# Patient Record
Sex: Female | Born: 1970 | Race: White | Hispanic: No | Smoking: Former smoker
Health system: Southern US, Community
[De-identification: ages and names within clinical notes are randomized; demographics above are authoritative.]

## PROBLEM LIST (undated history)

## (undated) DIAGNOSIS — F1020 Alcohol dependence, uncomplicated: Secondary | ICD-10-CM

## (undated) DIAGNOSIS — F32A Depression, unspecified: Secondary | ICD-10-CM

## (undated) DIAGNOSIS — G8929 Other chronic pain: Secondary | ICD-10-CM

## (undated) DIAGNOSIS — T7840XA Allergy, unspecified, initial encounter: Secondary | ICD-10-CM

## (undated) DIAGNOSIS — F329 Major depressive disorder, single episode, unspecified: Secondary | ICD-10-CM

## (undated) DIAGNOSIS — R519 Headache, unspecified: Secondary | ICD-10-CM

## (undated) DIAGNOSIS — J3489 Other specified disorders of nose and nasal sinuses: Secondary | ICD-10-CM

## (undated) DIAGNOSIS — I10 Essential (primary) hypertension: Secondary | ICD-10-CM

## (undated) DIAGNOSIS — F419 Anxiety disorder, unspecified: Secondary | ICD-10-CM

## (undated) DIAGNOSIS — F4310 Post-traumatic stress disorder, unspecified: Secondary | ICD-10-CM

## (undated) DIAGNOSIS — F1910 Other psychoactive substance abuse, uncomplicated: Secondary | ICD-10-CM

## (undated) DIAGNOSIS — C4491 Basal cell carcinoma of skin, unspecified: Secondary | ICD-10-CM

## (undated) HISTORY — PX: COLON SURGERY: SHX602

## (undated) HISTORY — DX: Alcohol dependence, uncomplicated: F10.20

## (undated) HISTORY — PX: BREAST BIOPSY: SHX20

## (undated) HISTORY — DX: Basal cell carcinoma of skin, unspecified: C44.91

## (undated) HISTORY — DX: Essential (primary) hypertension: I10

## (undated) HISTORY — DX: Anxiety disorder, unspecified: F41.9

## (undated) HISTORY — DX: Depression, unspecified: F32.A

## (undated) HISTORY — DX: Major depressive disorder, single episode, unspecified: F32.9

## (undated) HISTORY — DX: Other specified disorders of nose and nasal sinuses: J34.89

## (undated) HISTORY — DX: Allergy, unspecified, initial encounter: T78.40XA

## (undated) HISTORY — DX: Other psychoactive substance abuse, uncomplicated: F19.10

## (undated) HISTORY — DX: Other chronic pain: G89.29

## (undated) HISTORY — DX: Post-traumatic stress disorder, unspecified: F43.10

## (undated) HISTORY — DX: Headache, unspecified: R51.9

## (undated) HISTORY — PX: WISDOM TOOTH EXTRACTION: SHX21

---
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
## 2003-04-17 HISTORY — PX: BREAST EXCISIONAL BIOPSY: SUR124

## 2015-02-01 ENCOUNTER — Encounter (HOSPITAL_COMMUNITY): Payer: Self-pay | Admitting: Emergency Medicine

## 2015-02-01 ENCOUNTER — Inpatient Hospital Stay (HOSPITAL_COMMUNITY)
Admission: AD | Admit: 2015-02-01 | Discharge: 2015-02-04 | DRG: 885 | Disposition: A | Discharge status: Home / Self Care | Payer: Federal, State, Local not specified - Other | Attending: Psychiatry | Admitting: Psychiatry

## 2015-02-01 ENCOUNTER — Emergency Department (HOSPITAL_COMMUNITY)
Admission: EM | Admit: 2015-02-01 | Discharge: 2015-02-01 | Disposition: A | Discharge status: Other Acute Inpatient Hospital | Payer: MEDICAID | Attending: Emergency Medicine | Admitting: Emergency Medicine

## 2015-02-01 DIAGNOSIS — Z720 Tobacco use: Secondary | ICD-10-CM | POA: Diagnosis not present

## 2015-02-01 DIAGNOSIS — F17210 Nicotine dependence, cigarettes, uncomplicated: Secondary | ICD-10-CM | POA: Diagnosis present

## 2015-02-01 DIAGNOSIS — F1010 Alcohol abuse, uncomplicated: Secondary | ICD-10-CM | POA: Insufficient documentation

## 2015-02-01 DIAGNOSIS — Z818 Family history of other mental and behavioral disorders: Secondary | ICD-10-CM | POA: Diagnosis not present

## 2015-02-01 DIAGNOSIS — F332 Major depressive disorder, recurrent severe without psychotic features: Principal | ICD-10-CM | POA: Diagnosis present

## 2015-02-01 DIAGNOSIS — F4310 Post-traumatic stress disorder, unspecified: Secondary | ICD-10-CM | POA: Diagnosis not present

## 2015-02-01 DIAGNOSIS — F1014 Alcohol abuse with alcohol-induced mood disorder: Secondary | ICD-10-CM | POA: Diagnosis present

## 2015-02-01 DIAGNOSIS — F4312 Post-traumatic stress disorder, chronic: Secondary | ICD-10-CM | POA: Diagnosis present

## 2015-02-01 DIAGNOSIS — Z3202 Encounter for pregnancy test, result negative: Secondary | ICD-10-CM | POA: Insufficient documentation

## 2015-02-01 DIAGNOSIS — R45851 Suicidal ideations: Secondary | ICD-10-CM

## 2015-02-01 DIAGNOSIS — Y907 Blood alcohol level of 200-239 mg/100 ml: Secondary | ICD-10-CM | POA: Diagnosis present

## 2015-02-01 LAB — CBC
HCT: 43.6 % (ref 36.0–46.0)
Hemoglobin: 14.7 g/dL (ref 12.0–15.0)
MCH: 37.3 pg — ABNORMAL HIGH (ref 26.0–34.0)
MCHC: 33.7 g/dL (ref 30.0–36.0)
MCV: 110.7 fL — ABNORMAL HIGH (ref 78.0–100.0)
Platelets: 175 10*3/uL (ref 150–400)
RBC: 3.94 MIL/uL (ref 3.87–5.11)
RDW: 13.7 % (ref 11.5–15.5)
WBC: 4.7 10*3/uL (ref 4.0–10.5)

## 2015-02-01 LAB — COMPREHENSIVE METABOLIC PANEL
ALT: 56 U/L — ABNORMAL HIGH (ref 14–54)
AST: 131 U/L — ABNORMAL HIGH (ref 15–41)
Albumin: 4.9 g/dL (ref 3.5–5.0)
Alkaline Phosphatase: 75 U/L (ref 38–126)
Anion gap: 8 (ref 5–15)
BUN: 12 mg/dL (ref 6–20)
CO2: 28 mmol/L (ref 22–32)
Calcium: 9.2 mg/dL (ref 8.9–10.3)
Chloride: 104 mmol/L (ref 101–111)
Creatinine, Ser: 0.59 mg/dL (ref 0.44–1.00)
GFR calc Af Amer: >60 mL/min (ref >60)
GFR calc non Af Amer: >60 mL/min (ref >60)
Glucose, Bld: 104 mg/dL — ABNORMAL HIGH (ref 65–99)
Potassium: 4.2 mmol/L (ref 3.5–5.1)
Sodium: 140 mmol/L (ref 135–145)
Total Bilirubin: 0.9 mg/dL (ref 0.3–1.2)
Total Protein: 8.1 g/dL (ref 6.5–8.1)

## 2015-02-01 LAB — RAPID URINE DRUG SCREEN, HOSP PERFORMED
Amphetamines: NOT DETECTED
Barbiturates: NOT DETECTED
Benzodiazepines: NOT DETECTED
Cocaine: NOT DETECTED
Opiates: NOT DETECTED
Tetrahydrocannabinol: NOT DETECTED

## 2015-02-01 LAB — ETHANOL: Alcohol, Ethyl (B): 231 mg/dL — ABNORMAL HIGH (ref <5)

## 2015-02-01 LAB — HCG, QUANTITATIVE, PREGNANCY: hCG, Beta Chain, Quant, S: <1 m[IU]/mL (ref <5)

## 2015-02-01 LAB — ACETAMINOPHEN LEVEL: Acetaminophen (Tylenol), Serum: <10 ug/mL — ABNORMAL LOW (ref 10–30)

## 2015-02-01 LAB — SALICYLATE LEVEL: Salicylate Lvl: <4 mg/dL (ref 2.8–30.0)

## 2015-02-01 MED ORDER — LORAZEPAM 1 MG PO TABS: 0.0000 mg | ORAL_TABLET | Freq: Four times a day (QID) | ORAL | Status: DC

## 2015-02-01 MED ORDER — VITAMIN B-1 100 MG PO TABS: 100.0000 mg | ORAL_TABLET | Freq: Every day | ORAL | Status: DC

## 2015-02-01 MED ORDER — LORAZEPAM 1 MG PO TABS: 0.0000 mg | ORAL_TABLET | Freq: Two times a day (BID) | ORAL | Status: DC

## 2015-02-01 MED ORDER — LORAZEPAM 2 MG/ML IJ SOLN: 0.0000 mg | Freq: Two times a day (BID) | INTRAMUSCULAR | Status: DC

## 2015-02-01 MED ORDER — THIAMINE HCL 100 MG/ML IJ SOLN: 100.0000 mg | Freq: Every day | INTRAMUSCULAR | Status: DC

## 2015-02-01 MED ORDER — LORAZEPAM 2 MG/ML IJ SOLN: 0.0000 mg | Freq: Four times a day (QID) | INTRAMUSCULAR | Status: DC
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
## 2015-02-01 NOTE — ED Notes (Signed)
Pt states that she was having suicidal thoughts and needed medications ref[facility]. States that she does not feel suicidal now after she spoke with the counselor across the street. Alert and oriented. States she had 'half a liter' of wine today.

## 2015-02-01 NOTE — ED Provider Notes (Signed)
Patient initially seen and evaluated by Ivar Drapeob Browning, PA-C, for SI. [facility] results and review of results.   Lab results reviewed and patient is found to be [facility] care [facility].  Elpidio AnisShari Kiev Labrosse, PA-C 02/01/15 2112  [name], MD 02/02/15 2351

## 2015-02-01 NOTE — ED Notes (Signed)
BH called and notified all labs are back  Waiting return call

## 2015-02-01 NOTE — ED Notes (Signed)
[facility] transport called, states it will be about an hour before they can transport the patient.

## 2015-02-01 NOTE — ED Provider Notes (Signed)
CSN: [id number]     Arrival date & time 02/01/15  1825 History  By signing my name below, I, [name], attest that this documentation has been prepared under the direction and in the presence of Roxy Horseman, PA-C. Electronically Signed: [name], ED Scribe. 02/01/2015. 6:41 PM.      Chief Complaint  Patient presents with  . Suicidal   The history is provided by the patient. No language interpreter was used.   HPI Comments: Dawn Jackson is a 44 y.o. female who presents to the Emergency Department complaining of SI onset last night. She reports that she had been drinking about half a liter of wine a day for the past couple of weeks but denies any drug use. She states that her 33 year old daughter was recently kidnapped and could be somewhere in [place] but not exactly sure where. Pt recently moved here a few weeks ago from [place] to be close to family during this time. She states that this is her first time feeling this way.   No past medical history on file. Past Surgical History  Procedure Laterality Date  . Breast biopsy    . Wisdom tooth extraction     No family history on file. Social History  Substance Use Topics  . Smoking status: Current Every Day Smoker  . Smokeless tobacco: Not on file  . Alcohol Use: Yes   OB History    No data available     Review of Systems  Constitutional: Negative for fever and chills.  Respiratory: Negative for shortness of breath.   Cardiovascular: Negative for chest pain.  Gastrointestinal: Negative for nausea, vomiting, diarrhea and constipation.  Genitourinary: Negative for dysuria.  Psychiatric/Behavioral: Positive for suicidal ideas.      Allergies  Review of patient's allergies indicates no known allergies.  Home Medications   Prior to Admission medications   Not on File   BP 133/93 mmHg  Pulse 101  Temp(Src) 98.1 F (36.7 C) (Oral)  Resp 16  SpO2 100% Physical Exam  Constitutional: She is oriented to  person, place, and time. She appears well-developed and well-nourished.  HENT:  Head: Normocephalic and atraumatic.  Eyes: Conjunctivae and EOM are normal. Pupils are equal, round, and reactive to light.  Neck: Normal range of motion. Neck supple.  Cardiovascular: Normal rate and regular rhythm.  Exam reveals no gallop and no friction rub.   No murmur heard. Pulmonary/Chest: Effort normal and breath sounds normal. No respiratory distress. She has no wheezes. She has no rales. She exhibits no tenderness.  Abdominal: Soft. Bowel sounds are normal. She exhibits no distension and no mass. There is no tenderness. There is no rebound and no guarding.  Musculoskeletal: Normal range of motion. She exhibits no edema or tenderness.  Neurological: She is alert and oriented to person, place, and time.  Skin: Skin is warm and dry.  Psychiatric: She has a normal mood and affect. Her behavior is normal. Judgment and thought content normal.  Nursing note and vitals reviewed.   ED Course  Procedures (including critical care time) DIAGNOSTIC STUDIES: Oxygen Saturation is 100% on RA, normal by my interpretation.    COORDINATION OF CARE: 6:41 PM- Pt advised of plan for treatment and pt agrees. Pt has a bed at [facility] and will receive blood work here for medical clearance.    Labs Review Labs Reviewed  COMPREHENSIVE METABOLIC PANEL  ETHANOL  SALICYLATE LEVEL  ACETAMINOPHEN LEVEL  CBC  URINE RAPID DRUG SCREEN,  HOSP PERFORMED  I-STAT BETA HCG BLOOD, ED ([facility], [facility], [facility] ONLY)    Imaging Review No results found. Roxy Horsemanobert Gaelyn Tukes, PA-C has personally reviewed and evaluated these lab results as part of his medical decision-making.   EKG Interpretation None      MDM   Final diagnoses:  None   Patient with suicidal ideation. Medically clear pending reassuring blood work. [facility] Blood work is pending.  Patient will be sent back to behavioral health after  blood work.  Patient signed out to oncoming team, [name], PA-C, who will follow-up on blood work and discharge when appropriate.  I, [name], personally performed the services described in this documentation. All medical record entries made by the scribe were at my direction and in my presence.  I have reviewed the chart and discharge instructions and agree that the record reflects my personal performance and is accurate and complete. [name]  02/01/2015. 7:34 PM.      Roxy Horsemanobert Jeanetta Alonzo, PA-C 02/01/15 1934  [name], MD 02/02/15 2351

## 2015-02-02 ENCOUNTER — Encounter (HOSPITAL_COMMUNITY): Payer: Self-pay | Admitting: *Deleted

## 2015-02-02 DIAGNOSIS — F4312 Post-traumatic stress disorder, chronic: Secondary | ICD-10-CM | POA: Diagnosis present

## 2015-02-02 DIAGNOSIS — F1014 Alcohol abuse with alcohol-induced mood disorder: Secondary | ICD-10-CM

## 2015-02-02 DIAGNOSIS — F332 Major depressive disorder, recurrent severe without psychotic features: Principal | ICD-10-CM

## 2015-02-02 DIAGNOSIS — R45851 Suicidal ideations: Secondary | ICD-10-CM

## 2015-02-02 DIAGNOSIS — F4310 Post-traumatic stress disorder, unspecified: Secondary | ICD-10-CM

## 2015-02-02 MED ORDER — SERTRALINE HCL 100 MG PO TABS
100.0000 mg | ORAL_TABLET | Freq: Every day | ORAL | Status: DC
  Administered 2015-02-02 – 2015-02-04 (×3): 100 mg via ORAL
  Filled 2015-02-02 (×2): qty 1
  Filled 2015-02-02 (×2): qty 7
  Filled 2015-02-02 (×3): qty 1

## 2015-02-02 MED ORDER — LOPERAMIDE HCL 2 MG PO CAPS: 2.0000 mg | ORAL_CAPSULE | ORAL | Status: DC | PRN

## 2015-02-02 MED ORDER — ACETAMINOPHEN 325 MG PO TABS
650.0000 mg | ORAL_TABLET | Freq: Four times a day (QID) | ORAL | Status: DC | PRN
  Administered 2015-02-03 (×2): 650 mg via ORAL
  Filled 2015-02-02 (×2): qty 2

## 2015-02-02 MED ORDER — LORAZEPAM 1 MG PO TABS
1.0000 mg | ORAL_TABLET | Freq: Four times a day (QID) | ORAL | Status: AC
  Administered 2015-02-02 – 2015-02-03 (×6): 1 mg via ORAL
  Filled 2015-02-02 (×6): qty 1

## 2015-02-02 MED ORDER — LORAZEPAM 1 MG PO TABS: 1.0000 mg | ORAL_TABLET | Freq: Two times a day (BID) | ORAL | Status: DC

## 2015-02-02 MED ORDER — ADULT MULTIVITAMIN W/MINERALS CH
1.0000 | ORAL_TABLET | Freq: Every day | ORAL | Status: DC
  Administered 2015-02-02 – 2015-02-04 (×3): 1 via ORAL
  Filled 2015-02-02: qty 7
  Filled 2015-02-02 (×5): qty 1
  Filled 2015-02-02: qty 7

## 2015-02-02 MED ORDER — BOOST / RESOURCE BREEZE PO LIQD
1.0000 | Freq: Three times a day (TID) | ORAL | Status: DC
  Administered 2015-02-02 – 2015-02-03 (×4): 1 via ORAL
  Administered 2015-02-04: 0.9875 via ORAL
  Filled 2015-02-02 (×13): qty 1

## 2015-02-02 MED ORDER — NICOTINE 21 MG/24HR TD PT24
21.0000 mg | MEDICATED_PATCH | Freq: Every day | TRANSDERMAL | Status: DC
  Administered 2015-02-02 – 2015-02-04 (×3): 21 mg via TRANSDERMAL
  Filled 2015-02-02 (×6): qty 1

## 2015-02-02 MED ORDER — HYDROXYZINE HCL 25 MG PO TABS: 25.0000 mg | ORAL_TABLET | Freq: Four times a day (QID) | ORAL | Status: DC | PRN

## 2015-02-02 MED ORDER — LORAZEPAM 1 MG PO TABS
1.0000 mg | ORAL_TABLET | Freq: Four times a day (QID) | ORAL | Status: DC | PRN
Start: 2015-02-02 — End: 2015-02-04
  Administered 2015-02-02 (×2): 1 mg via ORAL
  Filled 2015-02-02 (×2): qty 1

## 2015-02-02 MED ORDER — LORAZEPAM 1 MG PO TABS: 1.0000 mg | ORAL_TABLET | Freq: Every day | ORAL | Status: DC

## 2015-02-02 MED ORDER — MAGNESIUM HYDROXIDE 400 MG/5ML PO SUSP
30.0000 mL | Freq: Every day | ORAL | Status: DC | PRN
  Administered 2015-02-03: 30 mL via ORAL
  Filled 2015-02-02: qty 30

## 2015-02-02 MED ORDER — VITAMIN B-1 100 MG PO TABS
100.0000 mg | ORAL_TABLET | Freq: Every day | ORAL | Status: DC
  Administered 2015-02-03 – 2015-02-04 (×2): 100 mg via ORAL
  Filled 2015-02-02 (×4): qty 1

## 2015-02-02 MED ORDER — ONDANSETRON 4 MG PO TBDP
4.0000 mg | ORAL_TABLET | Freq: Four times a day (QID) | ORAL | Status: DC | PRN
  Administered 2015-02-02: 4 mg via ORAL
  Filled 2015-02-02: qty 1

## 2015-02-02 MED ORDER — LORAZEPAM 1 MG PO TABS
1.0000 mg | ORAL_TABLET | Freq: Three times a day (TID) | ORAL | Status: DC
  Administered 2015-02-03: 1 mg via ORAL
  Filled 2015-02-02: qty 1

## 2015-02-02 MED ORDER — CLONIDINE HCL 0.1 MG PO TABS
0.1000 mg | ORAL_TABLET | Freq: Every day | ORAL | Status: DC
  Administered 2015-02-02 – 2015-02-03 (×2): 0.1 mg via ORAL
  Filled 2015-02-02: qty 7
  Filled 2015-02-02: qty 1
  Filled 2015-02-02: qty 7
  Filled 2015-02-02 (×2): qty 1

## 2015-02-02 MED ORDER — TRAZODONE HCL 50 MG PO TABS
50.0000 mg | ORAL_TABLET | Freq: Every evening | ORAL | Status: DC | PRN
  Administered 2015-02-02 – 2015-02-03 (×3): 50 mg via ORAL
  Filled 2015-02-02: qty 7
  Filled 2015-02-02: qty 1
  Filled 2015-02-02: qty 7
  Filled 2015-02-02: qty 1
  Filled 2015-02-02: qty 7
  Filled 2015-02-02 (×5): qty 1
  Filled 2015-02-02: qty 7

## 2015-02-02 MED ORDER — THIAMINE HCL 100 MG/ML IJ SOLN: 100.0000 mg | Freq: Once | INTRAMUSCULAR | Status: DC

## 2015-02-02 MED ORDER — MIRTAZAPINE 15 MG PO TABS
15.0000 mg | ORAL_TABLET | Freq: Every day | ORAL | Status: DC
  Administered 2015-02-02 – 2015-02-03 (×2): 15 mg via ORAL
  Filled 2015-02-02: qty 1
  Filled 2015-02-02: qty 7
  Filled 2015-02-02 (×2): qty 1
  Filled 2015-02-02: qty 7

## 2015-02-02 MED ORDER — ALUM & MAG HYDROXIDE-SIMETH 200-200-20 MG/5ML PO SUSP: 30.0000 mL | ORAL | Status: DC | PRN

## 2015-02-02 NOTE — Progress Notes (Signed)
D:  Patient's self inventory sheet, patient sleeps good, sleep medication is good.  Fair appetite, low energy level, poor concentration.  Rated depression and anxiety #8, denied hopeless.  Withdrawals of tremors, chilling, sweating.  Denied SI.  Denied pain.  Goal is to feel physically better.  Plans to talk to MD.  Plans to discharge home.  Not sure if she can afford medications after discharge.  Applied for medicaid two months ago. A:  Medications administered per MD orders.  Emotional support and encouragement given patient. R:  Denied SI and HI, contracts for safety.  Denied A/V hallucinations.  Safety maintained with 15 minute checks.

## 2015-02-02 NOTE — Tx Team (Signed)
Initial Interdisciplinary Treatment Plan   PATIENT STRESSORS: Medication change or noncompliance Substance abuse   PATIENT STRENGTHS: Ability for insight Active sense of humor Average or above average intelligence Capable of independent living Communication skills General fund of knowledge   PROBLEM LIST: Problem List/Patient Goals Date to be addressed Date deferred Reason deferred Estimated date of resolution  Depression 02/01/15     Suicidal thoughts 02/01/15     "I keep relapsing" 02/01/15                                          DISCHARGE CRITERIA:  Ability to meet basic life and health needs Improved stabilization in mood, thinking, and/or behavior Verbal commitment to aftercare and medication compliance Withdrawal symptoms are absent or subacute and managed without 24-hour nursing intervention  PRELIMINARY DISCHARGE PLAN: Attend aftercare/continuing care group Return to previous living arrangement  PATIENT/FAMIILY INVOLVEMENT: This treatment plan has been presented to and reviewed with the patient, Dawn Jackson, and/or family member, .  The patient and family have been given the opportunity to ask questions and make suggestions.  Dawn Jackson, Dawn Jackson 02/02/2015, 12:29 AM

## 2015-02-02 NOTE — [facility] Group Notes (Signed)
[facility] LCSW Group Therapy 02/02/2015  1:15 PM   Type of Therapy: Group Therapy  Participation Level: Did Not Attend. Patient invited to participate but declined.   Samuella BruinKristin Makynleigh Breslin, MSW, [employer] IncLCSWA Clinical Social Worker [facility] [phone]

## 2015-02-02 NOTE — [facility] Suicide Risk Assessment (Signed)
[facility]   Nursing information obtained from:    Demographic factors:    Current Mental Status:    Loss Factors:    Historical Factors:    Risk Reduction Factors:    Total Time spent with patient: 45 minutes Principal Problem: Severe recurrent major depression without psychotic features (HCC) Diagnosis:   Patient Active Problem List   Diagnosis Date Noted  . 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 PTSD (post-traumatic stress disorder) [F43.10] 02/02/2015  . Severe recurrent major depression without psychotic features (HCC) [F33.2] 02/02/2015     Continued Clinical Symptoms:  Alcohol Use Disorder Identification Test Final Score (AUDIT): 22 The "Alcohol Use Disorders Identification Test", Guidelines for Use in Primary Care, Second Edition.  World Science writerHealth Organization [facility](WHO). Score between 0-7:  no or low risk or alcohol related problems. Score between 8-15:  moderate risk of alcohol related problems. Score between 16-19:  high risk of alcohol related problems. Score 20 or above:  warrants further diagnostic evaluation for alcohol dependence and treatment.   CLINICAL FACTORS:   Severe Anxiety and/or Agitation Depression:   Comorbid alcohol abuse/dependence Alcohol/Substance Abuse/Dependencies  Psychiatric Specialty Exam: Physical Exam  ROS  Blood pressure 120/82, pulse 103, temperature 98.5 F (36.9 C), temperature source Oral, resp. rate 18, height 5\' 7"  (1.702 m), weight 59.875 kg (132 lb).Body mass index is 20.67 kg/(m^2).   COGNITIVE FEATURES THAT CONTRIBUTE TO RISK:  Closed-mindedness, Polarized thinking and Thought constriction (tunnel vision)    SUICIDE RISK:   Moderate:  Frequent suicidal ideation with limited intensity, and duration, some specificity in terms of plans, no associated intent, good self-control, limited dysphoria/symptomatology, some risk factors present, and identifiable protective  factors, including available and accessible social support.  PLAN OF CARE: See admission H and P  Medical Decision Making:  Review of Psycho-Social Stressors (1), Review or order clinical lab tests (1), Review of Medication Regimen & Side Effects (2) and Review of New Medication or Change in Dosage (2)  I certify that inpatient services furnished can reasonably be expected to improve the patient's condition.   Dayra Rapley A 02/02/2015, 6:13 PM

## 2015-02-02 NOTE — Progress Notes (Addendum)
Patient vomited this morning after breakfast and after taking her morning medications.  Ginger ale given to patient.   Patient presently resting in bed with eyes closed.    [email or web address]  MD informed of patient's status.  Patient continues to rest in bed with eyes closed.  Respirations even and unlabored.  No signs/symptoms of pain/distress noted on patient's face/body movements.  Safety maintained with 15 minute checks.

## 2015-02-02 NOTE — [facility] Suicide Risk Assessment (Signed)
[facility] INPATIENT:  Family/Significant Other Suicide Prevention Education  Suicide Prevention Education:  Patient Refusal for Family/Significant Other Suicide Prevention Education: The patient Dawn Jackson has refused to provide written consent for family/significant other to be provided Family/Significant Other Suicide Prevention Education during admission and/or prior to discharge.  Physician notified. SPE reviewed with patient and brochure provided. Patient encouraged to return to hospital if having suicidal thoughts, patient verbalized his/her understanding and has no further questions at this time.   Dawn Jackson, Dawn Jackson 02/02/2015, 11:58 AM

## 2015-02-02 NOTE — [facility] Group Notes (Signed)
[facility]  02/02/2015  8:45 AM  Participation Quality: Did Not Attend. Patient invited to participate but declined.  Samuella BruinKristin Lory Nowaczyk, MSW, [employer]LCSWA Clinical Social Worker [facility] [phone]
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
## 2015-02-02 NOTE — Plan of Care (Signed)
Problem: Consults Goal: Depression Patient Education See Patient Education Module for education specifics.  Outcome: Progressing Nurse discussed depression/coping skills with patient.        

## 2015-02-02 NOTE — Progress Notes (Signed)
44 year old female pt admitted on voluntary basis. Pt reports depression and suicidal thoughts and reports that she has been relapsing since April of 2015 after 4 and a half years sober. Pt also reports that she has been off of her medications for the past 5 days. On admission, pt does endorse depression but denies any SI and able to contract for safety on the unit. Pt visibly tremulous and fidgety on admission and does appear to be in withdrawal at the time. Pt was oriented to the unit and safety maintained and medication administered.
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
## 2015-02-02 NOTE — Progress Notes (Signed)
NUTRITION ASSESSMENT  Pt identified as at risk on the Malnutrition Screen Tool  INTERVENTION: 1. Supplements: Boost Breeze po TID, each supplement provides 250 kcal and 9 grams of protein  NUTRITION DIAGNOSIS: Unintentional weight loss related to sub-optimal intake as evidenced by pt report.   Goal: Pt to meet >/= 90% of their estimated nutrition needs.  Monitor:  PO intake  Assessment:  Pt admitted with ETOH abuse. Pt experiencing some N/V.  Suspect poor quality diet PTA. RD to order Boost Breeze supplements.   Height: Ht Readings from Last 1 Encounters:  02/02/15 5\' 7"  (1.702 m)    Weight: Wt Readings from Last 1 Encounters:  02/02/15 132 lb (59.875 kg)    Weight Hx: Wt Readings from Last 10 Encounters:  02/02/15 132 lb (59.875 kg)    BMI:  Body mass index is 20.67 kg/(m^2). Pt meets criteria for normal range based on current BMI.  Estimated Nutritional Needs: Kcal: 25-30 kcal/kg Protein: > 1 gram protein/kg Fluid: 1 ml/kcal  Diet Order: Diet regular Room service appropriate?: Yes; Fluid consistency:: Thin Pt is also offered choice of unit snacks mid-morning and mid-afternoon.  Pt is eating as desired.   Lab results and medications reviewed.   Tilda FrancoLindsey Kage Willmann, MS, RD, LDN Pager: [phone] After Hours Pager: [phone]

## 2015-02-03 NOTE — Progress Notes (Signed)
Pt did not attend AA group this morning.  

## 2015-02-03 NOTE — [facility] Group Notes (Signed)
[facility] LCSW Group Therapy  02/03/2015 2:36 PM  Type of Therapy:  Group Therapy  Participation Level:  Invited, did not attend.  Modes of Intervention:  Discussion, Exploration, Reality Testing and Support   Finding Balance in Life. Today's group focused on defining balance in one's own words, identifying things that can knock one off balance, and exploring healthy ways to maintain balance in life. Group members were asked to provide an example of a time when they felt off balance, describe how they handled that situation, and process healthier ways to regain balance in the future. Group members were asked to share the most important tool for maintaining balance that they learned while at [facility] and how they plan to apply this method after discharge. Finding Balance in Life. Today's group focused on defining balance in one's own words, identifying things that can knock one off balance, and exploring healthy ways to maintain balance in life. Group members were asked to provide an example of a time when they felt off balance, describe how they handled that situation, and process healthier ways to regain balance in the future. Group members were asked to share the most important tool for maintaining balance that they learned while at [facility] and how they plan to apply this method after discharge.   Summary of Progress/Problems:  Did not attend.    Dawn Jackson C 02/03/2015, 2:36 PM    

## 2015-02-03 NOTE — Progress Notes (Signed)
Pt did not attend karaoke group this evening.  

## 2015-02-03 NOTE — Tx Team (Addendum)
Interdisciplinary Treatment Plan Update (Adult) Date: 02/03/2015   Time Reviewed: 9:30 AM  Progress in Treatment: Attending groups: No Participating in groups: No Taking medication as prescribed: Yes Tolerating medication: Yes Family/Significant other contact made: No, patient has declined collateral contact Patient understands diagnosis: Yes Discussing patient identified problems/goals with staff: Yes Medical problems stabilized or resolved: Yes Denies suicidal/homicidal ideation: Yes Issues/concerns per patient self-inventory: Yes Other:  New problem(s) identified: N/A  Discharge Plan or Barriers: Home with outpatient services    Reason for Continuation of Hospitalization:  Depression Anxiety Medication Stabilization   Comments: N/A  Estimated length of stay: 1-2 days   Patient is a 44 year old female admitted for alcohol abuse and SI. Patient lives in [place]. Stressors include: alcohol abuse, finances, and separation from daughter. Patient has identified supports as: her family, boyfriend, and AA/NA community.Patient will benefit from crisis stabilization, medication evaluation, group therapy, and psycho education in addition to case management for discharge planning. Patient and CSW reviewed pt's identified goals and treatment plan. Pt verbalized understanding and agreed to treatment plan.     Review of initial/current patient goals per problem list:  1. Goal(s): Patient will participate in aftercare plan   Met: Yes   Target date: 3-5 days post admission date   As evidenced by: Patient will participate within aftercare plan AEB aftercare provider and housing plan at discharge being identified. 10/20: Goal met: Patient plans to return home to follow up with outpatient services.       2. Goal (s): Patient will exhibit decreased depressive symptoms and suicidal ideations.   Met: Adequate for discharge per MD   Target date: 3-5 days post admission  date   As evidenced by: Patient will utilize self rating of depression at 3 or below and demonstrate decreased signs of depression or be deemed stable for discharge by MD. 10/20: Goal progressing. 10/21: Adequate for discharge per MD. Patient rates depression at 4 today.      4. Goal(s): Patient will demonstrate decreased signs of withdrawal due to substance abuse   Met: Goal met   Target date: 3-5 days post admission date   As evidenced by: Patient will produce a CIWA/COWS score of 0, have stable vitals signs, and no symptoms of withdrawal 10/20: Goal not met: Pt continues to have withdrawal symptoms of anxiety, sweating, tremor and a CIWA score of a 4.  Pt to show decrease withdrawal symptoms prior to d/c. 10/21: Goal met: No withdrawal symptoms reported at this time per medical chart.      Attendees: Patient:    Family:    Physician: Dr. Parke Poisson; Dr. Sabra Heck 02/03/2015 9:30 AM  Nursing: Eulogio Bear, Grayland Ormond, Janann August, Kerby Nora ,RN 02/03/2015 9:30 AM  Clinical Social Worker: Tilden Fossa,  [place] 02/03/2015 9:30 AM  Other: Louie Bun Smart LCSWA  02/03/2015 9:30 AM  Other: Lucinda Dell, Beverly Sessions Liaison 02/03/2015 9:30 AM  Other: Lars Pinks, Case Manager 02/03/2015 9:30 AM  Other: Samuel Jester , NP 02/03/2015 9:30 AM  Other:    Other:      Scribe for Treatment Team:  Tilden Fossa, MSW, [place] [phone]

## 2015-02-03 NOTE — Progress Notes (Signed)
Patient took 1200 medications and was standing in line to go to dining room for lunch.  Patient sat on the floor, did not fall, because she felt weak.  Patient's VS were taken.  BP 146/118, P67, 100% RA.  Patient was assisted back to her room in a wheelchair.  Patient's lunch was brought to her.  Patient rested after lunch and then used the phone.    D:  Patient's self inventory sheet, patient slept good last night, sleep medication was helpful.  Good appetite, low energy level, good concentration.  Rated depression 7, hopeless 2, anxiety 6.  Withdrawals, tremors, sedation, chilling.  Denied SI.Physical problems, lightheaded.  Denied pain.  Goal is to be discharged.  Plans to speak to staff. A:  Medications administered per MD orders.  Emotional support and encouragement given patient. R:  Denied SI and HI, contracts for safety.  Denied A/V hallucinations.  Safety maintained with 15 minute checks.

## 2015-02-03 NOTE — Progress Notes (Signed)
D. Pt had been in room and in bed for much of the evening, minimal interaction or participation in milieu. Pt spoke about how she is still actively withdrawing and unable to participate this evening. Pt did appear to be tremulous, has complained chills and sweats and requested and received medication to assist withdrawals. Pt also provided with fluids and pt was seen taking fluids. Pt still endorses depression.  A. Support and encouragement provided. R. Safety maintained, will continue to monitor.

## 2015-02-03 NOTE — Progress Notes (Signed)
[facility] MD Progress Note  02/03/2015 3:05 PM Dawn Jackson  MRN:  [id number] Subjective:  Dawn Jackson is having a hard time. She was lightheaded early on this afternoon and required assistance not to fall. She continues to express commitment to not get to this point again. Dealing with a lot of shame and guilt for allowing herself to get to this point Principal Problem: Severe recurrent major depression without psychotic features [facility]) Diagnosis:   Patient Active Problem List   Diagnosis Date Noted  . Alcohol abuse with alcohol-induced mood disorder ([place]) [F10.14] 02/02/2015  . PTSD (post-traumatic stress disorder) [F43.10] 02/02/2015  . Severe recurrent major depression without psychotic features ([place]) [F33.2] 02/02/2015   Total Time spent with patient: 30 minutes  Past Psychiatric History: see admission H and P  Past Medical History: History reviewed. No pertinent past medical history.  Past Surgical History  Procedure Laterality Date  . Breast biopsy    . Wisdom tooth extraction     Family History: History reviewed. No pertinent family history. Family Psychiatric  History: See admission H and P Social History:  History  Alcohol Use  . Yes    Comment: daily usage     History  Drug Use  . Yes  . Special: Cocaine    Social History   Social History  . Marital Status: Single    Spouse Name: N/A  . Number of Children: N/A  . Years of Education: N/A   Social History Main Topics  . Smoking status: Current Every Day Smoker -- 0.50 packs/day    Types: Cigarettes  . Smokeless tobacco: None  . Alcohol Use: Yes     Comment: daily usage  . Drug Use: Yes    Special: Cocaine  . Sexual Activity: Not Asked   Other Topics Concern  . None   Social History Narrative   Additional Social History:    Pain Medications:  (none reported) Prescriptions:  (none currently but had some from [place]) Over the Counter:  (Benadryl) History of alcohol / drug use?: Yes Longest period of  sobriety (when/how long):  (4 and half years) Negative Consequences of Use: Personal relationships, Financial Withdrawal Symptoms: Agitation (unknown so going across street for clearance) Name of Substance 1:  (alcohol) 1 - Age of First Use:  (13) 1 - Amount (size/oz):  (pint of wine) 1 - Frequency:  (daily) 1 - Duration:  (past week) 1 - Last Use / Amount:  (yesterday pint wine) Name of Substance 2:  (cocaine) 2 - Age of First Use:  (29) 2 - Amount (size/oz):  (8 ball) 2 - Frequency:  (couple of weeks ago used once) 2 - Duration:  (couple weeks ago used once before that 3 months  ago) 2 - Last Use / Amount:  (two weeks ago 8 ball)                Sleep: "better"  Appetite:  Poor  Current Medications: Current Facility-Administered Medications  Medication Dose Route Frequency Provider Last Rate Last Dose  . acetaminophen (TYLENOL) tablet 650 mg  650 mg Oral Q6H PRN Laverle Hobby, PA-C   650 mg at 02/03/15 1307  . alum & mag hydroxide-simeth (MAALOX/MYLANTA) 200-200-20 MG/5ML suspension 30 mL  30 mL Oral Q4H PRN Laverle Hobby, PA-C      . cloNIDine (CATAPRES) tablet 0.1 mg  0.1 mg Oral QHS Nicholaus Bloom, MD   0.1 mg at 02/02/15 2118  . feeding supplement (BOOST / RESOURCE BREEZE) liquid  1 Container  1 Container Oral TID BM Clayton Bibles, RD   1 Container at 02/03/15 1100  . loperamide (IMODIUM) capsule 2-4 mg  2-4 mg Oral PRN Laverle Hobby, PA-C      . LORazepam (ATIVAN) tablet 1 mg  1 mg Oral Q6H PRN Laverle Hobby, PA-C   1 mg at 02/02/15 1037  . LORazepam (ATIVAN) tablet 1 mg  1 mg Oral TID Laverle Hobby, PA-C       Followed by  . [START ON 02/04/2015] LORazepam (ATIVAN) tablet 1 mg  1 mg Oral BID Laverle Hobby, PA-C       Followed by  . [START ON 02/06/2015] LORazepam (ATIVAN) tablet 1 mg  1 mg Oral Daily Spencer E Simon, PA-C      . magnesium hydroxide (MILK OF MAGNESIA) suspension 30 mL  30 mL Oral Daily PRN Laverle Hobby, PA-C      . mirtazapine (REMERON)  tablet 15 mg  15 mg Oral QHS Nicholaus Bloom, MD   15 mg at 02/02/15 2119  . multivitamin with minerals tablet 1 tablet  1 tablet Oral Daily Laverle Hobby, PA-C   1 tablet at 02/03/15 6644  . nicotine (NICODERM CQ - dosed in mg/24 hours) patch 21 mg  21 mg Transdermal Daily Nicholaus Bloom, MD   21 mg at 02/03/15 0347  . ondansetron (ZOFRAN-ODT) disintegrating tablet 4 mg  4 mg Oral Q6H PRN Laverle Hobby, PA-C   4 mg at 02/02/15 1034  . sertraline (ZOLOFT) tablet 100 mg  100 mg Oral Daily Nicholaus Bloom, MD   100 mg at 02/03/15 4259  . thiamine (B-1) injection 100 mg  100 mg Intramuscular Once Laverle Hobby, PA-C   100 mg at 02/02/15 0021  . thiamine (VITAMIN B-1) tablet 100 mg  100 mg Oral Daily Laverle Hobby, PA-C   100 mg at 02/03/15 5638  . traZODone (DESYREL) tablet 50 mg  50 mg Oral QHS,MR X 1 Laverle Hobby, PA-C   50 mg at 02/02/15 2119    Lab Results:  Results for orders placed or performed during the hospital encounter of 02/01/15 (from the past 48 hour(s))  Urine rapid drug screen (hosp performed) (Not at [facility])     Status: None   Collection Time: 02/01/15  6:33 PM  Result Value Ref Range   Opiates NONE DETECTED NONE DETECTED   Cocaine NONE DETECTED NONE DETECTED   Benzodiazepines NONE DETECTED NONE DETECTED   Amphetamines NONE DETECTED NONE DETECTED   Tetrahydrocannabinol NONE DETECTED NONE DETECTED   Barbiturates NONE DETECTED NONE DETECTED    Comment:        DRUG SCREEN FOR MEDICAL PURPOSES ONLY.  IF CONFIRMATION IS NEEDED FOR ANY PURPOSE, NOTIFY LAB WITHIN 5 DAYS.        LOWEST DETECTABLE LIMITS FOR URINE DRUG SCREEN Drug Class       Cutoff (ng/mL) Amphetamine      1000 Barbiturate      200 Benzodiazepine   756 Tricyclics       433 Opiates          300 Cocaine          300 THC              50   Comprehensive metabolic panel     Status: Abnormal   Collection Time: 02/01/15  7:38 PM  Result Value Ref Range   Sodium 140 135 - 145 mmol/L  Potassium 4.2 3.5 -  5.1 mmol/L   Chloride 104 101 - 111 mmol/L   CO2 28 22 - 32 mmol/L   Glucose, Bld 104 (H) 65 - 99 mg/dL   BUN 12 6 - 20 mg/dL   Creatinine, Ser 0.59 0.44 - 1.00 mg/dL   Calcium 9.2 8.9 - 10.3 mg/dL   Total Protein 8.1 6.5 - 8.1 g/dL   Albumin 4.9 3.5 - 5.0 g/dL   AST 131 (H) 15 - 41 U/L   ALT 56 (H) 14 - 54 U/L   Alkaline Phosphatase 75 38 - 126 U/L   Total Bilirubin 0.9 0.3 - 1.2 mg/dL   GFR calc non Af Amer >60 >60 mL/min   GFR calc Af Amer >60 >60 mL/min    Comment: (NOTE) The eGFR has been calculated using the CKD EPI equation. This calculation has not been validated in all clinical situations. eGFR's persistently <60 mL/min signify possible Chronic Kidney Disease.    Anion gap 8 5 - 15  Ethanol (ETOH)     Status: Abnormal   Collection Time: 02/01/15  7:38 PM  Result Value Ref Range   Alcohol, Ethyl (B) 231 (H) <5 mg/dL    Comment:        LOWEST DETECTABLE LIMIT FOR SERUM ALCOHOL IS 5 mg/dL FOR MEDICAL PURPOSES ONLY   Salicylate level     Status: None   Collection Time: 02/01/15  7:38 PM  Result Value Ref Range   Salicylate Lvl <6.2 2.8 - 30.0 mg/dL  Acetaminophen level     Status: Abnormal   Collection Time: 02/01/15  7:38 PM  Result Value Ref Range   Acetaminophen (Tylenol), Serum <10 (L) 10 - 30 ug/mL    Comment:        THERAPEUTIC CONCENTRATIONS VARY SIGNIFICANTLY. A RANGE OF 10-30 ug/mL MAY BE AN EFFECTIVE CONCENTRATION FOR MANY PATIENTS. HOWEVER, SOME ARE BEST TREATED AT CONCENTRATIONS OUTSIDE THIS RANGE. ACETAMINOPHEN CONCENTRATIONS >150 ug/mL AT 4 HOURS AFTER INGESTION AND >50 ug/mL AT 12 HOURS AFTER INGESTION ARE OFTEN ASSOCIATED WITH TOXIC REACTIONS.   CBC     Status: Abnormal   Collection Time: 02/01/15  7:38 PM  Result Value Ref Range   WBC 4.7 4.0 - 10.5 K/uL   RBC 3.94 3.87 - 5.11 MIL/uL   Hemoglobin 14.7 12.0 - 15.0 g/dL   HCT 43.6 36.0 - 46.0 %   MCV 110.7 (H) 78.0 - 100.0 fL   MCH 37.3 (H) 26.0 - 34.0 pg   MCHC 33.7 30.0 - 36.0 g/dL    RDW 13.7 11.5 - 15.5 %   Platelets 175 150 - 400 K/uL  hCG, quantitative, pregnancy     Status: None   Collection Time: 02/01/15  7:38 PM  Result Value Ref Range   hCG, Beta Chain, Quant, S <1 <5 mIU/mL    Comment:          GEST. AGE      CONC.  (mIU/mL)   <=1 WEEK        5 - 50     2 WEEKS       50 - 500     3 WEEKS       100 - 10,000     4 WEEKS     1,000 - 30,000     5 WEEKS     3,500 - 115,000   6-8 WEEKS     12,000 - 270,000    12 WEEKS     15,000 - 220,000  FEMALE AND NON-PREGNANT FEMALE:     LESS THAN 5 mIU/mL     Physical Findings: AIMS: Facial and Oral Movements Muscles of Facial Expression: None, normal Lips and Perioral Area: None, normal Jaw: None, normal Tongue: None, normal,Extremity Movements Upper (arms, wrists, hands, fingers): None, normal Lower (legs, knees, ankles, toes): None, normal, Trunk Movements Neck, shoulders, hips: None, normal, Overall Severity Severity of abnormal movements (highest score from questions above): None, normal Incapacitation due to abnormal movements: None, normal Patient's awareness of abnormal movements (rate only patient's report): No Awareness, Dental Status Current problems with teeth and/or dentures?: No Does patient usually wear dentures?: No  CIWA:  CIWA-Ar Total: 5 COWS:  COWS Total Score: 12  Musculoskeletal: Strength & Muscle Tone: within normal limits Gait & Station: unsteady Patient leans: normally  Psychiatric Specialty Exam: Review of Systems  Constitutional: Positive for malaise/fatigue.  HENT: Negative.   Eyes: Negative.   Respiratory: Negative.   Cardiovascular: Negative.   Gastrointestinal: Negative.   Genitourinary: Negative.   Musculoskeletal: Positive for myalgias.  Skin: Negative.   Neurological: Positive for dizziness and weakness.  Endo/Heme/Allergies: Negative.   Psychiatric/Behavioral: Positive for depression and substance abuse. The patient is nervous/anxious.     Blood pressure  107/89, pulse 110, temperature 97.9 F (36.6 C), temperature source Oral, resp. rate 18, height 5' 7"  (1.702 m), weight 59.875 kg (132 lb).Body mass index is 20.67 kg/(m^2).  General Appearance: Disheveled  Eye Sport and exercise psychologist::  Fair  Speech:  Clear and Coherent  Volume:  Decreased  Mood:  Anxious and Depressed  Affect:  Restricted  Thought Process:  Coherent and Goal Directed  Orientation:  Full (Time, Place, and Person)  Thought Content:  symptoms events worries concerns  Suicidal Thoughts:  No  Homicidal Thoughts:  No  Memory:  Immediate;   Fair Recent;   Fair Remote;   Fair  Judgement:  Fair  Insight:  Present  Psychomotor Activity:  Decreased  Concentration:  Fair  Recall:  [employer] of Knowledge:Fair  Language: Fair  Akathisia:  No  Handed:  Right  AIMS (if indicated):     Assets:  Desire for Improvement Housing Vocational/Educational  ADL's:  Intact  Cognition: WNL  Sleep:  Number of Hours: 5.75   Treatment Plan Summary: Daily contact with patient to assess and evaluate symptoms and progress in treatment and Medication management Supportive approach/coping skills Alcohol dependence; continue the Ativan detox protocol/work a relapse prevention plan Monitor hydration  Depression; continue the Zoloft at 100 mg daily PTSD; continue the Clonidine 0.1 mg HS Work with CBT/mindfulness Halen Antenucci A 02/03/2015, 3:05 PM

## 2015-02-03 NOTE — Plan of Care (Signed)
Problem: Consults Goal: Depression Patient Education See Patient Education Module for education specifics.  Outcome: Progressing Nurse discussed depression/anxiety/coping skills with patient.

## 2015-02-03 NOTE — Progress Notes (Signed)
Pt did not attend NA group this evening.  

## 2015-02-03 NOTE — Progress Notes (Signed)
The focus of this group is to educate the patient on the purpose and policies of crisis stabilization and provide a format to answer questions about their admission.  The group details unit policies and expectations of patients while admitted.  Patient did not attend 0900 nurse education orientation group this morning.  Patient stayed in bed.   

## 2015-02-04 MED ORDER — CLONIDINE HCL 0.1 MG PO TABS: 0.1000 mg | ORAL_TABLET | Freq: Every day | ORAL | Status: DC

## 2015-02-04 MED ORDER — TRAZODONE HCL 50 MG PO TABS: 50.0000 mg | ORAL_TABLET | Freq: Every evening | ORAL | Status: DC | PRN

## 2015-02-04 MED ORDER — SERTRALINE HCL 100 MG PO TABS: 100.0000 mg | ORAL_TABLET | Freq: Every day | ORAL | Status: DC

## 2015-02-04 MED ORDER — NICOTINE 21 MG/24HR TD PT24: 21.0000 mg | MEDICATED_PATCH | Freq: Every day | TRANSDERMAL | Status: DC

## 2015-02-04 MED ORDER — MIRTAZAPINE 15 MG PO TABS: 15.0000 mg | ORAL_TABLET | Freq: Every day | ORAL | Status: DC

## 2015-02-04 MED ORDER — ADULT MULTIVITAMIN W/MINERALS CH: 1.0000 | ORAL_TABLET | Freq: Every day | ORAL | Status: DC

## 2015-02-04 NOTE — Progress Notes (Signed)
Patient ID: Izell CarolinaAlexa M Smith, female   DOB: 03/06/1971, 44 y.o.   MRN: [id number] Patient being discharged to home/self care, patient left alone and transported herself via car.  Patient denies SI, HI.  All discharge plans explained to patient, medication teaching completed and medication supply and prescriptions given. All patient belonging returned including cash amounting to 5954.00.  Patient encouraged for accomplishments made during treatment.   Patient denies SI/HI.  Patient acknowledged understanding of discharge planning.  Patient verbalized that she would follow up at according to discharge planning.

## 2015-02-04 NOTE — [facility] Group Notes (Signed)
   [facility]  02/04/2015  8:45 AM   Participation Quality: Alert, Appropriate and Oriented  Mood/Affect: Anxious  Depression Rating: 4  Anxiety Rating: 4  Thoughts of Suicide: Pt denies SI/HI  Will you contract for safety? Yes  Current AVH: Pt denies  Plan for Discharge/Comments: Pt attended discharge planning group and actively participated in group. CSW provided pt with today's workbook. Patient reports feeling safe to discharge home today to follow up with outpatient services.   Transportation Means: Pt reports access to transportation  Supports: No supports mentioned at this time  Samuella BruinKristin Jamarl Pew, MSW, [employer] Inc[employer]CSWA Clinical Social Worker [facility] [phone]

## 2015-02-04 NOTE — Progress Notes (Addendum)
  [facility]  Will you be returning to the same living situation after discharge:  Yes,  Patient plans to return home At discharge, do you have transportation home?: Yes,  patient has her car at hospital Do you have the ability to pay for your medications: Yes,  patient will be provided with prescriptions  Release of information consent forms completed and in the chart;  Patient's signature needed at discharge.  Patient to Follow up at: Follow-up Information    [facility]   Specialty:  Behavioral Health   Why:  Walk-in clinic Monday-Friday between 8am to 3pm for assessment for therapy/medication management services.    Contact information:   [address] [place] [address] [phone]       Patient denies SI/HI: Yes,  denies    Safety Planning and Suicide Prevention discussed: Yes,  with patient  Have you used any form of tobacco in the last 30 days? (Cigarettes, Smokeless Tobacco, Cigars, and/or Pipes): Yes  Has patient been referred to the Quitline?: Yes, faxed on 02/04/15  Dawn Jackson, Dawn Jackson 02/04/2015, 10:09 AM

## 2015-02-04 NOTE — Progress Notes (Signed)
Pt has been in her room most of the evening.  She chose not to attend the karaoke group tonight.  Pt states she is still feeling somewhat weak and is having cramps from her menstrual period.  Pt was given a heat pack for comfort.  Pt still has visible tremors, but they do not seem to be as strong as was reported earlier in the day.  She was able to walk up to the med room and dayroom without difficulty.  Pt denies SI/HI/AVH at this time.  She states she will try to get up and participate more tomorrow.  Support and encouragement offered.  Pt makes her needs known to staff.  Discharge plans are in process.  Safety maintained with q15 minute checks.
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
## 2015-02-04 NOTE — Progress Notes (Signed)
Recreation Therapy Notes  Date: 10.21.2016 Time: 9:30am Location: [address] Group Room   Group Topic: Stress Management  Goal Area(s) Addresses:  Patient will actively participate in stress management techniques presented during session.   Behavioral Response: Did not attend.     Dawn Jackson L Dawn Jackson, LRT/CTRS        Dawn Jackson L 02/04/2015 1:36 PM 
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
## 2015-02-23 ENCOUNTER — Ambulatory Visit (INDEPENDENT_AMBULATORY_CARE_PROVIDER_SITE_OTHER): Payer: Self-pay | Admitting: Emergency Medicine

## 2015-02-23 ENCOUNTER — Ambulatory Visit (HOSPITAL_COMMUNITY)
Admission: RE | Admit: 2015-02-23 | Discharge: 2015-02-23 | Disposition: A | Discharge status: Home / Self Care | Payer: Self-pay | Source: Ambulatory Visit | Attending: Emergency Medicine | Admitting: Emergency Medicine

## 2015-02-23 VITALS — BP 127/74 | HR 85 | Temp 98.2°F | Resp 17 | Ht 65.0 in | Wt 141.0 lb

## 2015-02-23 DIAGNOSIS — J320 Chronic maxillary sinusitis: Secondary | ICD-10-CM | POA: Insufficient documentation

## 2015-02-23 DIAGNOSIS — F1020 Alcohol dependence, uncomplicated: Secondary | ICD-10-CM

## 2015-02-23 DIAGNOSIS — S060X0A Concussion without loss of consciousness, initial encounter: Secondary | ICD-10-CM

## 2015-02-23 DIAGNOSIS — F10129 Alcohol abuse with intoxication, unspecified: Secondary | ICD-10-CM

## 2015-02-23 DIAGNOSIS — IMO0001 Reserved for inherently not codable concepts without codable children: Secondary | ICD-10-CM | POA: Insufficient documentation

## 2015-02-23 DIAGNOSIS — J322 Chronic ethmoidal sinusitis: Secondary | ICD-10-CM | POA: Insufficient documentation

## 2015-02-23 DIAGNOSIS — S0990XA Unspecified injury of head, initial encounter: Secondary | ICD-10-CM | POA: Insufficient documentation

## 2015-02-23 IMAGING — CT CT HEAD W/O CM
1 of 2 series · 15 of 30 positions shown, 19 images · non-contrast
Comparison: None.

CLINICAL DATA: Patient hit head two days prior. Headache and
dizziness.

EXAM:
CT HEAD WITHOUT CONTRAST
TECHNIQUE: Contiguous axial images were obtained from the base of the skull
through the vertex without intravenous contrast.

[Series 3: headseq 2.4 h60s · axial · 0.43mm/px · z∈[-138,-15]mm · 15 of 60 slices shown, 19 images]
[im 4/60  brain]
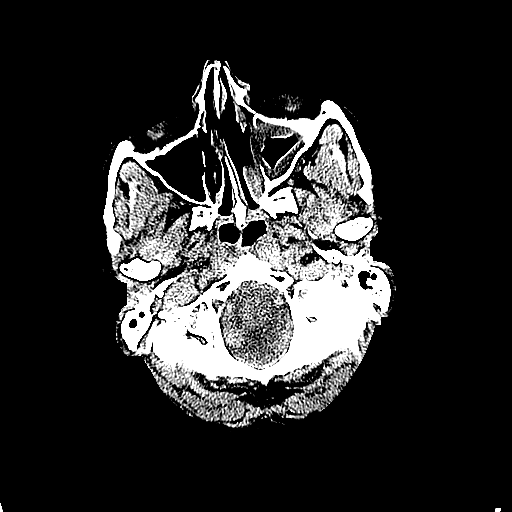
[im 4/60  bone]
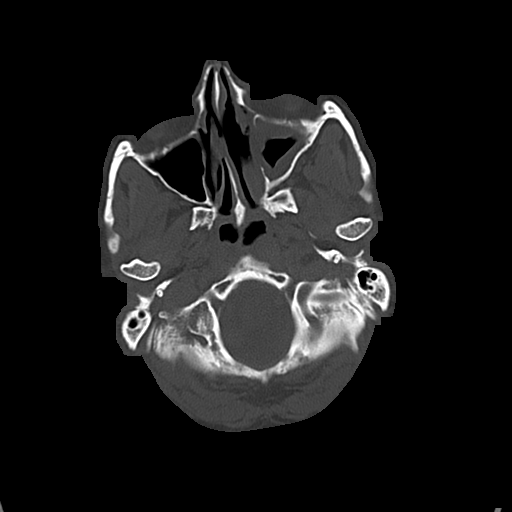
[im 7/60  brain]
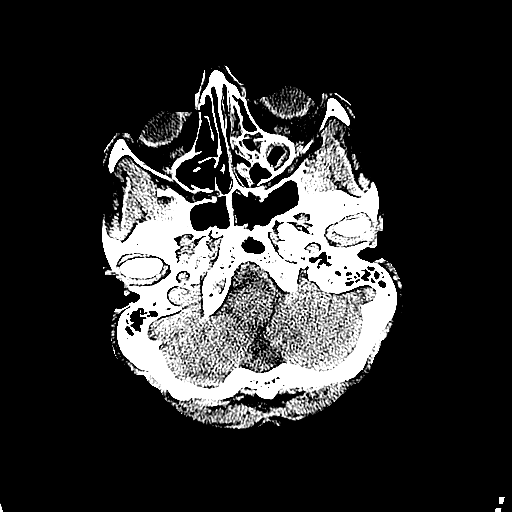
[im 13/60  brain]
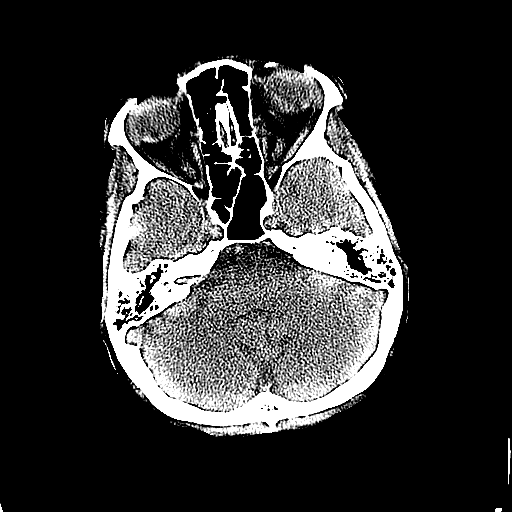
[im 16/60  brain]
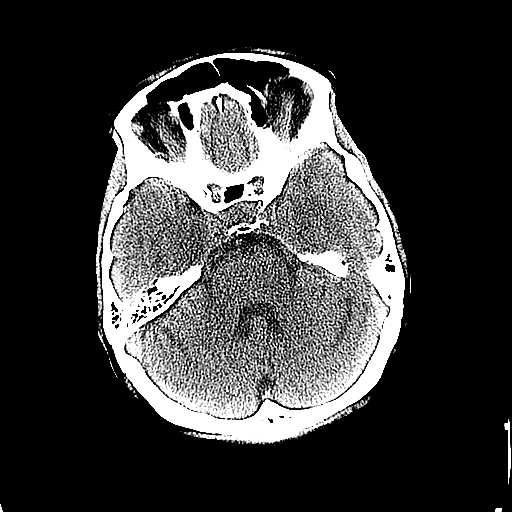
[im 19/60  brain]
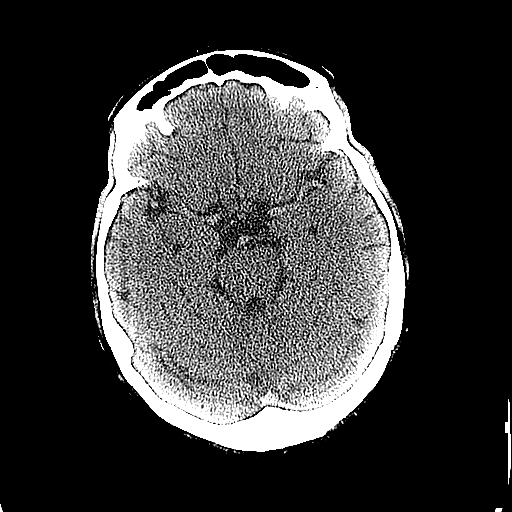
[im 19/60  bone]
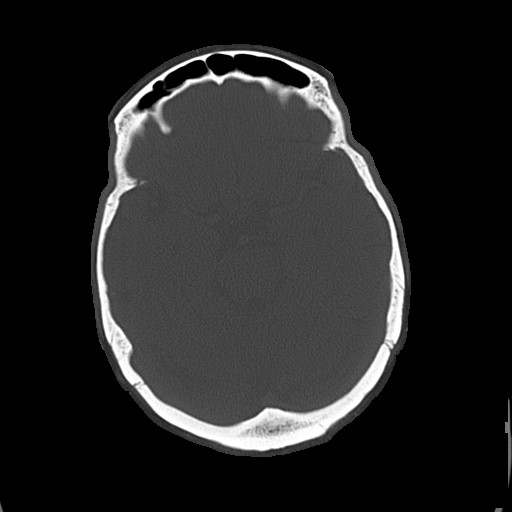
[im 22/60  brain]
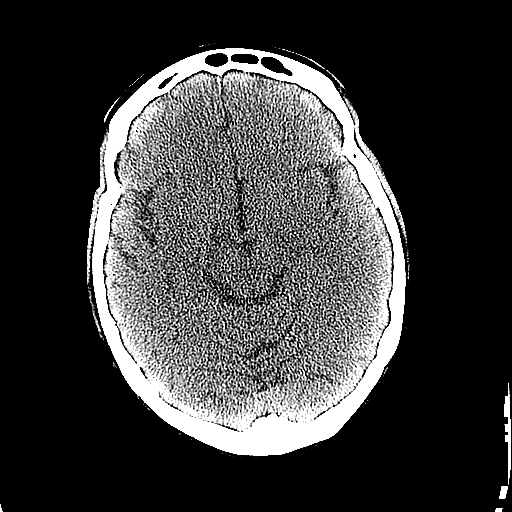
[im 25/60  brain]
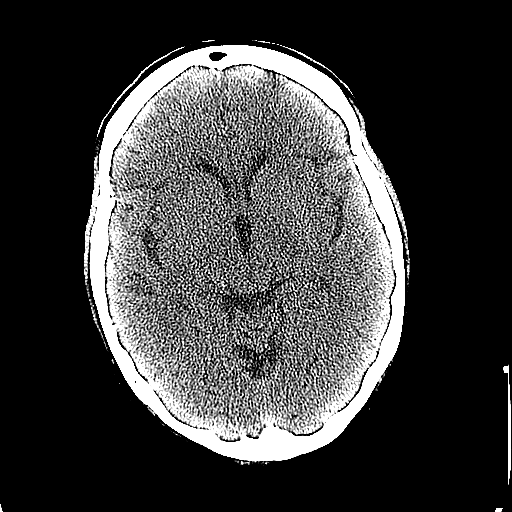
[im 32/60  brain]
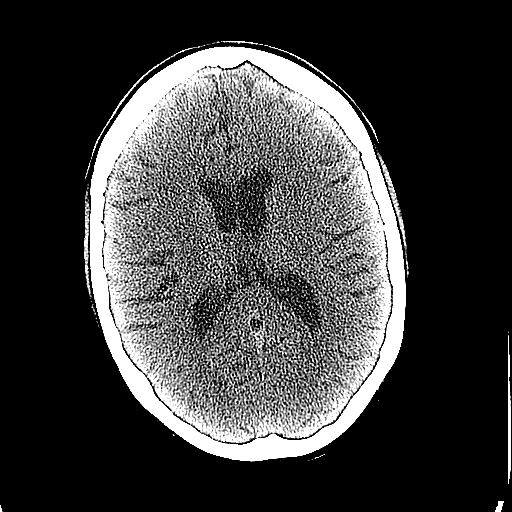
[im 35/60  brain]
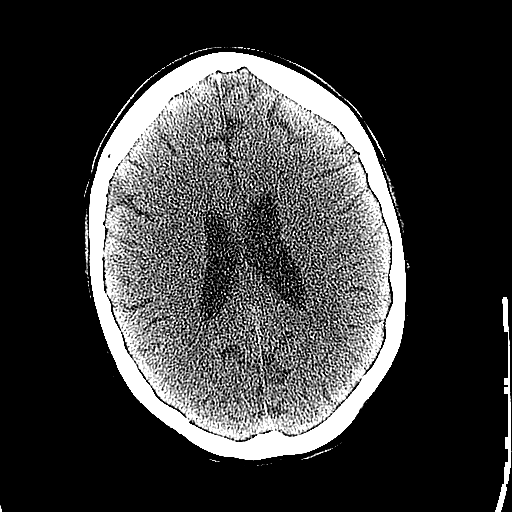
[im 35/60  bone]
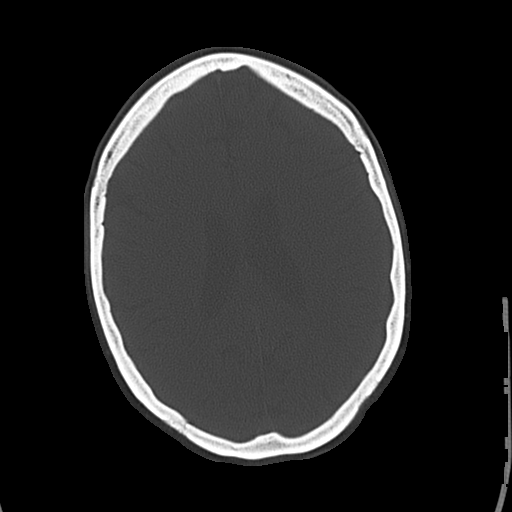
[im 38/60  brain]
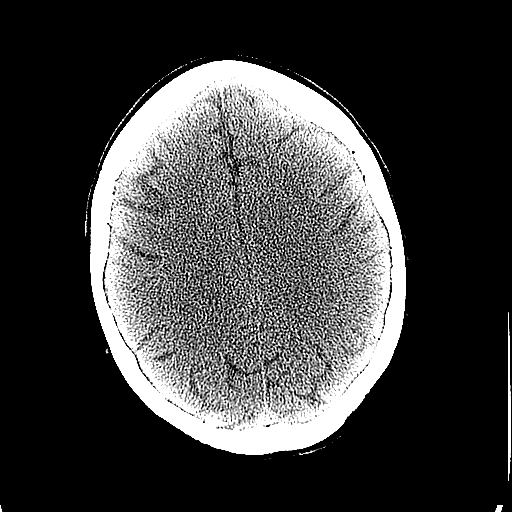
[im 41/60  brain]
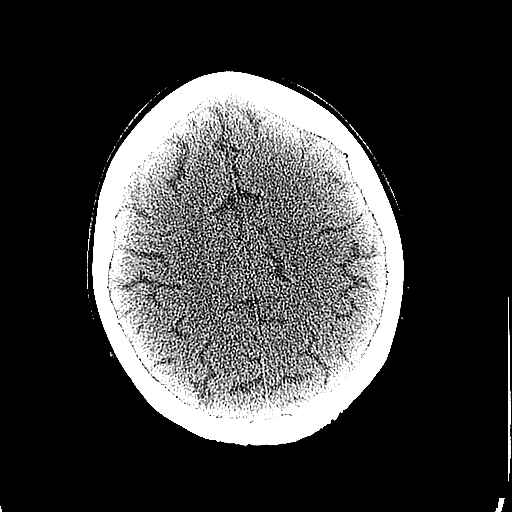
[im 44/60  brain]
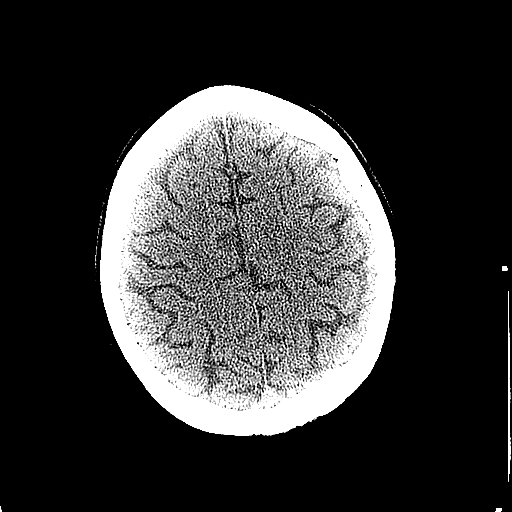
[im 50/60  brain]
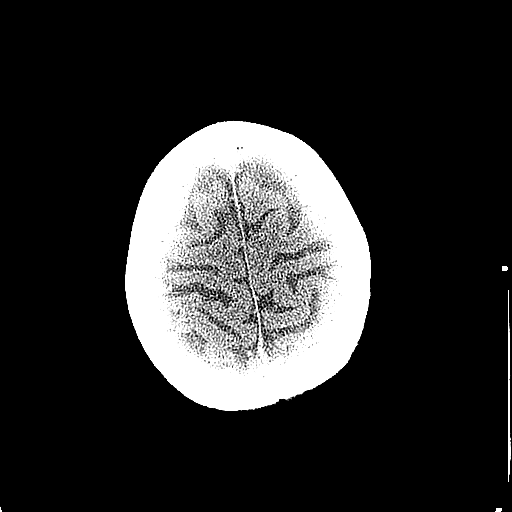
[im 50/60  bone]
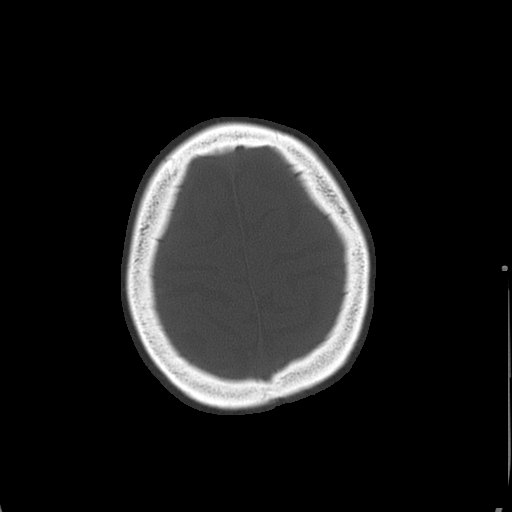
[im 53/60  brain]
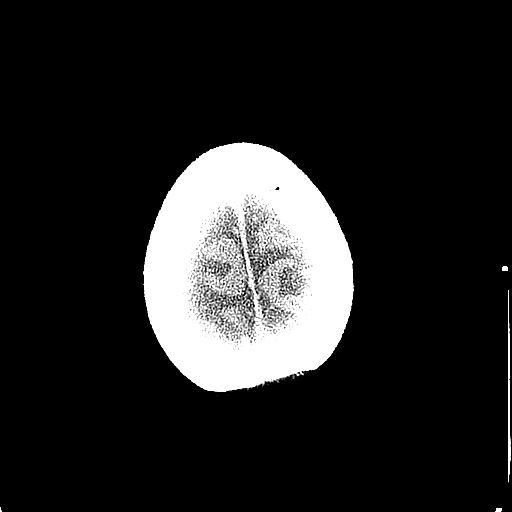
[im 56/60  brain]
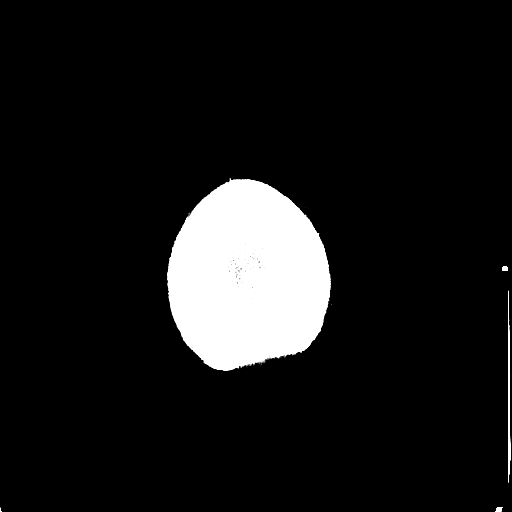

[15 of 30 positions shown; findings below may reference images not displayed]

FINDINGS: The ventricles are normal in size and configuration. Mild prominence
of the cisterna magna is an anatomic variant. There is no
intracranial mass, hemorrhage, extra-axial fluid collection, or
midline shift. The gray-white compartments appear normal. No acute
infarct evident. Bony calvarium appears intact. The mastoid air
cells are clear. There is mucosal thickening throughout much of the
left maxillary antrum. There is also opacification of several
ethmoid air cells on the left. There are no intraorbital lesions
appreciable.
IMPRESSION: Left maxillary and ethmoid paranasal sinus disease. No intracranial
mass, hemorrhage, or focal gray - white compartment lesions/acute
appearing infarct. No extra-axial fluid collection.

## 2015-02-23 NOTE — Patient Instructions (Signed)
GO OVER TO [place] MAIN ENTRANCE.  LET THEM KNOW YOU ARE THERE TO REGISTER FOR A CT SCAN.
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
## 2015-03-14 ENCOUNTER — Encounter (HOSPITAL_COMMUNITY): Payer: Self-pay | Admitting: Emergency Medicine

## 2015-03-14 ENCOUNTER — Inpatient Hospital Stay (HOSPITAL_COMMUNITY)
Admission: AD | Admit: 2015-03-14 | Discharge: 2015-03-21 | DRG: 897 | Disposition: A | Discharge status: Home / Self Care | Payer: Federal, State, Local not specified - Other | Attending: Psychiatry | Admitting: Psychiatry

## 2015-03-14 ENCOUNTER — Emergency Department (HOSPITAL_COMMUNITY)
Admission: EM | Admit: 2015-03-14 | Discharge: 2015-03-14 | Disposition: A | Discharge status: Other Acute Inpatient Hospital | Payer: PRIVATE HEALTH INSURANCE | Attending: Emergency Medicine | Admitting: Emergency Medicine

## 2015-03-14 ENCOUNTER — Encounter (HOSPITAL_COMMUNITY): Payer: Self-pay | Admitting: General Practice

## 2015-03-14 DIAGNOSIS — Z79899 Other long term (current) drug therapy: Secondary | ICD-10-CM | POA: Insufficient documentation

## 2015-03-14 DIAGNOSIS — F4310 Post-traumatic stress disorder, unspecified: Secondary | ICD-10-CM | POA: Diagnosis present

## 2015-03-14 DIAGNOSIS — F332 Major depressive disorder, recurrent severe without psychotic features: Secondary | ICD-10-CM | POA: Diagnosis present

## 2015-03-14 DIAGNOSIS — F17210 Nicotine dependence, cigarettes, uncomplicated: Secondary | ICD-10-CM | POA: Diagnosis present

## 2015-03-14 DIAGNOSIS — R609 Edema, unspecified: Secondary | ICD-10-CM

## 2015-03-14 DIAGNOSIS — F4312 Post-traumatic stress disorder, chronic: Secondary | ICD-10-CM | POA: Diagnosis present

## 2015-03-14 DIAGNOSIS — F329 Major depressive disorder, single episode, unspecified: Secondary | ICD-10-CM | POA: Insufficient documentation

## 2015-03-14 DIAGNOSIS — F1020 Alcohol dependence, uncomplicated: Principal | ICD-10-CM | POA: Diagnosis present

## 2015-03-14 DIAGNOSIS — F10230 Alcohol dependence with withdrawal, uncomplicated: Secondary | ICD-10-CM

## 2015-03-14 DIAGNOSIS — F1024 Alcohol dependence with alcohol-induced mood disorder: Secondary | ICD-10-CM | POA: Insufficient documentation

## 2015-03-14 DIAGNOSIS — R45851 Suicidal ideations: Secondary | ICD-10-CM | POA: Diagnosis present

## 2015-03-14 DIAGNOSIS — F419 Anxiety disorder, unspecified: Secondary | ICD-10-CM | POA: Insufficient documentation

## 2015-03-14 LAB — ETHANOL: Alcohol, Ethyl (B): 279 mg/dL — ABNORMAL HIGH (ref <5)

## 2015-03-14 LAB — CBC
HCT: 42.8 % (ref 36.0–46.0)
Hemoglobin: 14.8 g/dL (ref 12.0–15.0)
MCH: 37.2 pg — ABNORMAL HIGH (ref 26.0–34.0)
MCHC: 34.6 g/dL (ref 30.0–36.0)
MCV: 107.5 fL — ABNORMAL HIGH (ref 78.0–100.0)
Platelets: 114 10*3/uL — ABNORMAL LOW (ref 150–400)
RBC: 3.98 MIL/uL (ref 3.87–5.11)
RDW: 12.8 % (ref 11.5–15.5)
WBC: 3.9 10*3/uL — ABNORMAL LOW (ref 4.0–10.5)

## 2015-03-14 LAB — COMPREHENSIVE METABOLIC PANEL
ALT: 56 U/L — ABNORMAL HIGH (ref 14–54)
AST: 181 U/L — ABNORMAL HIGH (ref 15–41)
Albumin: 5 g/dL (ref 3.5–5.0)
Alkaline Phosphatase: 68 U/L (ref 38–126)
Anion gap: 13 (ref 5–15)
BUN: 15 mg/dL (ref 6–20)
CO2: 22 mmol/L (ref 22–32)
Calcium: 9 mg/dL (ref 8.9–10.3)
Chloride: 105 mmol/L (ref 101–111)
Creatinine, Ser: 0.62 mg/dL (ref 0.44–1.00)
GFR calc Af Amer: >60 mL/min (ref >60)
GFR calc non Af Amer: >60 mL/min (ref >60)
Glucose, Bld: 154 mg/dL — ABNORMAL HIGH (ref 65–99)
Potassium: 3.8 mmol/L (ref 3.5–5.1)
Sodium: 140 mmol/L (ref 135–145)
Total Bilirubin: 1 mg/dL (ref 0.3–1.2)
Total Protein: 8.1 g/dL (ref 6.5–8.1)

## 2015-03-14 LAB — RAPID URINE DRUG SCREEN, HOSP PERFORMED
Amphetamines: NOT DETECTED
Barbiturates: NOT DETECTED
Benzodiazepines: NOT DETECTED
Cocaine: NOT DETECTED
Opiates: NOT DETECTED
Tetrahydrocannabinol: NOT DETECTED

## 2015-03-14 MED ORDER — NICOTINE POLACRILEX 2 MG MT GUM: 2.0000 mg | CHEWING_GUM | OROMUCOSAL | Status: DC | PRN

## 2015-03-14 MED ORDER — LORAZEPAM 1 MG PO TABS
1.0000 mg | ORAL_TABLET | Freq: Three times a day (TID) | ORAL | Status: AC
  Administered 2015-03-16 (×3): 1 mg via ORAL
  Filled 2015-03-14 (×2): qty 1

## 2015-03-14 MED ORDER — LORAZEPAM 2 MG/ML IJ SOLN: 2.0000 mg | Freq: Once | INTRAMUSCULAR | Status: DC

## 2015-03-14 MED ORDER — TRAZODONE HCL 50 MG PO TABS
50.0000 mg | ORAL_TABLET | Freq: Every evening | ORAL | Status: DC | PRN
  Administered 2015-03-15 – 2015-03-16 (×2): 50 mg via ORAL
  Filled 2015-03-14 (×2): qty 1

## 2015-03-14 MED ORDER — LORAZEPAM 1 MG PO TABS
1.0000 mg | ORAL_TABLET | Freq: Two times a day (BID) | ORAL | Status: DC
  Filled 2015-03-14: qty 1

## 2015-03-14 MED ORDER — THIAMINE HCL 100 MG/ML IJ SOLN
100.0000 mg | Freq: Once | INTRAMUSCULAR | Status: AC
  Administered 2015-03-14: 100 mg via INTRAMUSCULAR
  Filled 2015-03-14: qty 2

## 2015-03-14 MED ORDER — ADULT MULTIVITAMIN W/MINERALS CH
1.0000 | ORAL_TABLET | Freq: Every day | ORAL | Status: DC
  Administered 2015-03-14 – 2015-03-21 (×8): 1 via ORAL
  Filled 2015-03-14 (×12): qty 1

## 2015-03-14 MED ORDER — LOPERAMIDE HCL 2 MG PO CAPS
2.0000 mg | ORAL_CAPSULE | ORAL | Status: AC | PRN
  Administered 2015-03-15: 4 mg via ORAL
  Administered 2015-03-16: 2 mg via ORAL
  Filled 2015-03-14: qty 2
  Filled 2015-03-14: qty 1

## 2015-03-14 MED ORDER — ACETAMINOPHEN 325 MG PO TABS
650.0000 mg | ORAL_TABLET | Freq: Four times a day (QID) | ORAL | Status: DC | PRN
  Administered 2015-03-16 – 2015-03-19 (×3): 650 mg via ORAL
  Filled 2015-03-14 (×3): qty 2

## 2015-03-14 MED ORDER — HYDROXYZINE HCL 25 MG PO TABS
25.0000 mg | ORAL_TABLET | Freq: Four times a day (QID) | ORAL | Status: AC | PRN
  Administered 2015-03-15 – 2015-03-16 (×2): 25 mg via ORAL
  Filled 2015-03-14 (×2): qty 1

## 2015-03-14 MED ORDER — LORAZEPAM 2 MG/ML IJ SOLN
2.0000 mg | Freq: Once | INTRAMUSCULAR | Status: AC
  Administered 2015-03-14: 2 mg via INTRAMUSCULAR
  Filled 2015-03-14: qty 1

## 2015-03-14 MED ORDER — LORAZEPAM 1 MG PO TABS: 1.0000 mg | ORAL_TABLET | Freq: Every day | ORAL | Status: DC

## 2015-03-14 MED ORDER — LORAZEPAM 1 MG PO TABS
1.0000 mg | ORAL_TABLET | Freq: Four times a day (QID) | ORAL | Status: DC
  Administered 2015-03-14 – 2015-03-15 (×5): 1 mg via ORAL
  Filled 2015-03-14 (×5): qty 1

## 2015-03-14 MED ORDER — ALUM & MAG HYDROXIDE-SIMETH 200-200-20 MG/5ML PO SUSP: 30.0000 mL | ORAL | Status: DC | PRN

## 2015-03-14 MED ORDER — ONDANSETRON 4 MG PO TBDP
4.0000 mg | ORAL_TABLET | Freq: Once | ORAL | Status: AC
  Administered 2015-03-14: 4 mg via ORAL
  Filled 2015-03-14: qty 1

## 2015-03-14 MED ORDER — VITAMIN B-1 100 MG PO TABS
100.0000 mg | ORAL_TABLET | Freq: Every day | ORAL | Status: DC
  Administered 2015-03-15 – 2015-03-21 (×7): 100 mg via ORAL
  Filled 2015-03-14 (×10): qty 1

## 2015-03-14 MED ORDER — MAGNESIUM HYDROXIDE 400 MG/5ML PO SUSP: 30.0000 mL | Freq: Every day | ORAL | Status: DC | PRN

## 2015-03-14 MED ORDER — LORAZEPAM 1 MG PO TABS
1.0000 mg | ORAL_TABLET | Freq: Four times a day (QID) | ORAL | Status: DC | PRN
  Administered 2015-03-14 – 2015-03-15 (×2): 1 mg via ORAL
  Filled 2015-03-14 (×2): qty 1

## 2015-03-14 MED ORDER — SERTRALINE HCL 100 MG PO TABS
100.0000 mg | ORAL_TABLET | Freq: Every day | ORAL | Status: DC
  Administered 2015-03-14 – 2015-03-21 (×8): 100 mg via ORAL
  Filled 2015-03-14 (×3): qty 1
  Filled 2015-03-14: qty 7
  Filled 2015-03-14 (×8): qty 1

## 2015-03-14 MED ORDER — ONDANSETRON 4 MG PO TBDP
4.0000 mg | ORAL_TABLET | Freq: Four times a day (QID) | ORAL | Status: AC | PRN
  Administered 2015-03-14: 4 mg via ORAL
  Filled 2015-03-14 (×2): qty 1

## 2015-03-14 NOTE — Tx Team (Signed)
Initial Interdisciplinary Treatment Plan   PATIENT STRESSORS: Substance abuse   PATIENT STRENGTHS: Communication skills General fund of knowledge   PROBLEM LIST: Problem List/Patient Goals Date to be addressed Date deferred Reason deferred Estimated date of resolution  Alcohol Abuse 03/14/2015           Depression 03/14/2015           Risk for Suicide 03/14/2015           "Medication Adjustment" 03/14/2015           "I want more sleep" 03/14/2015      DISCHARGE CRITERIA:  Verbal commitment to aftercare and medication compliance Withdrawal symptoms are absent or subacute and managed without 24-hour nursing intervention  PRELIMINARY DISCHARGE PLAN: Attend PHP/IOP Attend 12-step recovery group  PATIENT/FAMIILY INVOLVEMENT: This treatment plan has been presented to and reviewed with the patient, [facility].  The patient and family have been given the opportunity to ask questions and make suggestions.  [facility], [facility] 03/14/2015, 7:05 PM

## 2015-03-14 NOTE — Discharge Instructions (Signed)
Present directly to facility for detox.     Alcohol Withdrawal Alcohol withdrawal is a group of symptoms that can develop when a person who drinks heavily and regularly stops drinking or drinks less. CAUSES Heavy and regular drinking can cause chemicals that send signals from the brain to the body (neurotransmitters) to deactivate. Alcohol withdrawal develops when deactivated neurotransmitters reactivate because a person stops drinking or drinks less. RISK FACTORS The more a person drinks and the longer he or she drinks, the greater the risk of alcohol withdrawal. Severe withdrawal is more likely to develop in someone who:  Had severe alcohol withdrawal in the past.  Had a seizure during a previous episode of alcohol withdrawal.  Is elderly.  Is pregnant.  Has been abusing drugs.  Has other medical problems, including:  Infection.  Heart, lung, or liver disease.  Seizures.  Mental health problems. SYMPTOMS Symptoms of this condition can be mild to moderate, or they can be severe. Mild to moderate symptoms may include:  Fatigue.  Nightmares.  Trouble sleeping.  Depression.  Anxiety.  Inability to think clearly.  Mood swings.  Irritability.  Loss of appetite.  Nausea or vomiting.  Clammy skin.  Extreme sweating.  Rapid heartbeat.  Shakiness.  Uncontrollable shaking (tremor). Severe symptoms may include:  Fever.  Seizures.  Severeconfusion.  Feeling or seeing things that are not there (hallucinations). Symptoms usually begin within eight hours after a person stops drinking or drinks less. They can last for weeks. DIAGNOSIS Alcohol withdrawal is diagnosed with a medical history and physical exam. Sometimes, urine and blood tests are also done. TREATMENT Treatment may involve:  Monitoring blood pressure, pulse, and breathing.  Getting fluids through an IV tube.  Medicine to reduce anxiety.  Medicine to prevent or control  seizures.  Multivitamins and B vitamins.  Having a health care provider check on you daily. If symptoms are moderate to severe or if there is a risk of severe withdrawal, treatment may be done at a hospital or treatment center. HOME CARE INSTRUCTIONS  Take medicines and vitamin supplements only as directed by your health care provider.  Do not drink alcohol.  Have someone stay with you or be available if you need help.  Drink enough fluid to keep your urine clear or pale yellow.  Consider joining a 12-step program or another alcohol support group. SEEK MEDICAL CARE IF:  Your symptoms get worse or do not go away.  You cannot keep food or water in your stomach.  You are struggling with not drinking alcohol.  You cannot stop drinking alcohol. SEEK IMMEDIATE MEDICAL CARE IF:   You have an irregular heartbeat.  You have chest pain.  You have trouble breathing.  You have symptoms of severe withdrawal, such as:  A fever.  Seizures.  Severe confusion.  Hallucinations.   This information is not intended to replace advice given to you by your health care provider. Make sure you discuss any questions you have with your health care provider.   Document Released: 01/10/2005 Document Revised: 04/23/2014 Document Reviewed: 01/19/2014 Elsevier Interactive Patient Education [employer]2016 [employer]vier Inc.

## 2015-03-14 NOTE — Progress Notes (Signed)
Pt was asleep during evening AA group.
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
## 2015-03-14 NOTE — Progress Notes (Signed)
Patient ID: [facility], female   DOB: 11/18/1970, 44 y.o.   MRN: [id number]  Patient's CIWA was over 10 however writer did not administer PRN Ativan due to patient receiving a scheduled Ativan 1 mg and did receive a 2 mg IM injection in ED. Not given for safety. Patient did lay down after receiving scheduled 1800 Ativan. Patient encouraged to push fluids especially water. Patient verbalized understanding.

## 2015-03-14 NOTE — ED Provider Notes (Signed)
CSN: [id number]     Arrival date & time 03/14/15  1142 History   First MD Initiated Contact with Patient 03/14/15 1300     Chief Complaint  Patient presents with  . Medical Clearance  . detox      The history is provided by the patient. No language interpreter was used.   Dawn Jackson is a 44 y.o. female who presents to the Emergency Department complaining of medical clearance for alcohol detox.  She reports a hx of alcohol abuse, last drink two hours prior to ED arrival.  She drinks several bottles of wine daily, ongoing for the last year and a half. She states she was sober for 4 years prior to her relapse. She denies any active as I but states she would not mind if she drink herself to death and feels that she is doing not currently. She lives alone. She has a history of DTs. She feels tremulous currently. She has had nausea and vomiting today which happens when she stops drinking.she denies any hallucinations. She has an IUD and denies any chance of pregnancy.  Past Medical History  Diagnosis Date  . Anxiety   . Depression   . Substance abuse    Past Surgical History  Procedure Laterality Date  . Breast biopsy    . Wisdom tooth extraction     No family history on file. Social History  Substance Use Topics  . Smoking status: Current Every Day Smoker -- 0.30 packs/day for 6 years    Types: Cigarettes  . Smokeless tobacco: None  . Alcohol Use: Yes     Comment: daily usage 1-2 bottles of wine   OB History    No data available     Review of Systems  All other systems reviewed and are negative.     Allergies  Review of patient's allergies indicates no known allergies.  Home Medications   Prior to Admission medications   Medication Sig Start Date End Date Taking? Authorizing Provider  cloNIDine (CATAPRES) 0.1 MG tablet Take 1 tablet (0.1 mg total) by mouth at bedtime. 02/04/15   Beau Fanny, FNP  mirtazapine (REMERON) 15 MG tablet Take 1 tablet (15 mg total) by  mouth at bedtime. 02/04/15   Beau Fanny, FNP  Multiple Vitamin (MULTIVITAMIN WITH MINERALS) TABS tablet Take 1 tablet by mouth daily. Patient not taking: Reported on 02/23/2015 02/04/15   Beau Fanny, FNP  nicotine (NICODERM CQ - DOSED IN MG/24 HOURS) 21 mg/24hr patch Place 1 patch (21 mg total) onto the skin daily. Patient not taking: Reported on 02/23/2015 02/04/15   Beau Fanny, FNP  sertraline (ZOLOFT) 100 MG tablet Take 1 tablet (100 mg total) by mouth daily. 02/04/15   Beau Fanny, FNP  traZODone (DESYREL) 50 MG tablet Take 1 tablet (50 mg total) by mouth at bedtime as needed for sleep. 02/04/15   Everardo All Withrow, FNP   BP 118/60 mmHg  Pulse 94  Temp(Src) 98 F (36.7 C) (Oral)  Resp 24  SpO2 100%  LMP 02/08/2015 Physical Exam  Constitutional: She is oriented to person, place, and time. She appears well-developed and well-nourished.  HENT:  Head: Normocephalic and atraumatic.  Cardiovascular: Normal rate and regular rhythm.   No murmur heard. Pulmonary/Chest: Effort normal and breath sounds normal. No respiratory distress.  Abdominal: Soft. There is no tenderness. There is no rebound and no guarding.  Musculoskeletal: She exhibits no edema or tenderness.  Neurological: She is alert and  oriented to person, place, and time.  Mildly tremulous  Skin: Skin is warm and dry.  Psychiatric:  Tearful and anxious  Nursing note and vitals reviewed.   ED Course  Procedures (including critical care time) Labs Review Labs Reviewed  COMPREHENSIVE METABOLIC PANEL - Abnormal; Notable for the following:    Glucose, Bld 154 (*)    AST 181 (*)    ALT 56 (*)    All other components within normal limits  ETHANOL - Abnormal; Notable for the following:    Alcohol, Ethyl (B) 279 (*)    All other components within normal limits  CBC - Abnormal; Notable for the following:    WBC 3.9 (*)    MCV 107.5 (*)    MCH 37.2 (*)    Platelets 114 (*)    All other components within normal  limits  URINE RAPID DRUG SCREEN, HOSP PERFORMED    Imaging Review No results found. I have personally reviewed and evaluated these images and lab results as part of my medical decision-making.   EKG Interpretation None      MDM   Final diagnoses:  Alcohol dependence with uncomplicated withdrawal (HCC)    Patient with history of alcohol dependence here for medical clearance for alcohol treatment. LFTs minimally elevated but similar to prior, no abdominal tenderness on examination. She was tremulous on initial evaluation, resolved after Ativan 1 in the emergency department. She did have some vomiting on ED arrival, this also resolved after Zofran and Ativan. She has been medically cleared for treatment for alcohol detox.    Tilden FossaElizabeth Sky Borboa, MD 03/14/15 [phone]
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
## 2015-03-14 NOTE — BH Assessment (Signed)
Per Dr. Dub MikesLugo - patient meets criteria for inpatient hospitalization.  Per AC, (Erric) [facility].  [facility].  Patient reports that she drank a half a liter of wine before [facility].  Patient had an overwhelming scent of alcohol.   Patient denies using any drugs only alcohol.  AC Minerva Areola(Eric) spoke to the Press photographerCharge Nurse at [facility] regarding the patient coming over to be medically cleared.

## 2015-03-14 NOTE — ED Notes (Signed)
[facility] for [facility] detox.  [facility] and will be going back once cleared medically.  Pt states that she normally consumes between 1-2 bottles of wine/day. Last drink was 2 hours ago.

## 2015-03-15 ENCOUNTER — Encounter (HOSPITAL_COMMUNITY): Payer: Self-pay | Admitting: Psychiatry

## 2015-03-15 DIAGNOSIS — F10230 Alcohol dependence with withdrawal, uncomplicated: Secondary | ICD-10-CM

## 2015-03-15 DIAGNOSIS — F332 Major depressive disorder, recurrent severe without psychotic features: Secondary | ICD-10-CM | POA: Diagnosis not present

## 2015-03-15 DIAGNOSIS — F4310 Post-traumatic stress disorder, unspecified: Secondary | ICD-10-CM | POA: Diagnosis not present

## 2015-03-15 DIAGNOSIS — F1020 Alcohol dependence, uncomplicated: Secondary | ICD-10-CM

## 2015-03-15 MED ORDER — NICOTINE 21 MG/24HR TD PT24: 21.0000 mg | MEDICATED_PATCH | Freq: Every day | TRANSDERMAL | Status: DC

## 2015-03-15 MED ORDER — ENSURE ENLIVE PO LIQD
237.0000 mL | Freq: Three times a day (TID) | ORAL | Status: DC
  Administered 2015-03-15 – 2015-03-19 (×8): 237 mL via ORAL

## 2015-03-15 MED ORDER — LORAZEPAM 1 MG PO TABS
1.0000 mg | ORAL_TABLET | Freq: Four times a day (QID) | ORAL | Status: AC | PRN
  Administered 2015-03-16 (×2): 1 mg via ORAL
  Filled 2015-03-15 (×3): qty 1

## 2015-03-15 MED ORDER — NICOTINE POLACRILEX 2 MG MT GUM
2.0000 mg | CHEWING_GUM | OROMUCOSAL | Status: DC | PRN
  Administered 2015-03-16 – 2015-03-19 (×10): 2 mg via ORAL
  Administered 2015-03-19: 16:00:00 via ORAL
  Administered 2015-03-19 – 2015-03-21 (×11): 2 mg via ORAL
  Filled 2015-03-15 (×21): qty 1
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
## 2015-03-15 NOTE — Clinical Social Work Note (Signed)
[facility] (Thurs[facility]Fri). Pt has phone screening at 2:15PM on Wed 11/30 with Shayla. Pt made aware.  Trula SladeHeather Smart, MSW, LCSW Clinical Social Worker 03/15/2015 3:33 PM

## 2015-03-15 NOTE — Progress Notes (Signed)
D    Pt is anxious and having some moderate withdrawal symptoms   She requested early medications for same received them and went to bed   She is tremulous and anxious   She endorses depression A   Verbal support given    Medications administered and effectiveness monitored    Q 15 min checks   Educate on withdrawal symptoms R   Pt safe at present  Verbalizes understanding of withdrawal symptoms

## 2015-03-15 NOTE — [facility] Group Notes (Signed)
[facility] LCSW Group Therapy  03/15/2015 1:03 PM  Type of Therapy:  Group Therapy  Participation Level:  Did Not Attend-pt invited. Chose to rest in bed  Modes of Intervention:  Discussion, Education, Exploration, Problem-solving, Rapport Building, Socialization and Support  Summary of Progress/Problems: MHA Speaker came to talk about his personal journey with substance abuse and addiction. The pt processed ways by which to relate to the speaker. MHA speaker provided handouts and educational information pertaining to groups and services offered by the [facility].   Smart, Marvelle Caudill LCSW 03/15/2015, 1:03 PM

## 2015-03-15 NOTE — Progress Notes (Signed)
At approximately 0615 pt reports getting up out of bed and falling on the floor   She said she hit her knee   Pt was very sketchy with details but her gait is unsteady and she is tremulous   Examined knee but there was no red area or dark area   Then she said it wasn't anything    Pt vitals were taken   See doc flow sheets   Pt was cautioned to get up slowly and not start walking until she felt clear headed   She was put on a high fall risk and interventions implemented

## 2015-03-15 NOTE — Progress Notes (Signed)
Patient ID: [name], female   DOB: 05/09/1970, 44 y.o.   MRN: [id number]  DAR: Pt. Denies SI/HI and A/V Hallucinations. She reports sleep is good, appetite is fair, energy level is low, and concentration is poor. She rates depression, anxiety, and hopelessness 8/10. Patient does report some pain but refused medication. She did however take a heat pack for comfort. Support and encouragement provided to the patient. Scheduled medications administered to patient per physician's orders. Patients withdrawal symptoms remain moderate including tremors, anxiety, agitation, pins and needles, and sweat. Patient had a fall this morning and reports she remains unsteady on her feet. Due to high fall risk patient remained in bed mainly throughout the day. Q15 minute checks are maintained for safety.

## 2015-03-15 NOTE — Clinical Social Work Note (Signed)
[facility] and [facility] referrals sent per pt request.   Trula SladeHeather Smart, MSW, LCSW Clinical Social Worker 03/15/2015 10:45 AM

## 2015-03-15 NOTE — [facility] Group Notes (Signed)
[facility] Group Notes:  (Nursing/MHT/Case Management/Adjunct)  Date:  03/15/2015  Time:  10:39 AM  Type of Therapy:  Psychoeducational Skills  Participation Level:  Did Not Attend  Participation Quality:  N/A  Affect:  N/A  Cognitive:  N/A  Insight:  None  Engagement in Group:  None  Modes of Intervention:  Discussion and Education  Summary of Progress/Problems: Patient was invited to group but chose not to attend.   Mairin Lindsley E 03/15/2015, 10:39 AM 
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
## 2015-03-15 NOTE — Progress Notes (Signed)
NUTRITION ASSESSMENT  Pt identified as at risk on the Malnutrition Screen Tool  INTERVENTION: 1. Educated patient on the importance of nutrition and encouraged intake of food and beverages. 2. Discussed weight goals. 3. Supplements: continue Ensure Enlive TID, each supplement provides 350 kcal and 20 grams of protein  NUTRITION DIAGNOSIS: Inadequate oral intakes related to polysubstance abuse PTA as evidenced by pt report.  Goal: Pt to meet >/= 90% of their estimated nutrition needs.  Monitor:  PO intake  Assessment:  Pt seen for MST. Pt has been drinking 1.5 L/day of alcohol for the past few weeks and uses cocaine intermittently. Pt admitted with SI and detox. Per review, weight has been mainly stable x1 month.    Ensure has already been ordered TID.   44 y.o. female  Height: Ht Readings from Last 1 Encounters:  03/14/15 5\' 5"  (1.651 m)    Weight: Wt Readings from Last 1 Encounters:  03/14/15 135 lb (61.236 kg)    Weight Hx: Wt Readings from Last 10 Encounters:  03/14/15 135 lb (61.236 kg)  02/23/15 141 lb (63.957 kg)  02/02/15 132 lb (59.875 kg)    BMI:  Body mass index is 22.47 kg/(m^2). Pt meets criteria for normal weight based on current BMI.  Estimated Nutritional Needs: Kcal: 25-30 kcal/kg Protein: > 1 gram protein/kg Fluid: 1 ml/kcal  Diet Order: Diet regular Room service appropriate?: Yes; Fluid consistency:: Thin Pt is also offered choice of unit snacks mid-morning and mid-afternoon.  Pt is eating as desired.   Lab results and medications reviewed.      Trenton GammonJessica Otie Headlee, RD, LDN Inpatient Clinical Dietitian Pager # [phone] After hours/weekend pager # [phone]

## 2015-03-15 NOTE — Plan of Care (Signed)
Problem: Alteration in mood & ability to function due to Goal: STG-Patient will report withdrawal symptoms Outcome: Progressing Patient able to report withdrawal symptoms CIWA remains moderate
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
## 2015-03-15 NOTE — Progress Notes (Signed)
Adult Psychoeducational Group Note  Date:  03/15/2015 Time:  8:20 PM  Group Topic/Focus:  Wrap-Up Group:   The focus of this group is to help patients review their daily goal of treatment and discuss progress on daily workbooks.  Participation Level:  Active  Participation Quality:  Appropriate  Affect:  Appropriate  Cognitive:  Appropriate  Insight: Appropriate  Engagement in Group:  Engaged  Modes of Intervention:  Discussion  Additional Comments:  Pt rated overall day a 1 out of 10 because she couldn't stop shaking. Pt reported that she did not achieve her goal for the day, which was to stop shaking. Pt reported that the highlight of her day was getting a lot of sleep.   Dawn NeerJasmine S Mckinna Jackson 03/15/2015, 9:14 PM

## 2015-03-15 NOTE — Progress Notes (Signed)
Recreation Therapy Notes  Animal-Assisted Activity (AAA) Program Checklist/Progress Notes Patient Eligibility Criteria Checklist & Daily Group note for Rec Tx Intervention  Date: 11.29.2016 Time: 2:45pm Location: [address] Dayroom    AAA/T Program Assumption of Risk Form signed by Patient/ or Parent Legal Guardian yes  Patient is free of allergies or sever asthma yes  Patient reports no fear of animals yes  Patient reports no history of cruelty to animals yes  Patient understands his/her participation is voluntary yes  Behavioral Response: Did not attend.   Bekah Igoe L Bess Saltzman, LRT/CTRS  Betina Puckett L 03/15/2015 3:11 PM 

## 2015-03-16 DIAGNOSIS — F332 Major depressive disorder, recurrent severe without psychotic features: Secondary | ICD-10-CM | POA: Diagnosis not present

## 2015-03-16 DIAGNOSIS — F4310 Post-traumatic stress disorder, unspecified: Secondary | ICD-10-CM | POA: Diagnosis not present

## 2015-03-16 DIAGNOSIS — F10230 Alcohol dependence with withdrawal, uncomplicated: Secondary | ICD-10-CM | POA: Diagnosis not present

## 2015-03-16 MED ORDER — LORAZEPAM 1 MG PO TABS
1.0000 mg | ORAL_TABLET | Freq: Four times a day (QID) | ORAL | Status: DC
  Administered 2015-03-16 – 2015-03-18 (×7): 1 mg via ORAL
  Filled 2015-03-16 (×7): qty 1

## 2015-03-16 NOTE — Progress Notes (Signed)
Patient ID: Dawn Jackson, female   DOB: 05/11/1970, 44 y.o.   MRN: [id number]  D: Patient pleasant on approach tonight. Reports her main withdrawal symptoms is the tremors but did have some diarrhea earlier for which she got imodium for last night. She has been complaining of problems sleeping but has been lying down tonight and earlier she was in bed today. Reports depression but no active SI tonight. A: Staff will monitor on q 15 minute checks, follow treatment plan, and give meds as ordered. R: Cooperative on the unit.

## 2015-03-16 NOTE — [facility] Group Notes (Signed)
[facility]   03/16/2015 10:01 AM  Participation Quality:  Invited. Chose not to attend. Pt complains of moderate/severe withdrawals.   Smart, Kindred Reidinger LCSW

## 2015-03-16 NOTE — Plan of Care (Signed)
Problem: Alteration in mood & ability to function due to Goal: STG-Patient will comply with prescribed medication regimen (Patient will comply with prescribed medication regimen)  Outcome: Progressing Dawn Jackson is compliant with medication regimen at this time.

## 2015-03-16 NOTE — Progress Notes (Signed)
[facility] MD Progress Note  03/16/2015 6:42 PM Dawn Jackson  MRN:  [id number] Subjective:  Has staid in bed most of the day. Still feeling "shaky" dizzy with unsteady gait. Admits she is having a "rough detox" understands that if she continues to relapse, every time she detox it will be worst. She is still has no idea what she is going to do when she finishes the detox. States she cant think too far ahead right now Principal Problem: Alcohol dependence (HCC) Diagnosis:   Patient Active Problem List   Diagnosis Date Noted  . Alcohol dependence (HCC) [F10.20] 03/15/2015  . MDD (major depressive disorder) (HCC) [F32.9] 03/14/2015  . Alcoholism /alcohol abuse (HCC) [F10.20] 02/23/2015  . 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 PTSD (post-traumatic stress disorder) [F43.10] 02/02/2015  . Severe recurrent major depression without psychotic features (HCC) [F33.2] 02/02/2015   Total Time spent with patient: 20 minutes  Past Psychiatric History: see admission H and P  Past Medical History:  Past Medical History  Diagnosis Date  . Anxiety   . Depression   . Substance abuse     Past Surgical History  Procedure Laterality Date  . Breast biopsy    . Wisdom tooth extraction     Family History: History reviewed. No pertinent family history. Family Psychiatric  History: see admission H and P Social History:  History  Alcohol Use  . Yes    Comment: daily usage 1-2 bottles of wine     History  Drug Use  . Yes  . Special: Cocaine    Social History   Social History  . Marital Status: Single    Spouse Name: N/A  . Number of Children: N/A  . Years of Education: N/A   Social History Main Topics  . Smoking status: Current Every Day Smoker -- 1.00 packs/day for 6 years    Types: Cigarettes  . Smokeless tobacco: None  . Alcohol Use: Yes     Comment: daily usage 1-2 bottles of wine  . Drug Use: Yes    Special: Cocaine  . Sexual Activity: No   Other  Topics Concern  . None   Social History Narrative   Additional Social History:                         Sleep: Poor  Appetite:  Poor  Current Medications: Current Facility-Administered Medications  Medication Dose Route Frequency Provider Last Rate Last Dose  . acetaminophen (TYLENOL) tablet 650 mg  650 mg Oral Q6H PRN Oneta Rack, NP      . alum & mag hydroxide-simeth (MAALOX/MYLANTA) 200-200-20 MG/5ML suspension 30 mL  30 mL Oral Q4H PRN Oneta Rack, NP      . feeding supplement (ENSURE ENLIVE) (ENSURE ENLIVE) liquid 237 mL  237 mL Oral TID BM Rachael Fee, MD   237 mL at 03/16/15 1341  . hydrOXYzine (ATARAX/VISTARIL) tablet 25 mg  25 mg Oral Q6H PRN Oneta Rack, NP   25 mg at 03/15/15 0619  . loperamide (IMODIUM) capsule 2-4 mg  2-4 mg Oral PRN Oneta Rack, NP   4 mg at 03/15/15 2217  . [START ON 03/17/2015] LORazepam (ATIVAN) tablet 1 mg  1 mg Oral BID Oneta Rack, NP       Followed by  . [START ON 03/18/2015] LORazepam (ATIVAN) tablet 1 mg  1 mg Oral Daily Oneta Rack, NP      .  LORazepam (ATIVAN) tablet 1 mg  1 mg Oral Q6H PRN Rachael Fee, MD   1 mg at 03/16/15 0058  . magnesium hydroxide (MILK OF MAGNESIA) suspension 30 mL  30 mL Oral Daily PRN Oneta Rack, NP      . multivitamin with minerals tablet 1 tablet  1 tablet Oral Daily Oneta Rack, NP   1 tablet at 03/16/15 0805  . nicotine polacrilex (NICORETTE) gum 2 mg  2 mg Oral PRN Rachael Fee, MD      . ondansetron (ZOFRAN-ODT) disintegrating tablet 4 mg  4 mg Oral Q6H PRN Oneta Rack, NP   4 mg at 03/14/15 2042  . sertraline (ZOLOFT) tablet 100 mg  100 mg Oral Daily Oneta Rack, NP   100 mg at 03/16/15 0805  . thiamine (VITAMIN B-1) tablet 100 mg  100 mg Oral Daily Oneta Rack, NP   100 mg at 03/16/15 0805  . traZODone (DESYREL) tablet 50 mg  50 mg Oral QHS PRN Oneta Rack, NP   50 mg at 03/15/15 2218    Lab Results: No results found for this or any previous visit (from the past  48 hour(s)).  Physical Findings: AIMS: Facial and Oral Movements Muscles of Facial Expression: None, normal Lips and Perioral Area: None, normal Jaw: None, normal Tongue: None, normal,Extremity Movements Upper (arms, wrists, hands, fingers): None, normal Lower (legs, knees, ankles, toes): None, normal, Trunk Movements Neck, shoulders, hips: None, normal, Overall Severity Severity of abnormal movements (highest score from questions above): None, normal Incapacitation due to abnormal movements: None, normal Patient's awareness of abnormal movements (rate only patient's report): No Awareness, Dental Status Current problems with teeth and/or dentures?: Yes (reports she has a cavity) Does patient usually wear dentures?: No  CIWA:  CIWA-Ar Total: 6 COWS:     Musculoskeletal: Strength & Muscle Tone: within normal limits Gait & Station: unsteady Patient leans: normal  Psychiatric Specialty Exam: Review of Systems  Constitutional: Positive for malaise/fatigue.  Eyes: Negative.   Respiratory: Negative.   Cardiovascular: Negative.   Gastrointestinal: Positive for nausea.  Genitourinary: Negative.   Musculoskeletal: Negative.   Skin: Negative.   Neurological: Positive for dizziness, tremors, weakness and headaches.  Endo/Heme/Allergies: Negative.   Psychiatric/Behavioral: Positive for substance abuse. The patient is nervous/anxious and has insomnia.     Blood pressure 112/78, pulse 85, temperature 98.7 F (37.1 C), temperature source Oral, resp. rate 16, height  (1.651 m), weight 61.236 kg (135 lb), last menstrual period 02/08/2015, SpO2 99 %.Body mass index is 22.47 kg/(m^2).  General Appearance: Disheveled  Eye Solicitor::  Fair  Speech:  Clear and Coherent  Volume:  Decreased  Mood:  Anxious, Depressed and "shaky, sick"  Affect:  Restricted  Thought Process:  Coherent and Goal Directed  Orientation:  Full (Time, Place, and Person)  Thought Content:  symptoms events worries  concerns  Suicidal Thoughts:  No  Homicidal Thoughts:  No  Memory:  Immediate;   Fair Recent;   Fair Remote;   Fair  Judgement:  Fair  Insight:  Present and Shallow  Psychomotor Activity:  Restlessness  Concentration:  Fair  Recall:  [employer] of Knowledge:Fair  Language: Fair  Akathisia:  No  Handed:  Right  AIMS (if indicated):     Assets:  Desire for Improvement  ADL's:  Intact  Cognition: WNL  Sleep:  Number of Hours: 2.75   Treatment Plan Summary: Daily contact with patient to assess and evaluate  symptoms and progress in treatment and Medication management Supportive approach/coping skills Alcohol dependence/withdrawal; will slow the detox as she is still having acute symptoms/work a relapse prevention plan Continue the Ativan 1 mg QID for one more day Insomnia; will increase the Trazodone to 100 mg HS Will explore residential treatment options if she was to want to do it  Maksymilian Mabey A 03/16/2015, 6:42 PM

## 2015-03-16 NOTE — Progress Notes (Signed)
Patient ID: Dawn Jackson, female   DOB: 08/06/1970, 44 y.o.   MRN: [id number]  DAR: Pt. Denies SI/HI and A/V Hallucinations. Patient does not report any pain at this time. She does report withdrawals including tremors, sweats, and anxiety. Patient appears less tremulous than yesterday and more steady on her feet. She was able to go to cafeteria for dinner without issues. Patient's appetite is very poor, mainly only drinking ensure. She reports energy level is low, concentration is poor, and sleep is poor. She rates depression and anxiety 9/10, and hopelessness 5/10. She reports that her goal today is to talk to her sister on the phone and she did reach the goal. Support and encouragement provided to the patient. She was encouraged to try to go to a group however patient remained in bed due to not feeling well. Scheduled medications administered to patient per physician's orders. Q15 minute checks are maintained for safety.

## 2015-03-16 NOTE — Progress Notes (Signed)
Pt did not attend wrap up group this evening.  

## 2015-03-16 NOTE — Progress Notes (Signed)
Recreation Therapy Notes  Date: 11.30.2016 Time: 9:30am Location: [address] Group Room  Group Topic: Stress Management  Goal Area(s) Addresses:  Patient will actively participate in stress management techniques presented during session.   Behavioral Response: Did not attend.   Marykay Lexenise L Fay Swider, LRT/CTRS        Landis Dowdy L 03/16/2015 7:23 PM

## 2015-03-16 NOTE — [facility] Group Notes (Signed)
[facility] LCSW Group Therapy  03/16/2015 12:48 PM  Type of Therapy:  Group Therapy  Participation Level:  Did Not Attend-pt reports severe withdrawals. Chose to remain in bed.   Modes of Intervention:  Confrontation, Discussion, Education, Exploration, Problem-solving, Rapport Building, Socialization and Support  Summary of Progress/Problems: Today's Topic: Overcoming Obstacles. Patients identified one short term goal and potential obstacles in reaching this goal. Patients processed barriers involved in overcoming these obstacles. Patients identified steps necessary for overcoming these obstacles and explored motivation (internal and external) for facing these difficulties head on.   Smart, Rosabelle Jupin LCSW  03/16/2015, 12:48 PM

## 2015-03-17 DIAGNOSIS — F4310 Post-traumatic stress disorder, unspecified: Secondary | ICD-10-CM | POA: Diagnosis not present

## 2015-03-17 DIAGNOSIS — F1020 Alcohol dependence, uncomplicated: Secondary | ICD-10-CM | POA: Diagnosis present

## 2015-03-17 LAB — GLUCOSE, CAPILLARY: Glucose-Capillary: 118 mg/dL — ABNORMAL HIGH (ref 65–99)

## 2015-03-17 MED ORDER — ONDANSETRON 4 MG PO TBDP
4.0000 mg | ORAL_TABLET | Freq: Four times a day (QID) | ORAL | Status: AC | PRN
  Administered 2015-03-17: 4 mg via ORAL

## 2015-03-17 MED ORDER — LOPERAMIDE HCL 2 MG PO CAPS
2.0000 mg | ORAL_CAPSULE | ORAL | Status: AC | PRN
  Administered 2015-03-19: 2 mg via ORAL
  Filled 2015-03-17: qty 1

## 2015-03-17 MED ORDER — TRAZODONE HCL 100 MG PO TABS
100.0000 mg | ORAL_TABLET | Freq: Every day | ORAL | Status: DC
  Administered 2015-03-17 – 2015-03-18 (×2): 100 mg via ORAL
  Filled 2015-03-17 (×5): qty 1

## 2015-03-17 MED ORDER — GABAPENTIN 100 MG PO CAPS
100.0000 mg | ORAL_CAPSULE | Freq: Three times a day (TID) | ORAL | Status: DC
  Administered 2015-03-17 – 2015-03-18 (×3): 100 mg via ORAL
  Filled 2015-03-17 (×8): qty 1

## 2015-03-17 MED ORDER — HYDROXYZINE HCL 25 MG PO TABS
25.0000 mg | ORAL_TABLET | Freq: Four times a day (QID) | ORAL | Status: AC | PRN
  Administered 2015-03-18 – 2015-03-19 (×2): 25 mg via ORAL
  Filled 2015-03-17 (×3): qty 1

## 2015-03-17 NOTE — Progress Notes (Signed)
[facility] MD Progress Note  03/17/2015 3:03 PM Dawn Jackson  MRN:  [id number] Subjective:  Pt states " I am fine , I have tremors and I am still withdrawing. But I am better than yesterday.'  Objective:Patient seen and chart reviewed.Discussed patient with treatment team. Pt is a 6 y ol CF , who has a past hx of depression, PTSD , presented after abusing alcohol. Pt today appears to be in withdrawal, stays in bed mostly - stays tremulous and lethargic. Pt has been making use of her PRN medications and reports tolerating them well. Pt reports sleep as decreased. Discussed Trazodone - which she is willing to take. Pt per staff - continues to be compliant on medications. No disruptive issues noted on the unit.   Principal Problem: Alcohol use disorder, severe, dependence (HCC) Diagnosis:   Patient Active Problem List   Diagnosis Date Noted  . Alcohol use disorder, severe, dependence (HCC) [F10.20] 03/17/2015  . PTSD (post-traumatic stress disorder) [F43.10] 02/02/2015  . Severe recurrent major depression without psychotic features (HCC) [F33.2] 02/02/2015   Total Time spent with patient: 30 minutes  Past Psychiatric History: see admission H and P  Past Medical History:  Past Medical History  Diagnosis Date  . Anxiety   . Depression   . Substance abuse     Past Surgical History  Procedure Laterality Date  . Breast biopsy    . Wisdom tooth extraction     Family History: Pt did not express any. Family Psychiatric  History: see admission H and P Social History:  History  Alcohol Use  . Yes    Comment: daily usage 1-2 bottles of wine     History  Drug Use  . Yes  . Special: Cocaine    Social History   Social History  . Marital Status: Single    Spouse Name: N/A  . Number of Children: N/A  . Years of Education: N/A   Social History Main Topics  . Smoking status: Current Every Day Smoker -- 1.00 packs/day for 6 years    Types: Cigarettes  . Smokeless tobacco: None  .  Alcohol Use: Yes     Comment: daily usage 1-2 bottles of wine  . Drug Use: Yes    Special: Cocaine  . Sexual Activity: No   Other Topics Concern  . None   Social History Narrative   Additional Social History:                         Sleep: Poor  Appetite:  Poor  Current Medications: Current Facility-Administered Medications  Medication Dose Route Frequency Provider Last Rate Last Dose  . acetaminophen (TYLENOL) tablet 650 mg  650 mg Oral Q6H PRN Oneta Rack, NP   650 mg at 03/16/15 2358  . alum & mag hydroxide-simeth (MAALOX/MYLANTA) 200-200-20 MG/5ML suspension 30 mL  30 mL Oral Q4H PRN Oneta Rack, NP      . feeding supplement (ENSURE ENLIVE) (ENSURE ENLIVE) liquid 237 mL  237 mL Oral TID BM Rachael Fee, MD   237 mL at 03/16/15 1955  . gabapentin (NEURONTIN) capsule 100 mg  100 mg Oral TID Jomarie Longs, MD      . hydrOXYzine (ATARAX/VISTARIL) tablet 25 mg  25 mg Oral Q6H PRN Oneta Rack, NP   25 mg at 03/16/15 2308  . loperamide (IMODIUM) capsule 2-4 mg  2-4 mg Oral PRN Oneta Rack, NP   2 mg at  03/16/15 2101  . LORazepam (ATIVAN) tablet 1 mg  1 mg Oral Q6H PRN Rachael FeeIrving A Lugo, MD   1 mg at 03/16/15 2358  . LORazepam (ATIVAN) tablet 1 mg  1 mg Oral QID Rachael FeeIrving A Lugo, MD   1 mg at 03/17/15 1159  . magnesium hydroxide (MILK OF MAGNESIA) suspension 30 mL  30 mL Oral Daily PRN Oneta Rackanika N Lewis, NP      . multivitamin with minerals tablet 1 tablet  1 tablet Oral Daily Oneta Rackanika N Lewis, NP   1 tablet at 03/17/15 [id number]  . nicotine polacrilex (NICORETTE) gum 2 mg  2 mg Oral PRN Rachael FeeIrving A Lugo, MD   2 mg at 03/17/15 1201  . ondansetron (ZOFRAN-ODT) disintegrating tablet 4 mg  4 mg Oral Q6H PRN Oneta Rackanika N Lewis, NP   4 mg at 03/14/15 2042  . sertraline (ZOLOFT) tablet 100 mg  100 mg Oral Daily Oneta Rackanika N Lewis, NP   100 mg at 03/17/15 [id number]  . thiamine (VITAMIN B-1) tablet 100 mg  100 mg Oral Daily Oneta Rackanika N Lewis, NP   100 mg at 03/17/15 [id number]  . traZODone (DESYREL) tablet 100  mg  100 mg Oral QHS Jomarie LongsSaramma Kahmari Herard, MD        Lab Results: No results found for this or any previous visit (from the past 48 hour(s)).  Physical Findings: AIMS: Facial and Oral Movements Muscles of Facial Expression: None, normal Lips and Perioral Area: None, normal Jaw: None, normal Tongue: None, normal,Extremity Movements Upper (arms, wrists, hands, fingers): None, normal Lower (legs, knees, ankles, toes): None, normal, Trunk Movements Neck, shoulders, hips: None, normal, Overall Severity Severity of abnormal movements (highest score from questions above): None, normal Incapacitation due to abnormal movements: None, normal Patient's awareness of abnormal movements (rate only patient's report): No Awareness, Dental Status Current problems with teeth and/or dentures?: Yes Does patient usually wear dentures?: No  CIWA:  CIWA-Ar Total: 1 COWS:     Musculoskeletal: Strength & Muscle Tone: within normal limits Gait & Station: unsteady Patient leans: normal  Psychiatric Specialty Exam: Review of Systems  Constitutional: Positive for malaise/fatigue.  Eyes: Negative.   Respiratory: Negative.   Cardiovascular: Negative.   Gastrointestinal: Positive for nausea.  Genitourinary: Negative.   Musculoskeletal: Negative.   Skin: Negative.   Neurological: Positive for tremors and weakness.  Endo/Heme/Allergies: Negative.   Psychiatric/Behavioral: Positive for substance abuse. The patient is nervous/anxious and has insomnia.   All other systems reviewed and are negative.   Blood pressure 99/69, pulse 65, temperature 97.8 F (36.6 C), temperature source Oral, resp. rate 18, height 5\' 5"  (1.651 m), weight 61.236 kg (135 lb), last menstrual period 02/08/2015, SpO2 99 %.Body mass index is 22.47 kg/(m^2).  General Appearance: Disheveled  Eye SolicitorContact::  Fair  Speech:  Clear and Coherent  Volume:  Decreased  Mood:  Anxious and Depressed  Affect:  Restricted  Thought Process:  Goal Directed   Orientation:  Full (Time, Place, and Person)  Thought Content:  Rumination  Suicidal Thoughts:  No  Homicidal Thoughts:  No  Memory:  Immediate;   Fair Recent;   Fair Remote;   Fair  Judgement:  Fair  Insight:  Shallow  Psychomotor Activity:  Restlessness and Tremor  Concentration:  Fair  Recall:  [employer]  Fund of Knowledge:Fair  Language: Fair  Akathisia:  No  Handed:  Right  AIMS (if indicated):     Assets:  Desire for Improvement  ADL's:  Intact  Cognition: WNL  Sleep:  Number of Hours: 2.75   Treatment Plan Summary:Pt continues to be depressed, lethargic , withdrawn , has shakes and is not sleeping. Will readjust medications. Continue inpatient stabilization.  Daily contact with patient to assess and evaluate symptoms and progress in treatment and Medication management Supportive approach/coping skills Will continue Dr.Lugo's plan except where noted below. Alcohol dependence/withdrawal; will slow the detox as she is still having acute symptoms/work a relapse prevention plan Continue the Ativan 1 mg QID for one more day- since she continues to be tremulous. Could taper down slowly.= Insomnia; Increase Trazodone to 100 mg HS Add Gabapentin 100 mg po tid for anxiety sx. CSW will work on disposition - referral to substance abuse program as needed.  Annisa Mazzarella MD 03/17/2015, 3:03 PM

## 2015-03-17 NOTE — Progress Notes (Signed)
Since receiving her bedtime medications, pt has been to the medication window a couple of times asking for prn medications for sleep.  She received Vistaril shortly after 2300, then got back up stating that she was having racing thoughts and c/o hip pain from the thin mattress.  Pt was given prn Ativan 1 mg for her anxiety and Tylenol for the pain. Pt returned to her room without incident.

## 2015-03-17 NOTE — Progress Notes (Signed)
Pt reports that she is still having alcohol withdrawal symptoms.  She says she is feeling anxious and having episodes of diarrhea.  She denies SI/HI/AVH at this time.  She has been mostly in her room, but has come to the dayroom for minutes at a time.  She appears to be restless and fidgety.  She says she will mostly likely go for long term treatment after her detox.  Support and encouragement offered.  Pt has been pleasant and cooperative.  PRN medications given as ordered.  Safety maintained with q15 minute checks.

## 2015-03-17 NOTE — Progress Notes (Signed)
DAR NOTE: Patient presents with anxious affect and depressed mood.  Denies pain, auditory and visual hallucinations.  Reports withdrawal symptoms of tremors, diarrhea, cravings, chilling, cramping, and nausea on self inventory form.  Reports energy level as low and concentration level as good.  Rates depression at 9, hopelessness at 5, and anxiety at 9.  Maintained on routine safety checks.  Medications given as prescribed.  Support and encouragement offered as needed.  States goal for today is "take care of tremors."  Patient remained in her room sleeping.  Patient encouraged to attend groups.

## 2015-03-17 NOTE — [facility] Group Notes (Signed)
[facility] Group Notes:  (Nursing/MHT/Case Management/Adjunct)  Date:  03/17/2015  Time: 1200 Type of Therapy:  Nurse Education  Participation Level:  Did Not Attend  Dawn Jackson 03/17/2015, 6:58 PM

## 2015-03-18 DIAGNOSIS — F1020 Alcohol dependence, uncomplicated: Secondary | ICD-10-CM | POA: Diagnosis not present

## 2015-03-18 MED ORDER — NICOTINE POLACRILEX 2 MG MT GUM
CHEWING_GUM | OROMUCOSAL | Status: AC
  Administered 2015-03-18: 19:00:00
  Filled 2015-03-18: qty 1

## 2015-03-18 MED ORDER — GABAPENTIN 100 MG PO CAPS
200.0000 mg | ORAL_CAPSULE | Freq: Three times a day (TID) | ORAL | Status: DC
  Administered 2015-03-18 – 2015-03-21 (×9): 200 mg via ORAL
  Filled 2015-03-18 (×3): qty 2
  Filled 2015-03-18: qty 42
  Filled 2015-03-18 (×6): qty 2
  Filled 2015-03-18 (×2): qty 42
  Filled 2015-03-18 (×2): qty 2

## 2015-03-18 MED ORDER — LORAZEPAM 1 MG PO TABS
1.0000 mg | ORAL_TABLET | Freq: Three times a day (TID) | ORAL | Status: DC
  Administered 2015-03-18 – 2015-03-21 (×9): 1 mg via ORAL
  Filled 2015-03-18 (×2): qty 1
  Filled 2015-03-18: qty 2
  Filled 2015-03-18 (×7): qty 1

## 2015-03-18 MED ORDER — LORAZEPAM 1 MG PO TABS
1.0000 mg | ORAL_TABLET | Freq: Three times a day (TID) | ORAL | Status: DC
Start: 2015-03-19 — End: 2015-03-18

## 2015-03-18 MED ORDER — IBUPROFEN 600 MG PO TABS
600.0000 mg | ORAL_TABLET | Freq: Four times a day (QID) | ORAL | Status: DC | PRN
Start: 2015-03-18 — End: 2015-03-20
  Administered 2015-03-18 – 2015-03-20 (×4): 600 mg via ORAL
  Filled 2015-03-18 (×4): qty 1

## 2015-03-18 NOTE — Progress Notes (Addendum)
D Chenae's BP's are much better today...  her overall picture has gotten stronger.as  she has continued all day to force fluids ( water and gatorade) and now....she is beginning to feel stronger an more grounded. She completed her daily assessment and on it she wrote she denied SI and she rated her depression, hopelessness and anxiety " 9/6/9", respectively.    A Detox cont. Ativen tid today per Dr. Dub MikesLugo.    R Safety in place.

## 2015-03-18 NOTE — Progress Notes (Signed)
[facility] MD Progress Note  03/18/2015 5:30 PM Dawn Jackson  MRN:  [id number] Subjective:  Dawn Jackson continues to have a hard time. He is still feeling "shaky" she states she is not sure she will benefit from going to a residential treatment program. States that she has to many of them and that she just needs to do what she needs to do. States that she does not see herself as able to do it if she did not have Klonopin to control her anxiety. States that down the road when she is not under so much stress she could try to come off Klonopin but does not see herself as being successful with abstinence without a benzodiazepine to control her anxiety.  Principal Problem: Alcohol use disorder, severe, dependence (HCC) Diagnosis:   Patient Active Problem List   Diagnosis Date Noted  . Alcohol use disorder, severe, dependence (HCC) [F10.20] 03/17/2015  . PTSD (post-traumatic stress disorder) [F43.10] 02/02/2015  . Severe recurrent major depression without psychotic features (HCC) [F33.2] 02/02/2015   Total Time spent with patient: 30 minutes  Past Psychiatric History: see admission H and P  Past Medical History:  Past Medical History  Diagnosis Date  . Anxiety   . Depression   . Substance abuse     Past Surgical History  Procedure Laterality Date  . Breast biopsy    . Wisdom tooth extraction     Family History: History reviewed. No pertinent family history. Family Psychiatric  History: see admission H and P Social History:  History  Alcohol Use  . Yes    Comment: daily usage 1-2 bottles of wine     History  Drug Use  . Yes  . Special: Cocaine    Social History   Social History  . Marital Status: Single    Spouse Name: N/A  . Number of Children: N/A  . Years of Education: N/A   Social History Main Topics  . Smoking status: Current Every Day Smoker -- 1.00 packs/day for 6 years    Types: Cigarettes  . Smokeless tobacco: None  . Alcohol Use: Yes     Comment: daily usage 1-2 bottles  of wine  . Drug Use: Yes    Special: Cocaine  . Sexual Activity: No   Other Topics Concern  . None   Social History Narrative   Additional Social History:                         Sleep: Fair  Appetite:  Fair  Current Medications: Current Facility-Administered Medications  Medication Dose Route Frequency Provider Last Rate Last Dose  . acetaminophen (TYLENOL) tablet 650 mg  650 mg Oral Q6H PRN Oneta Rackanika N Lewis, NP   650 mg at 03/17/15 1950  . alum & mag hydroxide-simeth (MAALOX/MYLANTA) 200-200-20 MG/5ML suspension 30 mL  30 mL Oral Q4H PRN Oneta Rackanika N Lewis, NP      . feeding supplement (ENSURE ENLIVE) (ENSURE ENLIVE) liquid 237 mL  237 mL Oral TID BM Rachael FeeIrving A Navia Lindahl, MD   237 mL at 03/17/15 1948  . gabapentin (NEURONTIN) capsule 200 mg  200 mg Oral TID Rachael FeeIrving A Jassen Sarver, MD      . hydrOXYzine (ATARAX/VISTARIL) tablet 25 mg  25 mg Oral Q6H PRN Worthy FlankIjeoma E Nwaeze, NP      . loperamide (IMODIUM) capsule 2 mg  2 mg Oral PRN Worthy FlankIjeoma E Nwaeze, NP      . Melene Muller[START ON 03/19/2015] LORazepam (ATIVAN) tablet 1  mg  1 mg Oral TID Rachael Fee, MD      . magnesium hydroxide (MILK OF MAGNESIA) suspension 30 mL  30 mL Oral Daily PRN Oneta Rack, NP      . multivitamin with minerals tablet 1 tablet  1 tablet Oral Daily Oneta Rack, NP   1 tablet at 03/18/15 0815  . nicotine polacrilex (NICORETTE) gum 2 mg  2 mg Oral PRN Rachael Fee, MD   2 mg at 03/18/15 0816  . ondansetron (ZOFRAN-ODT) disintegrating tablet 4 mg  4 mg Oral Q6H PRN Worthy Flank, NP   4 mg at 03/17/15 2300  . sertraline (ZOLOFT) tablet 100 mg  100 mg Oral Daily Oneta Rack, NP   100 mg at 03/18/15 0815  . thiamine (VITAMIN B-1) tablet 100 mg  100 mg Oral Daily Oneta Rack, NP   100 mg at 03/18/15 0815  . traZODone (DESYREL) tablet 100 mg  100 mg Oral QHS Jomarie Longs, MD   100 mg at 03/17/15 2111    Lab Results:  Results for orders placed or performed during the hospital encounter of 03/14/15 (from the past 48 hour(s))   Glucose, capillary     Status: Abnormal   Collection Time: 03/17/15 10:46 PM  Result Value Ref Range   Glucose-Capillary 118 (H) 65 - 99 mg/dL    Physical Findings: AIMS: Facial and Oral Movements Muscles of Facial Expression: None, normal Lips and Perioral Area: None, normal Jaw: None, normal Tongue: None, normal,Extremity Movements Upper (arms, wrists, hands, fingers): None, normal Lower (legs, knees, ankles, toes): None, normal, Trunk Movements Neck, shoulders, hips: None, normal, Overall Severity Severity of abnormal movements (highest score from questions above): None, normal Incapacitation due to abnormal movements: None, normal Patient's awareness of abnormal movements (rate only patient's report): No Awareness, Dental Status Current problems with teeth and/or dentures?: Yes Does patient usually wear dentures?: No  CIWA:  CIWA-Ar Total: 0 COWS:     Musculoskeletal: Strength & Muscle Tone: within normal limits Gait & Station: normal Patient leans: normal  Psychiatric Specialty Exam: Review of Systems  Constitutional: Positive for malaise/fatigue.  HENT: Negative.   Eyes: Negative.   Respiratory: Negative.   Cardiovascular: Negative.   Gastrointestinal: Positive for nausea.  Genitourinary: Negative.   Musculoskeletal: Negative.   Skin: Negative.   Neurological: Positive for tremors and weakness.  Endo/Heme/Allergies: Negative.   Psychiatric/Behavioral: Positive for depression and substance abuse. The patient is nervous/anxious.     Blood pressure 112/62, pulse 66, temperature 98 F (36.7 C), temperature source Oral, resp. rate 18, height  (1.651 m), weight 61.236 kg (135 lb), last menstrual period 02/08/2015, SpO2 99 %.Body mass index is 22.47 kg/(m^2).  General Appearance: Fairly Groomed  Patent attorney::  Fair  Speech:  Clear and Coherent  Volume:  Decreased  Mood:  Anxious  Affect:  anxious worried  Thought Process:  Coherent and Goal Directed   Orientation:  Full (Time, Place, and Person)  Thought Content:  symptoms events worries concerns  Suicidal Thoughts:  No  Homicidal Thoughts:  No  Memory:  Immediate;   Fair Recent;   Fair Remote;   Fair  Judgement:  Fair  Insight:  Present and Shallow  Psychomotor Activity:  Restlessness  Concentration:  Fair  Recall:  [employer] of Knowledge:Fair  Language: Fair  Akathisia:  No  Handed:  Right  AIMS (if indicated):     Assets:  Desire for Improvement  ADL's:  Intact  Cognition: WNL  Sleep:  Number of Hours: 4.25   Treatment Plan Summary: Daily contact with patient to assess and evaluate symptoms and progress in treatment and Medication management Supportive approach/coping skills Alcohol dependence; continue the Ativan detox protocol; decrease to 1 mg TID X 48 hours then BID Work a relapse prevention plan Anxiety; increase the Neurontin to 200 mg TID Depression/PTSD; continue the Zoloft 150 mg daily Insomnia; continue the Trazodone 100 mg HS Explore residential treatment options Durand Wittmeyer A 03/18/2015, 5:30 PM

## 2015-03-18 NOTE — Plan of Care (Signed)
Problem: Ineffective individual coping Goal: STG: Patient will remain free from self harm Outcome: Progressing Pt remains free from self harm as demonstrated by q 15 min checks.

## 2015-03-18 NOTE — [facility] Group Notes (Signed)
[facility] LCSW Group Therapy  03/18/2015 11:42 AM  Type of Therapy:  Group Therapy  Participation Level:  Did Not Attend-pt invited. Chose to rest in bed. Continues to complain of severe withdrawal symptoms.   Modes of Intervention:  Confrontation, Discussion, Education, Exploration, Problem-solving, Rapport Building, Socialization and Support  Summary of Progress/Problems: Feelings around Relapse. Group members discussed the meaning of relapse and shared personal stories of relapse, how it affected them and others, and how they perceived themselves during this time. Group members were encouraged to identify triggers, warning signs and coping skills used when facing the possibility of relapse. Social supports were discussed and explored in detail. Post Acute Withdrawal Syndrome (handout provided) was introduced and examined. Pt's were encouraged to ask questions, talk about key points associated with PAWS, and process this information in terms of relapse prevention.    Smart, Jaide Hillenburg LCSW 03/18/2015, 11:42 AM

## 2015-03-18 NOTE — Progress Notes (Signed)
Pt has been more shaky and weak this evening.   Report is that she is not eating, only drinking ginger ale, gatorade, and some Ensures.  Pt reports that when she tries to eat anything, it makes her vomit.  Pt has been mostly in her room, in bed, reading.  When MHT did her vitals check, her BP was found to be very low.  It was also checked manually and found to still be low.  Pt was given gatorade.  Her detox protocol prns were discontinued, so the NP was contacted to reorder.  Pt was given Zofran for her nausea/vomiting.  Pt's BP rose to WNL by midnight after push fluids and multiple BP checks.  Pt was instructed to refrain from getting out of bed without a staff member with her.  Pt voiced understanding.  In conversation, pt denies SI/HI/AVH this evening.  Support and encouragement offered.  Safety maintained with q15 minute checks.

## 2015-03-18 NOTE — Progress Notes (Signed)
Pt attended AA group this evening.  

## 2015-03-18 NOTE — Progress Notes (Signed)
Report received from 1st shift RN. Pt in room resting and reading a book.  Did not attend group.  Pt sts craving for cigarette.  Nicotine gum given per PRN order.  Pt has received 3 Nicotine gum packets. Back in bed reading. Pt. Alert and oriented in no distress denies SI, HI, AVH and pain.  Pt. Instructed to come to me with problems or concerns.Will continue to monitor for safety via security cameras and Q 15 minute checks.

## 2015-03-18 NOTE — [facility] Group Notes (Signed)
[facility]   03/18/2015 9:28 AM  Participation Quality:  Pt invited. DID NOT ATTEND. Chose to remain in room.   Smart, Dawn Leatham LCSW

## 2015-03-18 NOTE — Progress Notes (Signed)
Patient did not attend the evening karaoke group. Pt was notified that group was beginning but remained in her bed.    

## 2015-03-18 NOTE — [facility] Suicide Risk Assessment (Signed)
[facility] INPATIENT:  Family/Significant Other Suicide Prevention Education  Suicide Prevention Education:  Contact Attempts: Dawn Jackson (pt's sister) [phone] has been identified by the patient as the family member/significant other with whom the patient will be residing, and identified as the person(s) who will aid the patient in the event of a mental health crisis.  With written consent from the patient, two attempts were made to provide suicide prevention education, prior to and/or following the patient's discharge.  We were unsuccessful in providing suicide prevention education.  A suicide education pamphlet was given to the patient to share with family/significant other.  Date and time of first attempt: 03/17/15 at 3:45PM (no voicemail left-home number) Date and time of second attempt: 03/18/15 at 2:40PM (no voicemail left-home number)  Smart, Dawn Moritz LCSW 03/18/2015, 2:43 PM

## 2015-03-19 DIAGNOSIS — F1020 Alcohol dependence, uncomplicated: Secondary | ICD-10-CM | POA: Diagnosis not present

## 2015-03-19 DIAGNOSIS — F4310 Post-traumatic stress disorder, unspecified: Secondary | ICD-10-CM | POA: Diagnosis not present

## 2015-03-19 DIAGNOSIS — F332 Major depressive disorder, recurrent severe without psychotic features: Secondary | ICD-10-CM | POA: Diagnosis not present

## 2015-03-19 MED ORDER — TRAZODONE HCL 100 MG PO TABS
200.0000 mg | ORAL_TABLET | Freq: Every day | ORAL | Status: DC
  Administered 2015-03-19 – 2015-03-20 (×2): 200 mg via ORAL
  Filled 2015-03-19: qty 2
  Filled 2015-03-19: qty 14
  Filled 2015-03-19 (×4): qty 2

## 2015-03-19 MED ORDER — DIPHENHYDRAMINE HCL 50 MG PO CAPS
50.0000 mg | ORAL_CAPSULE | Freq: Once | ORAL | Status: AC
  Administered 2015-03-19: 50 mg via ORAL
  Filled 2015-03-19: qty 1
  Filled 2015-03-19: qty 2

## 2015-03-19 MED ORDER — NICOTINE POLACRILEX 2 MG MT GUM
CHEWING_GUM | OROMUCOSAL | Status: AC
  Filled 2015-03-19: qty 1

## 2015-03-19 NOTE — [facility] Group Notes (Addendum)
[facility] Group Notes:  (Nursing/MHT/Case Management/Adjunct)  Date:  03/19/2015  Time:  1315  Type of Therapy:  Nurse Education. Life Skills " The group is focused on teaching patients how to identify their needs and then how to develop healthier coping skills...influenza ordr to get their needs met.  Participation Level Fair:good     Participation QualityFair :    Affect:  Fair  Cognitive:  Intact  Insight: some    Engagement in Group: 2   Modes of Intervention:    Summary of Progress/Problems:  Lauralyn Primes 03/19/2015, 4:56 PM

## 2015-03-19 NOTE — Progress Notes (Signed)
The patient attended this evening's A. A. Meeting and was appropriate.  

## 2015-03-19 NOTE — [facility] Group Notes (Signed)
[facility] Group Notes: (Clinical Social Work)   03/19/2015      Type of Therapy:  Group Therapy   Participation Level:  Did Not Attend despite MHT prompting   Ambrose MantleMareida Grossman-Orr, LCSW 03/19/2015, 11:53 AM

## 2015-03-19 NOTE — Progress Notes (Signed)
Dawn Jackson says she " slept a little last night". She completed her daily assessment first thing this morning and on it she wrote she denied SI  And she rated her depression, hopelessness and anxiety " 8/4/8", respectively. She remains quite tremulous and this is evident in her hands and face.    A She attended her Life SKills group and was engaged in the discussion and is gaining insight into her issues and is able to separate person from illness.   R Safety is in place, detox cont safely and tonight her trazadone wiill be increased to 200 mg , to assist with sleep.

## 2015-03-19 NOTE — Progress Notes (Signed)
[facility]  03/[facility]/2015  Dawn Jackson  MRN:  [id number] Subjective:  " I am feeling anxious today and  unable to sleep"  Objective: Dawn Jackson is awake, alert and oriented X4.  Pt seen and chart reviewed by Claudette Head, FNP-BC, documentation assisted by this provider. Denies suicidal or homicidal ideation. Denies auditory or visual hallucination and does not appear to be responding to internal stimuli. Patient reports interacting well with staff and others. Patient reports she is medication compliant without mediation side effects. Patient is requesting to take benadryl or Klonopin for her anxiety.  Reports good appetite. State she is having a difficult time resting throughout the night.   Support, encouragement and reassurance was provided.   Principal Problem: Alcohol use disorder, severe, dependence (HCC) Diagnosis:   Patient Active Problem List   Diagnosis Date Noted  . Alcohol use disorder, severe, dependence (HCC) [F10.20] 03/17/2015    Priority: High  . PTSD (post-traumatic stress disorder) [F43.10] 02/02/2015    Priority: High  . Severe recurrent major depression without psychotic features (HCC) [F33.2] 02/02/2015    Priority: High   Total Time spent with patient: 15 minutes  Past Psychiatric History: see admission H and P  Past Medical History:  Past Medical History  Diagnosis Date  . Anxiety   . Depression   . Substance abuse     Past Surgical History  Procedure Laterality Date  . Breast biopsy    . Wisdom tooth extraction     Family History: History reviewed. No pertinent family history. Family Psychiatric  History: see admission H and P Social History:  History  Alcohol Use  . Yes    Comment: daily usage 1-2 bottles of wine     History  Drug Use  . Yes  . Special: Cocaine    Social History   Social History  . Marital Status: Single    Spouse Name: N/A  . Number of Children: N/A  . Years of Education: N/A   Social History Main Topics   . Smoking status: Current Every Day Smoker -- 1.00 packs/day for 6 years    Types: Cigarettes  . Smokeless tobacco: None  . Alcohol Use: Yes     Comment: daily usage 1-2 bottles of wine  . Drug Use: Yes    Special: Cocaine  . Sexual Activity: No   Other Topics Concern  . None   Social History Narrative   Additional Social History:                         Sleep: Poor  Appetite:  Fair  Current Medications: Current Facility-Administered Medications  Medication Dose Route Frequency Provider Last Rate Last Dose  . acetaminophen (TYLENOL) tablet 650 mg  650 mg Oral Q6H PRN Oneta Rack, NP   650 mg at 03/17/15 1950  . alum & mag hydroxide-simeth (MAALOX/MYLANTA) 200-200-20 MG/5ML suspension 30 mL  30 mL Oral Q4H PRN Oneta Rack, NP      . feeding supplement (ENSURE ENLIVE) (ENSURE ENLIVE) liquid 237 mL  237 mL Oral TID BM Rachael Fee, MD   237 mL at 03/18/15 2013  . gabapentin (NEURONTIN) capsule 200 mg  200 mg Oral TID Rachael Fee, MD   200 mg at 03/19/15 1154  . hydrOXYzine (ATARAX/VISTARIL) tablet 25 mg  25 mg Oral Q6H PRN Worthy Flank, NP   25 mg at 03/18/15 2310  . ibuprofen (ADVIL,MOTRIN) tablet 600  mg  600 mg Oral Q6H PRN Worthy FlankIjeoma E Nwaeze, NP   600 mg at 03/19/15 1154  . loperamide (IMODIUM) capsule 2 mg  2 mg Oral PRN Worthy FlankIjeoma E Nwaeze, NP      . LORazepam (ATIVAN) tablet 1 mg  1 mg Oral TID Beau FannyJohn C Withrow, FNP   1 mg at 03/19/15 1158  . magnesium hydroxide (MILK OF MAGNESIA) suspension 30 mL  30 mL Oral Daily PRN Oneta Rackanika N Byrd Rushlow, NP      . multivitamin with minerals tablet 1 tablet  1 tablet Oral Daily Oneta Rackanika N Clearance Chenault, NP   1 tablet at 03/19/15 1037  . nicotine polacrilex (NICORETTE) gum 2 mg  2 mg Oral PRN Rachael FeeIrving A Lugo, MD   2 mg at 03/18/15 2149  . ondansetron (ZOFRAN-ODT) disintegrating tablet 4 mg  4 mg Oral Q6H PRN Worthy FlankIjeoma E Nwaeze, NP   4 mg at 03/17/15 2300  . sertraline (ZOLOFT) tablet 100 mg  100 mg Oral Daily Oneta Rackanika N Imagene Boss, NP   100 mg at 03/19/15  1038  . thiamine (VITAMIN B-1) tablet 100 mg  100 mg Oral Daily Oneta Rackanika N Greydon Betke, NP   100 mg at 03/19/15 1154  . traZODone (DESYREL) tablet 100 mg  100 mg Oral QHS Jomarie LongsSaramma Eappen, MD   100 mg at 03/18/15 2149    Lab Results:  Results for orders placed or performed during the hospital encounter of 03/14/15 (from the past 48 hour(s))  Glucose, capillary     Status: Abnormal   Collection Time: 03/17/15 10:46 PM  Result Value Ref Range   Glucose-Capillary 118 (H) 65 - 99 mg/dL    Physical Findings: AIMS: Facial and Oral Movements Muscles of Facial Expression: None, normal Lips and Perioral Area: None, normal Jaw: None, normal Tongue: None, normal,Extremity Movements Upper (arms, wrists, hands, fingers): None, normal Lower (legs, knees, ankles, toes): None, normal, Trunk Movements Neck, shoulders, hips: None, normal, Overall Severity Severity of abnormal movements (highest score from questions above): None, normal Incapacitation due to abnormal movements: None, normal Patient's awareness of abnormal movements (rate only patient's report): No Awareness, Dental Status Current problems with teeth and/or dentures?: Yes Does patient usually wear dentures?: No  CIWA:  CIWA-Ar Total: 2 COWS:     Musculoskeletal: Strength & Muscle Tone: within normal limits Gait & Station: normal Patient leans: normal  Psychiatric Specialty Exam: Review of Systems  Constitutional: Positive for malaise/fatigue.  HENT: Negative.   Eyes: Negative.   Respiratory: Negative.   Cardiovascular: Negative.   Gastrointestinal: Positive for nausea.  Genitourinary: Negative.   Musculoskeletal: Negative.   Skin: Negative.   Neurological: Positive for tremors and weakness.  Endo/Heme/Allergies: Negative.   Psychiatric/Behavioral: Positive for depression and substance abuse. The patient is nervous/anxious.     Blood pressure 116/67, pulse 81, temperature 98.6 F (37 C), temperature source Oral, resp. rate 18,  height 5\' 5"  (1.651 m), weight 61.236 kg (135 lb), last menstrual period 02/08/2015, SpO2 99 %.Body mass index is 22.47 kg/(m^2).  General Appearance: Fairly Groomed  Patent attorneyye Contact::  Good  Speech:  Clear and Coherent  Volume:  Decreased  Mood:  Anxious and Depressed  Affect:  anxious worried  Thought Process:  Coherent and Goal Directed  Orientation:  Full (Time, Place, and Person)  Thought Content:  symptoms events worries concerns  Suicidal Thoughts:  No  Homicidal Thoughts:  No  Memory:  Immediate;   Fair Recent;   Fair Remote;   Fair  Judgement:  Fair  Insight:  Present and Shallow  Psychomotor Activity:  Restlessness  Concentration:  Fair  Recall:  [employer] of Knowledge:Fair  Language: Fair  Akathisia:  No  Handed:  Right  AIMS (if indicated):     Assets:  Desire for Improvement  ADL's:  Intact  Cognition: WNL  Sleep:  Number of Hours: 3.25     Treatment Plan Summary: Daily contact with patient to assess and evaluate symptoms and progress in treatment and Medication management Supportive approach/coping skills Alcohol dependence; continue the Ativan detox protocol; decrease to 1 mg TID X 48 hours then BID Work a relapse prevention plan Anxiety; increase the Neurontin to 200 mg TID Depression/PTSD; continue the Zoloft 150 mg daily Insomnia; Increased to Trazodone 200 mg po QHS.(per home dose) Explore residential treatment options. Oneta Rack FNP- BC 03/19/2015, 1:05 PM

## 2015-03-20 ENCOUNTER — Ambulatory Visit (HOSPITAL_COMMUNITY)
Admission: AD | Admit: 2015-03-20 | Discharge: 2015-03-20 | Disposition: A | Discharge status: Home / Self Care | Payer: MEDICAID | Source: Home / Self Care | Attending: Family | Admitting: Family

## 2015-03-20 DIAGNOSIS — F332 Major depressive disorder, recurrent severe without psychotic features: Secondary | ICD-10-CM | POA: Diagnosis not present

## 2015-03-20 DIAGNOSIS — F4310 Post-traumatic stress disorder, unspecified: Secondary | ICD-10-CM | POA: Diagnosis not present

## 2015-03-20 DIAGNOSIS — F1020 Alcohol dependence, uncomplicated: Secondary | ICD-10-CM | POA: Diagnosis not present

## 2015-03-20 IMAGING — CR DG KNEE COMPLETE 4+V*R*
4 series · 4 of 4 positions shown · non-contrast
Comparison: None.

CLINICAL DATA: Right knee pain after fall

EXAM:
RIGHT KNEE - COMPLETE 4+ VIEW

[t knee ap right]
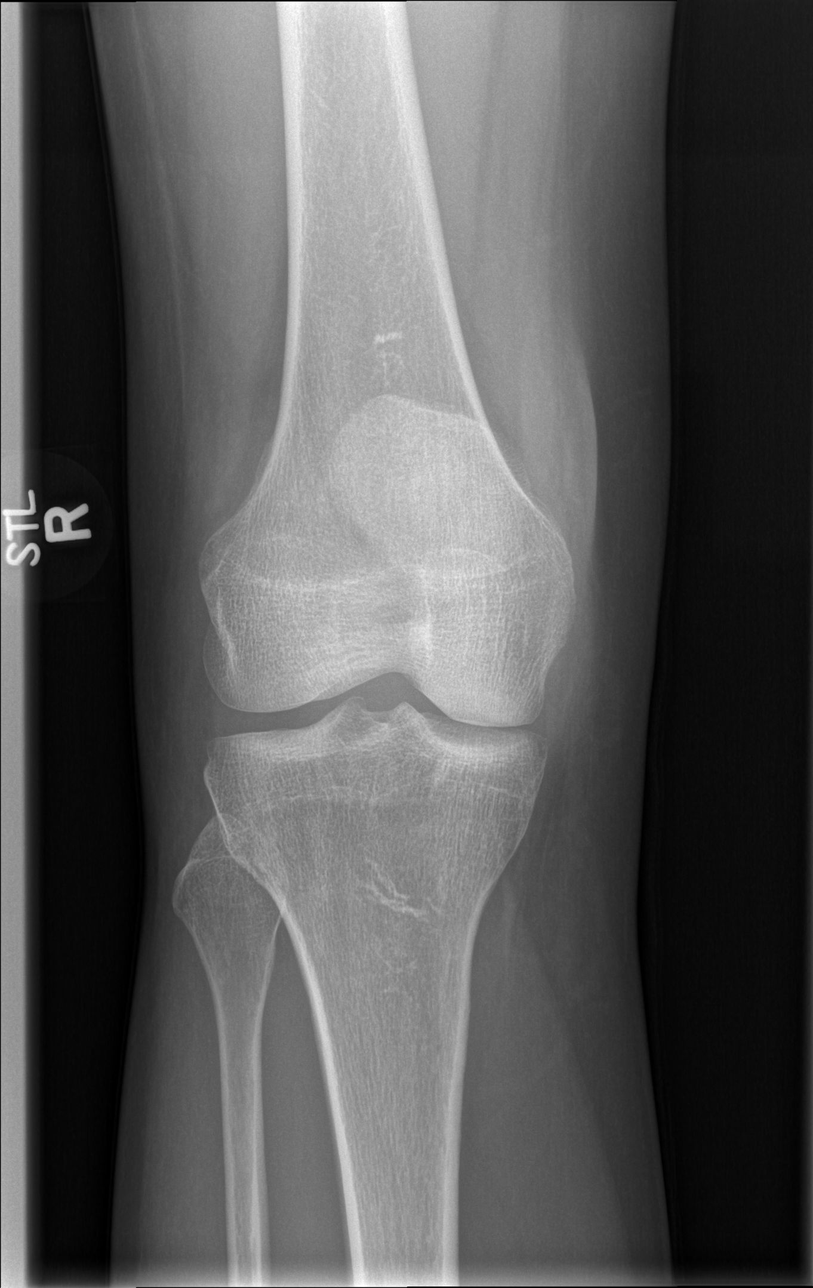

[t knee obl right (1 of 2)]
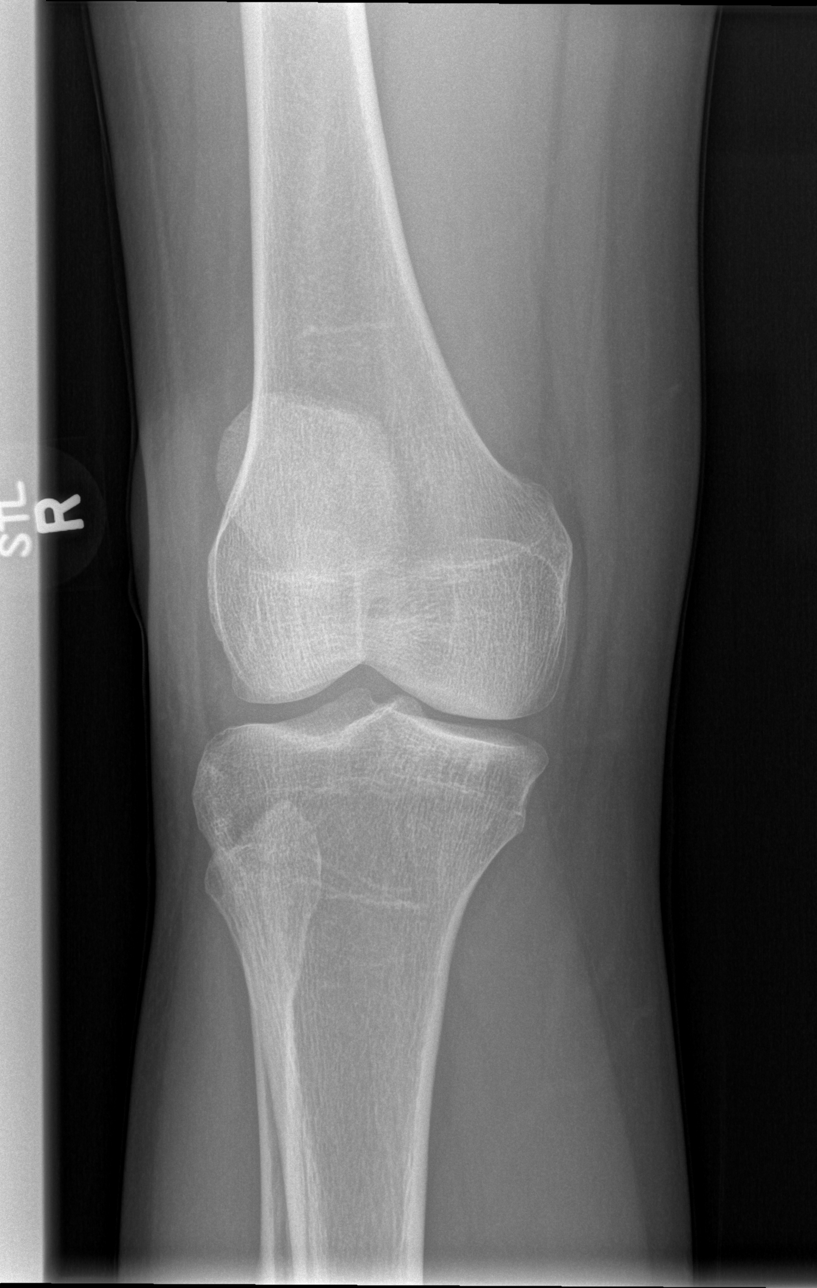

[t knee obl right (2 of 2)]
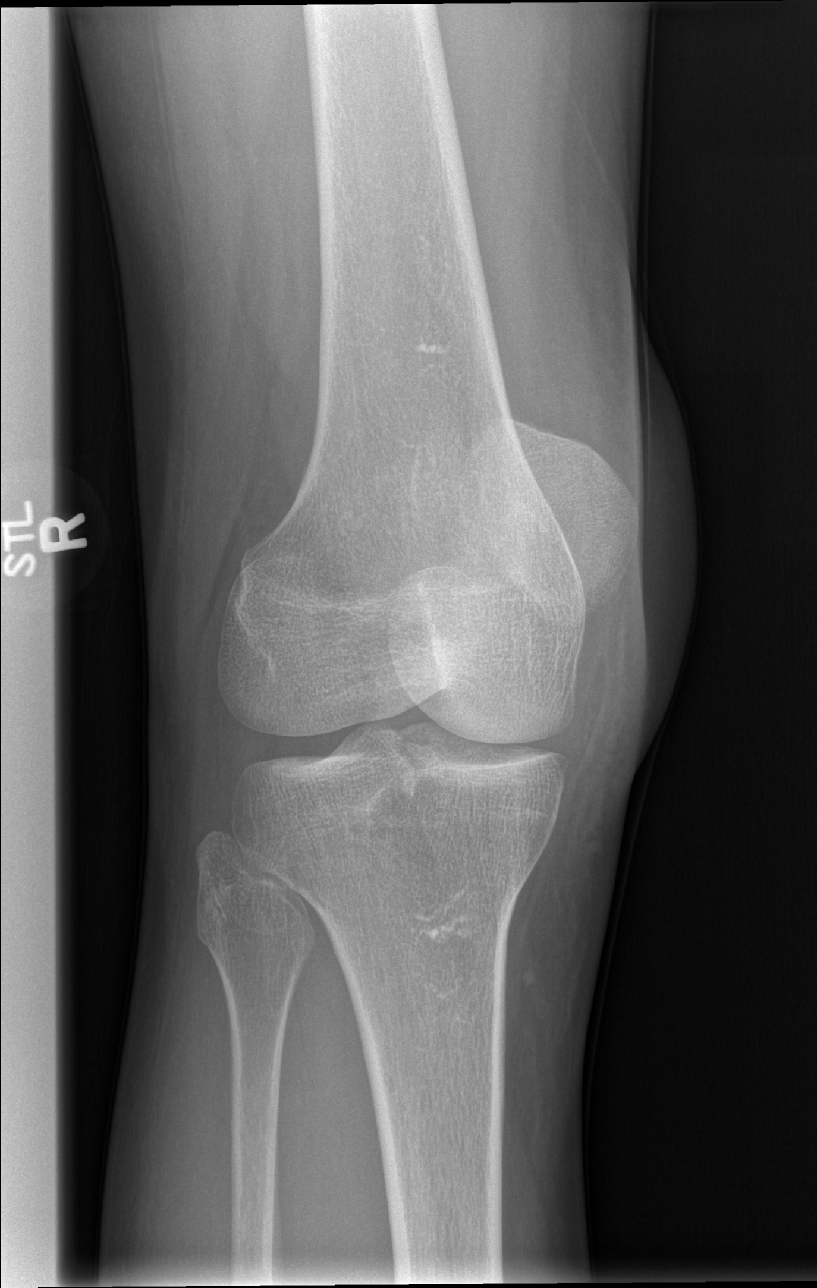

[t knee lat right]
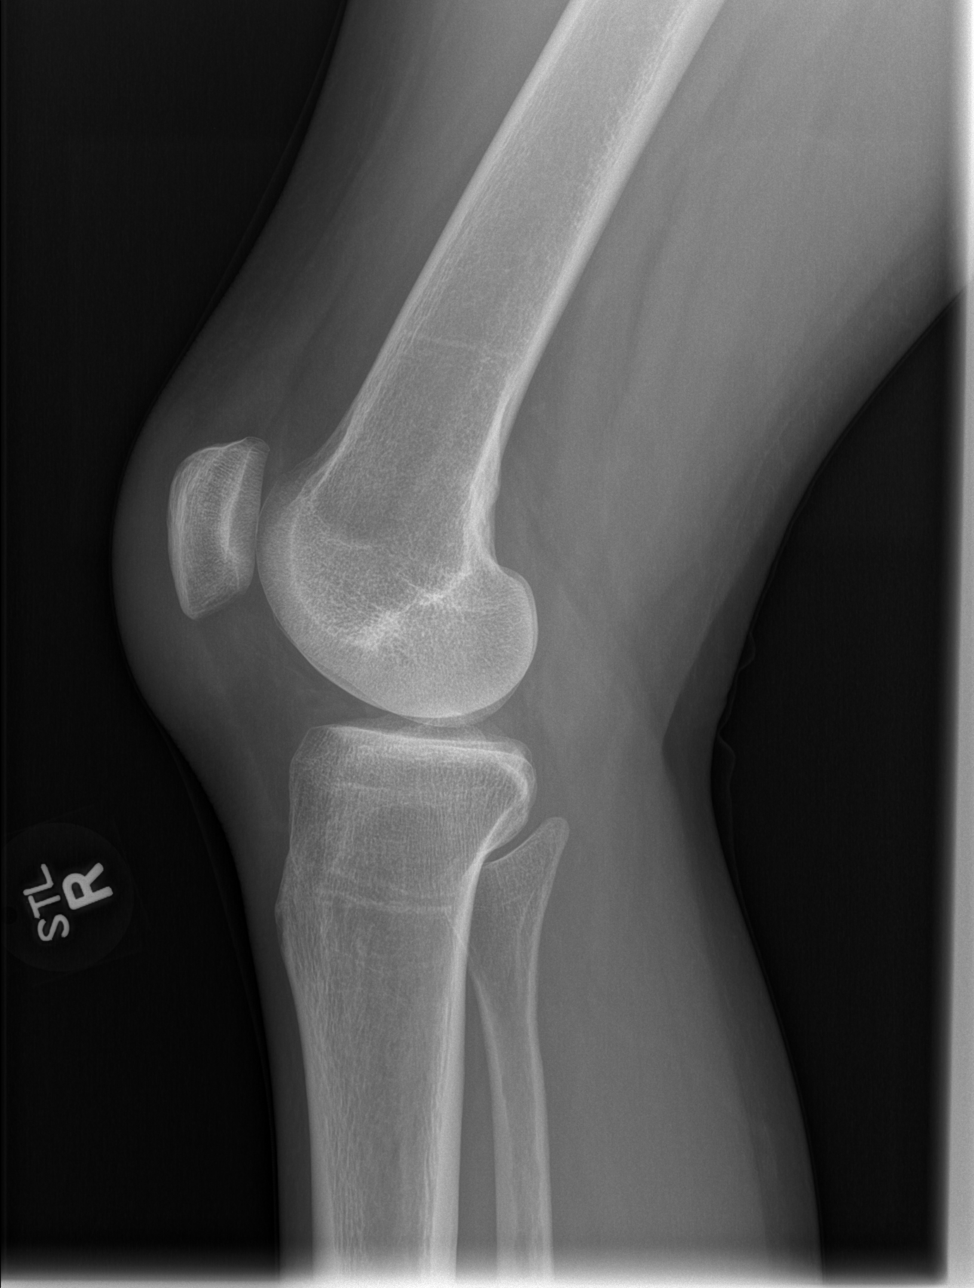

[4 of 4 positions shown; findings below may reference images not displayed]

FINDINGS: There is no evidence of fracture, dislocation, or joint effusion.
There is no evidence of arthropathy or other focal bone abnormality.
Soft tissues are unremarkable.
IMPRESSION: Negative.

## 2015-03-20 MED ORDER — IBUPROFEN 800 MG PO TABS
800.0000 mg | ORAL_TABLET | Freq: Three times a day (TID) | ORAL | Status: DC
  Administered 2015-03-20 – 2015-03-21 (×3): 800 mg via ORAL
  Filled 2015-03-20 (×13): qty 1

## 2015-03-20 MED ORDER — OXYCODONE-ACETAMINOPHEN 5-325 MG PO TABS
1.0000 | ORAL_TABLET | Freq: Three times a day (TID) | ORAL | Status: DC | PRN
  Administered 2015-03-20 – 2015-03-21 (×3): 1 via ORAL
  Filled 2015-03-20 (×3): qty 1

## 2015-03-20 NOTE — [facility] Group Notes (Signed)
[facility] Group Notes:  (Clinical Social Work)  03/20/2015  10:00-11:00AM  Summary of Progress/Problems:   The main focus of today's process group was to   1)  discuss the importance of adding supports  2)  define health supports versus unhealthy supports  3)  identify the patient's current unhealthy supports and plan how to handle them  4)  Identify the patient's current healthy supports and plan what to add.  An emphasis was placed on using counselor, doctor, therapy groups, 12-step groups, and problem-specific support groups to expand supports.    The patient expressed full comprehension of the concepts presented, and agreed that there is a need to add more supports.  The patient stated her current supports include her sisters and family, AA, church, and her EMDR therapist.  She is also seeking a grief/loss support group because she does not know where her 7yo daughter is, has not seen her in 2 years.  She provided encouragement to other patients about their frustrations with finding supports in the community, especially AA, even when they are not happy with what goes on at the meetings sometimes.  Type of Therapy:  Process Group with Motivational Interviewing  Participation Level:  Active  Participation Quality:  Attentive, Sharing and Supportive  Affect:  Appropriate  Cognitive:  Alert, Appropriate and Oriented  Insight:  Engaged  Engagement in Therapy:  Engaged  Modes of Intervention:   Education, Support and Processing, Activity  Ambrose MantleMareida Grossman-Orr, LCSW 03/20/2015

## 2015-03-20 NOTE — Progress Notes (Signed)
Pt out in hall at shift change.  Med seeking behavior.  Wants all prn meds given and is demanding on schedule of administration.  Pt has asked numerous times for meds and schedule when she can have.  Denies SI, HI, AVH.   q 15 min checks to monitor for safety.  Verbal contract for safety.

## 2015-03-20 NOTE — Progress Notes (Signed)
[facility] MD Progress Note  03/20/2015  Dawn Jackson  MRN:  [id number] Subjective:  " I am feeling better today, but my knee really hurts and it's swollen."  Objective: Pt seen and chart reviewed. Pt is alert/oriented x4, calm, cooperative, and appropriate to situation. Pt denies suicidal/homicidal ideation and psychosis and does not appear to be responding to internal stimuli. Pt reports improvement in anxiety/depression symptoms and presents as less anxious today. However, she does complain of right knee pain secondary to a fall a few days ago. See notes below. Sent to [facility] for [employer].   Principal Problem: Alcohol use disorder, severe, dependence (HCC) Diagnosis:   Patient Active Problem List   Diagnosis Date Noted  . Alcohol use disorder, severe, dependence (HCC) [F10.20] 03/17/2015    Priority: High  . PTSD (post-traumatic stress disorder) [F43.10] 02/02/2015    Priority: High  . Severe recurrent major depression without psychotic features (HCC) [F33.2] 02/02/2015    Priority: High   Total Time spent with patient: 15 minutes  Past Psychiatric History: see admission H and P  Past Medical History:  Past Medical History  Diagnosis Date  . Anxiety   . Depression   . Substance abuse     Past Surgical History  Procedure Laterality Date  . Breast biopsy    . Wisdom tooth extraction     Family History: History reviewed. No pertinent family history. Family Psychiatric  History: see admission H and P Social History:  History  Alcohol Use  . Yes    Comment: daily usage 1-2 bottles of wine     History  Drug Use  . Yes  . Special: Cocaine    Social History   Social History  . Marital Status: Single    Spouse Name: N/A  . Number of Children: N/A  . Years of Education: N/A   Social History Main Topics  . Smoking status: Current Every Day Smoker -- 1.00 packs/day for 6 years    Types: Cigarettes  . Smokeless tobacco: None  . Alcohol Use: Yes     Comment: daily usage 1-2  bottles of wine  . Drug Use: Yes    Special: Cocaine  . Sexual Activity: No   Other Topics Concern  . None   Social History Narrative   Additional Social History:                         Sleep: Fair  Appetite:  Fair  Current Medications: Current Facility-Administered Medications  Medication Dose Route Frequency Provider Last Rate Last Dose  . acetaminophen (TYLENOL) tablet 650 mg  650 mg Oral Q6H PRN Oneta Rack, NP   650 mg at 03/19/15 2009  . alum & mag hydroxide-simeth (MAALOX/MYLANTA) 200-200-20 MG/5ML suspension 30 mL  30 mL Oral Q4H PRN Oneta Rack, NP      . feeding supplement (ENSURE ENLIVE) (ENSURE ENLIVE) liquid 237 mL  237 mL Oral TID BM Rachael Fee, MD   237 mL at 03/19/15 1400  . gabapentin (NEURONTIN) capsule 200 mg  200 mg Oral TID Rachael Fee, MD   200 mg at 03/20/15 1653  . hydrOXYzine (ATARAX/VISTARIL) tablet 25 mg  25 mg Oral Q6H PRN Worthy Flank, NP   25 mg at 03/19/15 1416  . ibuprofen (ADVIL,MOTRIN) tablet 800 mg  800 mg Oral 3 times per day Beau Fanny, FNP   800 mg at 03/20/15 1653  . loperamide (IMODIUM)  capsule 2 mg  2 mg Oral PRN Worthy Flank, NP   2 mg at 03/19/15 2006  . LORazepam (ATIVAN) tablet 1 mg  1 mg Oral TID Beau Fanny, FNP   1 mg at 03/20/15 1959  . magnesium hydroxide (MILK OF MAGNESIA) suspension 30 mL  30 mL Oral Daily PRN Oneta Rack, NP      . multivitamin with minerals tablet 1 tablet  1 tablet Oral Daily Oneta Rack, NP   1 tablet at 03/20/15 0805  . nicotine polacrilex (NICORETTE) gum 2 mg  2 mg Oral PRN Rachael Fee, MD   2 mg at 03/20/15 1959  . ondansetron (ZOFRAN-ODT) disintegrating tablet 4 mg  4 mg Oral Q6H PRN Worthy Flank, NP   4 mg at 03/17/15 2300  . oxyCODONE-acetaminophen (PERCOCET/ROXICET) 5-325 MG per tablet 1 tablet  1 tablet Oral Q8H PRN Beau Fanny, FNP   1 tablet at 03/20/15 1653  . sertraline (ZOLOFT) tablet 100 mg  100 mg Oral Daily Oneta Rack, NP   100 mg at 03/20/15  0805  . thiamine (VITAMIN B-1) tablet 100 mg  100 mg Oral Daily Oneta Rack, NP   100 mg at 03/20/15 0800  . traZODone (DESYREL) tablet 200 mg  200 mg Oral QHS Beau Fanny, FNP   200 mg at 03/19/15 2216    Lab Results:  No results found for this or any previous visit (from the past 48 hour(s)).  Physical Findings: AIMS: Facial and Oral Movements Muscles of Facial Expression: None, normal Lips and Perioral Area: None, normal Jaw: None, normal Tongue: None, normal,Extremity Movements Upper (arms, wrists, hands, fingers): None, normal Lower (legs, knees, ankles, toes): None, normal, Trunk Movements Neck, shoulders, hips: None, normal, Overall Severity Severity of abnormal movements (highest score from questions above): None, normal Incapacitation due to abnormal movements: None, normal Patient's awareness of abnormal movements (rate only patient's report): No Awareness, Dental Status Current problems with teeth and/or dentures?: Yes Does patient usually wear dentures?: No  CIWA:  CIWA-Ar Total: 2 COWS:     Musculoskeletal: Strength & Muscle Tone: within normal limits Gait & Station: normal Patient leans: normal  Psychiatric Specialty Exam: Review of Systems  Constitutional: Positive for malaise/fatigue.  HENT: Negative.   Eyes: Negative.   Respiratory: Negative.   Cardiovascular: Negative.   Gastrointestinal: Positive for nausea.  Genitourinary: Negative.   Musculoskeletal: Positive for joint pain (Right knee with significant +3 edema "hump", TTP laterally around patellar region).  Skin: Negative.   Neurological: Positive for tremors and weakness.  Endo/Heme/Allergies: Negative.   Psychiatric/Behavioral: Positive for depression and substance abuse. The patient is nervous/anxious.     Blood pressure 107/77, pulse 75, temperature 97.6 F (36.4 C), temperature source Oral, resp. rate 18, height  (1.651 m), weight 61.236 kg (135 lb), last menstrual period 02/08/2015,  SpO2 99 %.Body mass index is 22.47 kg/(m^2).  General Appearance: Fairly Groomed  Patent attorney::  Good  Speech:  Clear and Coherent  Volume:  Decreased  Mood:  Anxious and Depressed although improving  Affect:  anxious worried  Thought Process:  Coherent and Goal Directed  Orientation:  Full (Time, Place, and Person)  Thought Content:  symptoms events worries concerns  Suicidal Thoughts:  No  Homicidal Thoughts:  No  Memory:  Immediate;   Fair Recent;   Fair Remote;   Fair  Judgement:  Fair  Insight:  Present and Shallow  Psychomotor Activity:  Restlessness  Concentration:  Fair  Recall:  FiservFair  Fund of Knowledge:Fair  Language: Fair  Akathisia:  No  Handed:  Right  AIMS (if indicated):     Assets:  Desire for Improvement  ADL's:  Intact  Cognition: WNL  Sleep:  Number of Hours: 3.25   *On 03/20/2015 and, I have reviewed and concur with treatment plan below, modified as follows:   Treatment Plan Summary: Daily contact with patient to assess and evaluate symptoms and progress in treatment and Medication management Supportive approach/coping skills Alcohol dependence; continue the Ativan detox protocol; decrease to 1 mg TID X 48 hours then BID Work a relapse prevention plan Anxiety; increase the Neurontin to 200 mg TID Depression/PTSD; continue the Zoloft 150 mg daily Insomnia; Continue  Trazodone 200 mg po QHS.(per home dose) Explore residential treatment options.  Update:  03/20/2015 and 8:51 PM DG 4 view R-Knee resulted at this time; reviewed and unremarkable, ruled out fracture/dislocation. This points to severe soft-tissue injury for which we will continue as follows:  -change Ibuprofen to 800mg  tid  -percocet 5-325 (1 tab) po q8h prn -discontinue wheelchair (not necessary although pt may request for long walks to cafeteria -pt is NOT to go to the gym for the next 48 hrs at which time we will re-assess her mobility so as to avoid further agitation of the  injury   Beau FannyWithrow, Wilver Tignor C FNP- [facility] 03/20/2015, 04:33 PM

## 2015-03-20 NOTE — Progress Notes (Signed)
The patient attended this evening's A. A. Meeting and was appropriate.  

## 2015-03-21 DIAGNOSIS — F1024 Alcohol dependence with alcohol-induced mood disorder: Secondary | ICD-10-CM | POA: Insufficient documentation

## 2015-03-21 DIAGNOSIS — F1020 Alcohol dependence, uncomplicated: Secondary | ICD-10-CM | POA: Diagnosis not present

## 2015-03-21 MED ORDER — GABAPENTIN 100 MG PO CAPS: 200.0000 mg | ORAL_CAPSULE | Freq: Three times a day (TID) | ORAL | Status: DC

## 2015-03-21 MED ORDER — LORAZEPAM 1 MG PO TABS
1.0000 mg | ORAL_TABLET | Freq: Three times a day (TID) | ORAL | Status: DC | PRN
Start: 2015-03-21 — End: 2015-03-21

## 2015-03-21 MED ORDER — SERTRALINE HCL 100 MG PO TABS: 100.0000 mg | ORAL_TABLET | Freq: Every day | ORAL | Status: DC

## 2015-03-21 MED ORDER — OXYCODONE-ACETAMINOPHEN 5-325 MG PO TABS: 1.0000 | ORAL_TABLET | Freq: Three times a day (TID) | ORAL | Status: DC | PRN

## 2015-03-21 MED ORDER — LORAZEPAM 1 MG PO TABS: 1.0000 mg | ORAL_TABLET | ORAL | Status: DC

## 2015-03-21 MED ORDER — TRAZODONE HCL 100 MG PO TABS: 200.0000 mg | ORAL_TABLET | Freq: Every day | ORAL | Status: DC
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
## 2015-03-21 NOTE — Plan of Care (Signed)
Problem: Ineffective individual coping Goal: STG-Increase in ability to manage activities of daily living Outcome: Progressing Patient showered, dressed, completing ADLs.  Problem: Alteration in mood & ability to function due to Goal: STG-Patient will attend groups Outcome: Progressing Patient attending and participating in programming.

## 2015-03-21 NOTE — Plan of Care (Signed)
Problem: Diagnosis: Increased Risk For Suicide Attempt Goal: STG-Patient Will Comply With Medication Regime Outcome: Progressing Patient is compliant with scheduled medications.     

## 2015-03-21 NOTE — Progress Notes (Signed)
  [facility] :  Will you be returning to the same living situation after discharge:  Yes,  home At discharge, do you have transportation home?: Yes,  sister Do you have the ability to pay for your medications: Yes,  mental health  Release of information consent forms completed and submitted to medical records by CSW.  Patient to Follow up at: Follow-up Information    [facility].   Why:  Walk in between 8am-11am Monday through Friday for hospital follow-up/medication management/assessment for therapy and support group services.   Contact information:   [address] [place], [place] [address] Phone: [phone] Fax: [phone]      Next level of care provider has [facility] Link:no  Patient denies SI/HI: Yes,  during group/self report.     Safety Planning and Suicide Prevention discussed: Yes,  SPE completed with pt. Contact attempts made with pt's sister. SPI pamphlet and mobile crisis information provided to pt and she was encouraged to share information with support network.   Have you used any form of tobacco in the last 30 days? (Cigarettes, Smokeless Tobacco, Cigars, and/or Pipes): Yes  Has patient been referred to the Quitline?: Patient refused referral  Smart, Dymin Dingledine LCSW 03/21/2015, 11:00 AM

## 2015-03-21 NOTE — Progress Notes (Signed)
Writer spoke with patient 1:1 and she appeared less tremulous and less anxious. She reported that she had to have her knee x-rayed today. She reported that she was feeling no pain because she had a percocet earlier. She plans to start back up her AA classes and will have it arranged so that someone will pick her up for her meetings. She has been observed up in the dayroom watching tv after AA meeting and was compliant with scheduled medications. Support given and safety maintained on unit with 15 min checks.

## 2015-03-21 NOTE — Progress Notes (Signed)
Patient verbalizes readiness for discharge. Follow up plan explained, Rx's given along with sample meds. All belongings returned. Patient verbalizes understanding. Denies SI/HI and assures this Clinical research associatewriter she will seek assistance should that status change. Patient discharged ambulatory and in stable condition to sister. Lawrence MarseillesFriedman, Dawn Jackson
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
## 2015-03-21 NOTE — Progress Notes (Signed)
Patient up and visible in milieu. Remains anxious in affect and mood however requesting discharge. Rating depression at a 7/10, denies hopelessness and rates anxiety at an 8/10. Complaining of R knee pain which is presently a 2/10. States her goal is "to be discharged today. Either way, an AA meeting is definitely a goal. Also, to get my exit prescriptions nailed down." Patient offered support, given medications per orders. Ice pack provided and encouraged to elevate knee. Fall precautions reviewed. Encouraged fluids as patient complained of dizziness at breakfast, now denies. Patient verbalizes understanding. States she pushed fluids and is feeling better. She denies SI/HI and remains safe. Lawrence MarseillesFriedman, Marl Seago Eakes

## 2015-04-17 DIAGNOSIS — I82409 Acute embolism and thrombosis of unspecified deep veins of unspecified lower extremity: Secondary | ICD-10-CM

## 2015-04-17 DIAGNOSIS — K921 Melena: Secondary | ICD-10-CM

## 2015-04-17 HISTORY — DX: Acute embolism and thrombosis of unspecified deep veins of unspecified lower extremity: I82.409

## 2015-04-17 HISTORY — DX: Melena: K92.1

## 2015-04-25 ENCOUNTER — Encounter: Payer: Self-pay | Admitting: Physician Assistant

## 2015-04-25 ENCOUNTER — Ambulatory Visit (INDEPENDENT_AMBULATORY_CARE_PROVIDER_SITE_OTHER): Payer: Self-pay | Admitting: Family Medicine

## 2015-04-25 VITALS — BP 128/80 | HR 81 | Temp 98.3°F | Resp 18 | Ht 66.5 in | Wt 137.0 lb

## 2015-04-25 DIAGNOSIS — F411 Generalized anxiety disorder: Secondary | ICD-10-CM

## 2015-04-25 MED ORDER — HYDROXYZINE HCL 25 MG PO TABS: 25.0000 mg | ORAL_TABLET | Freq: Three times a day (TID) | ORAL | Status: DC | PRN
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
## 2015-04-25 NOTE — Patient Instructions (Addendum)
I would like you to continue this road to anxiety.   Please continue to keep close follow up with therapy. Please attempt the hydroxyzine.  This may be sedative, so be careful with operating machinery, driving, etc. Please let me know how you are doing with the next 3-4 weeks.

## 2015-05-16 ENCOUNTER — Encounter (HOSPITAL_COMMUNITY): Payer: Self-pay | Admitting: Emergency Medicine

## 2015-05-16 ENCOUNTER — Observation Stay (HOSPITAL_COMMUNITY)
Admission: AD | Admit: 2015-05-16 | Discharge: 2015-05-17 | Disposition: A | Discharge status: Home / Self Care | Payer: Federal, State, Local not specified - Other | Source: Intra-hospital | Attending: Psychiatry | Admitting: Psychiatry

## 2015-05-16 ENCOUNTER — Encounter (HOSPITAL_COMMUNITY): Payer: Self-pay | Admitting: *Deleted

## 2015-05-16 ENCOUNTER — Emergency Department (HOSPITAL_COMMUNITY)
Admission: EM | Admit: 2015-05-16 | Discharge: 2015-05-16 | Disposition: A | Discharge status: Other Acute Inpatient Hospital | Payer: MEDICAID | Attending: Emergency Medicine | Admitting: Emergency Medicine

## 2015-05-16 DIAGNOSIS — F1094 Alcohol use, unspecified with alcohol-induced mood disorder: Secondary | ICD-10-CM | POA: Diagnosis not present

## 2015-05-16 DIAGNOSIS — F17210 Nicotine dependence, cigarettes, uncomplicated: Secondary | ICD-10-CM | POA: Insufficient documentation

## 2015-05-16 DIAGNOSIS — F419 Anxiety disorder, unspecified: Secondary | ICD-10-CM | POA: Insufficient documentation

## 2015-05-16 DIAGNOSIS — F1010 Alcohol abuse, uncomplicated: Secondary | ICD-10-CM

## 2015-05-16 DIAGNOSIS — Z79899 Other long term (current) drug therapy: Secondary | ICD-10-CM | POA: Insufficient documentation

## 2015-05-16 DIAGNOSIS — F332 Major depressive disorder, recurrent severe without psychotic features: Secondary | ICD-10-CM | POA: Insufficient documentation

## 2015-05-16 DIAGNOSIS — Z3202 Encounter for pregnancy test, result negative: Secondary | ICD-10-CM | POA: Insufficient documentation

## 2015-05-16 DIAGNOSIS — F10239 Alcohol dependence with withdrawal, unspecified: Secondary | ICD-10-CM | POA: Insufficient documentation

## 2015-05-16 DIAGNOSIS — F1024 Alcohol dependence with alcohol-induced mood disorder: Secondary | ICD-10-CM | POA: Insufficient documentation

## 2015-05-16 DIAGNOSIS — F4310 Post-traumatic stress disorder, unspecified: Secondary | ICD-10-CM | POA: Insufficient documentation

## 2015-05-16 DIAGNOSIS — F329 Major depressive disorder, single episode, unspecified: Secondary | ICD-10-CM | POA: Insufficient documentation

## 2015-05-16 DIAGNOSIS — F10230 Alcohol dependence with withdrawal, uncomplicated: Secondary | ICD-10-CM | POA: Insufficient documentation

## 2015-05-16 LAB — I-STAT CHEM 8, ED
BUN: 10 mg/dL (ref 6–20)
Calcium, Ion: 1.08 mmol/L — ABNORMAL LOW (ref 1.12–1.23)
Chloride: 105 mmol/L (ref 101–111)
Creatinine, Ser: 1 mg/dL (ref 0.44–1.00)
Glucose, Bld: 154 mg/dL — ABNORMAL HIGH (ref 65–99)
HCT: 46 % (ref 36.0–46.0)
Hemoglobin: 15.6 g/dL — ABNORMAL HIGH (ref 12.0–15.0)
Potassium: 4 mmol/L (ref 3.5–5.1)
Sodium: 143 mmol/L (ref 135–145)
TCO2: 23 mmol/L (ref 0–100)

## 2015-05-16 LAB — I-STAT BETA HCG BLOOD, ED (MC, WL, AP ONLY): I-stat hCG, quantitative: <5 m[IU]/mL (ref <5)

## 2015-05-16 LAB — CBC WITH DIFFERENTIAL/PLATELET
Basophils Absolute: 0 10*3/uL (ref 0.0–0.1)
Basophils Relative: 1 %
Eosinophils Absolute: 0 10*3/uL (ref 0.0–0.7)
Eosinophils Relative: 0 %
HCT: 42.6 % (ref 36.0–46.0)
Hemoglobin: 14.2 g/dL (ref 12.0–15.0)
Lymphocytes Relative: 37 %
Lymphs Abs: 1.4 10*3/uL (ref 0.7–4.0)
MCH: 35.2 pg — ABNORMAL HIGH (ref 26.0–34.0)
MCHC: 33.3 g/dL (ref 30.0–36.0)
MCV: 105.7 fL — ABNORMAL HIGH (ref 78.0–100.0)
Monocytes Absolute: 0.7 10*3/uL (ref 0.1–1.0)
Monocytes Relative: 18 %
Neutro Abs: 1.6 10*3/uL — ABNORMAL LOW (ref 1.7–7.7)
Neutrophils Relative %: 44 %
Platelets: 187 10*3/uL (ref 150–400)
RBC: 4.03 MIL/uL (ref 3.87–5.11)
RDW: 14.1 % (ref 11.5–15.5)
WBC: 3.7 10*3/uL — ABNORMAL LOW (ref 4.0–10.5)

## 2015-05-16 LAB — RAPID URINE DRUG SCREEN, HOSP PERFORMED
Amphetamines: NOT DETECTED
Barbiturates: NOT DETECTED
Benzodiazepines: NOT DETECTED
Cocaine: NOT DETECTED
Opiates: NOT DETECTED
Tetrahydrocannabinol: NOT DETECTED

## 2015-05-16 LAB — ETHANOL: Alcohol, Ethyl (B): 419 mg/dL (ref <5)

## 2015-05-16 MED ORDER — NICOTINE POLACRILEX 2 MG MT GUM
2.0000 mg | CHEWING_GUM | OROMUCOSAL | Status: DC | PRN
  Administered 2015-05-16 – 2015-05-17 (×2): 2 mg via ORAL
  Filled 2015-05-16 (×2): qty 1

## 2015-05-16 MED ORDER — THIAMINE HCL 100 MG/ML IJ SOLN
100.0000 mg | Freq: Once | INTRAMUSCULAR | Status: DC
  Administered 2015-05-16: 100 mg via INTRAMUSCULAR
  Filled 2015-05-16: qty 2

## 2015-05-16 MED ORDER — VITAMIN B-1 100 MG PO TABS
100.0000 mg | ORAL_TABLET | Freq: Every day | ORAL | Status: DC
  Administered 2015-05-17: 100 mg via ORAL
  Filled 2015-05-16: qty 1

## 2015-05-16 MED ORDER — MAGNESIUM HYDROXIDE 400 MG/5ML PO SUSP: 30.0000 mL | Freq: Every day | ORAL | Status: DC | PRN

## 2015-05-16 MED ORDER — ALUM & MAG HYDROXIDE-SIMETH 200-200-20 MG/5ML PO SUSP: 30.0000 mL | ORAL | Status: DC | PRN

## 2015-05-16 MED ORDER — THIAMINE HCL 100 MG/ML IJ SOLN: 100.0000 mg | Freq: Once | INTRAMUSCULAR | Status: DC

## 2015-05-16 MED ORDER — LORAZEPAM 1 MG PO TABS: 1.0000 mg | ORAL_TABLET | Freq: Every day | ORAL | Status: DC

## 2015-05-16 MED ORDER — NICOTINE 21 MG/24HR TD PT24
21.0000 mg | MEDICATED_PATCH | Freq: Once | TRANSDERMAL | Status: DC
  Administered 2015-05-16: 21 mg via TRANSDERMAL
  Filled 2015-05-16: qty 1

## 2015-05-16 MED ORDER — ADULT MULTIVITAMIN W/MINERALS CH
1.0000 | ORAL_TABLET | Freq: Every day | ORAL | Status: DC
  Administered 2015-05-16: 1 via ORAL
  Filled 2015-05-16: qty 1

## 2015-05-16 MED ORDER — LORAZEPAM 1 MG PO TABS: 1.0000 mg | ORAL_TABLET | Freq: Three times a day (TID) | ORAL | Status: DC

## 2015-05-16 MED ORDER — HYDROXYZINE HCL 25 MG PO TABS: 25.0000 mg | ORAL_TABLET | Freq: Four times a day (QID) | ORAL | Status: DC | PRN

## 2015-05-16 MED ORDER — ONDANSETRON HCL 4 MG PO TABS: 4.0000 mg | ORAL_TABLET | Freq: Three times a day (TID) | ORAL | Status: DC | PRN

## 2015-05-16 MED ORDER — LORAZEPAM 1 MG PO TABS: 1.0000 mg | ORAL_TABLET | Freq: Two times a day (BID) | ORAL | Status: DC

## 2015-05-16 MED ORDER — LOPERAMIDE HCL 2 MG PO CAPS: 2.0000 mg | ORAL_CAPSULE | ORAL | Status: DC | PRN

## 2015-05-16 MED ORDER — ACETAMINOPHEN 325 MG PO TABS: 650.0000 mg | ORAL_TABLET | ORAL | Status: DC | PRN

## 2015-05-16 MED ORDER — SERTRALINE HCL 100 MG PO TABS
100.0000 mg | ORAL_TABLET | Freq: Every day | ORAL | Status: DC
  Administered 2015-05-17: 100 mg via ORAL
  Filled 2015-05-16: qty 1

## 2015-05-16 MED ORDER — LORAZEPAM 1 MG PO TABS
1.0000 mg | ORAL_TABLET | Freq: Four times a day (QID) | ORAL | Status: AC
  Administered 2015-05-16 – 2015-05-17 (×4): 1 mg via ORAL
  Filled 2015-05-16 (×4): qty 1

## 2015-05-16 MED ORDER — LORAZEPAM 1 MG PO TABS
1.0000 mg | ORAL_TABLET | Freq: Four times a day (QID) | ORAL | Status: DC | PRN
  Administered 2015-05-16: 1 mg via ORAL

## 2015-05-16 MED ORDER — ONDANSETRON 4 MG PO TBDP
4.0000 mg | ORAL_TABLET | Freq: Four times a day (QID) | ORAL | Status: DC | PRN
  Administered 2015-05-16: 4 mg via ORAL
  Filled 2015-05-16: qty 1

## 2015-05-16 MED ORDER — ONDANSETRON 4 MG PO TBDP: 4.0000 mg | ORAL_TABLET | Freq: Four times a day (QID) | ORAL | Status: DC | PRN

## 2015-05-16 MED ORDER — IBUPROFEN 200 MG PO TABS: 600.0000 mg | ORAL_TABLET | Freq: Three times a day (TID) | ORAL | Status: DC | PRN

## 2015-05-16 MED ORDER — IBUPROFEN 600 MG PO TABS: 600.0000 mg | ORAL_TABLET | Freq: Three times a day (TID) | ORAL | Status: DC | PRN

## 2015-05-16 MED ORDER — LORAZEPAM 1 MG PO TABS: 1.0000 mg | ORAL_TABLET | Freq: Four times a day (QID) | ORAL | Status: DC | PRN

## 2015-05-16 MED ORDER — LORAZEPAM 1 MG PO TABS
1.0000 mg | ORAL_TABLET | Freq: Four times a day (QID) | ORAL | Status: DC
  Administered 2015-05-16: 1 mg via ORAL
  Filled 2015-05-16 (×2): qty 1

## 2015-05-16 MED ORDER — TRAZODONE HCL 100 MG PO TABS
100.0000 mg | ORAL_TABLET | Freq: Every evening | ORAL | Status: DC | PRN
  Administered 2015-05-16: 100 mg via ORAL
  Filled 2015-05-16: qty 1

## 2015-05-16 MED ORDER — ADULT MULTIVITAMIN W/MINERALS CH
1.0000 | ORAL_TABLET | Freq: Every day | ORAL | Status: DC
  Administered 2015-05-17: 1 via ORAL
  Filled 2015-05-16: qty 1

## 2015-05-16 MED ORDER — VITAMIN B-1 100 MG PO TABS: 100.0000 mg | ORAL_TABLET | Freq: Every day | ORAL | Status: DC

## 2015-05-16 MED ORDER — ACETAMINOPHEN 325 MG PO TABS: 650.0000 mg | ORAL_TABLET | Freq: Four times a day (QID) | ORAL | Status: DC | PRN

## 2015-05-16 MED ORDER — PROMETHAZINE HCL 25 MG/ML IJ SOLN
25.0000 mg | Freq: Once | INTRAMUSCULAR | Status: AC
  Administered 2015-05-16: 25 mg via INTRAMUSCULAR

## 2015-05-16 NOTE — Progress Notes (Signed)
Pt admitted to the observation unit with alcohol detox. Pt was vomiting as this Clinical research associate completed assessment. Pt reports that she has an upcoming court date for custody of there 47 yr old child. Pt was given ativan and zofran before coming to the observation unit. Pt reports a hx of abuse. Pt has a hx of cocaine abuse and her UDS is negative.

## 2015-05-16 NOTE — Progress Notes (Signed)
[place] Been in [place] 3 months Provided with [employer] uninsured resources States she prefers to be seen by all doctors ans counselors at the family services of the [employer]    CM discussed and provided written information for uninsured accepting pcps, discussed the importance of pcp vs EDP services for f/u care, www.needymeds.org, www.goodrx.com, discounted pharmacies and other [employer] such as [employer] , [employer], affordable care act, financial assistance, uninsured dental services, [place] med assist, DSS and  health department  Reviewed resources for [employer] uninsured accepting pcps like [facility] Kussmaul, family medicine at [facility] [employer] [facility], [facility], [facility], local urgent care centers, [facility], [facility] family practice, general medical clinics, family services of the [place], [facility] urgent care plus others, medication resources, [facility] out patient pharmacies and housing Pt voiced understanding and appreciation of resources provided   Pt was initially lying on her abdomen but sat up on stretcher in [place] fashion (crossed legs) Pleasant Asked about d/c plan Referred her to [employer] Staff response from their visit earlier in day.  Pt able to tell CM that she was told she would go to observation for "twenty three hours across the street"  Pt asked for lunch tray. Cm discussed pt lunch tray with lead ED CNA, Chrissie Noa    Please use the resources provided to you in emergency room by case manager to assist with doctor for follow up These [employer] uninsured resources provide possible primary care providers, resources for discounted medications, housing, dental resources, affordable care act information, plus other resources for [employer] [facility] Schedule an appointment as soon as possible for a visit this is the family pcps/counselors you stated you prefer to use as pcp services [address] [place] [address] [phone]

## 2015-05-16 NOTE — ED Notes (Signed)
Bed: WA03 Expected date:  Expected time:  Means of arrival:  Comments: ETOH detox

## 2015-05-16 NOTE — ED Notes (Signed)
Pt is intoxicated. Denies any pain or injury. Able to follow commands and aware of why she is here.

## 2015-05-16 NOTE — BH Assessment (Signed)
[facility] Assessment Progress Note  Per morning shift report, this pt has been accepted to an unspecified bed at the [facility] Observation Unit by Hulan Fess, NP.  Thedore Mins, MD, concurs with this decision.  Pt has signed Consent for Admission and Treatment, as well as Consent to Release Information to her therapist and her sister.  Signed forms have been faxed to [facility].  Pt's nurse has been notified.  She agrees to call send original documents along with pt via Juel Burrow, and to call report to [phone] or [phone].  Doylene Canning, MA Triage Specialist [phone]

## 2015-05-16 NOTE — H&P (Signed)
[facility]-Observation Unit Admission Assessment Adult  Patient Identification: Dawn Jackson MRN:  [id number] Date of Evaluation:  05/16/2015 Chief Complaint:  Patient states "I have been drinking wine daily to cope with my stress."  Principal Diagnosis: Alcohol-induced mood disorder (HCC) Diagnosis:   Patient Active Problem List   Diagnosis Date Noted  . Alcohol-induced mood disorder (HCC) [F10.94] 05/16/2015  . Alcohol dependence with uncomplicated withdrawal (HCC) [F10.230]   . Alcohol use disorder, severe, dependence (HCC) [F10.20] 03/17/2015  . PTSD (post-traumatic stress disorder) [F43.10] 02/02/2015  . Severe recurrent major depression without psychotic features (HCC) [F33.2] 02/02/2015   History of Present Illness::   Dawn Jackson is a divorced, 45 y.o. female presenting to [facility] c/o worsening depression with SI and need for detox treatment. She reports a long history of alcohol addiction and depression. The patient was last inpatient at [facility] in October and November 2016 for similar issues. At that time she was discharged on a regimen that included Neurontin, Zoloft, and Trazodone. Upon arrival the [facility] unit today the patient was noted to be experiencing severe withdrawal symptoms of nausea and vomiting. Her condition improved after being given Phenergan 25 mg IM for one dose. Patient was able to report having a period of sobriety of one month after leaving [facility]. She stated "I am still having child custody issues. My 16 year old daughter is living in [place] with her father. I am not able to see her. I have to get four months sober before I can be granted visitation. I moved back here from [place] to be closer to my family. I have been drinking a bottle of wine a day for several weeks. The last few days I started drinking the bigger bottle. I have been taking the Zoloft and it helps with my depression. I do not think that the Neurontin was helpful." Patient's blood alcohol level on  admission to the ED was quite high at 419. Her urine drug screen is negative for any substances.   Associated Signs/Symptoms: Depression Symptoms:  depressed mood, anhedonia, insomnia, fatigue, difficulty concentrating, anxiety, loss of energy/fatigue, disturbed sleep, (Hypo) Manic Symptoms:  Irritable Mood, Labiality of Mood, Anxiety Symptoms:  Excessive Worry, Psychotic Symptoms:  denies PTSD Symptoms: Had a traumatic exposure:  abuse Re-experiencing:  Intrusive Thoughts Nightmares Total Time spent with patient: 45 minutes  Past Psychiatric History:   Risk to Self: Is patient at risk for suicide?: Yes Risk to Others:  No Prior Inpatient Therapy:  [facility] mostly rehabs Prior Outpatient Therapy:  In [place], here start seeing a counselor for EMDR   Alcohol Screening: 1. How often do you have a drink containing alcohol?: 4 or more times a week 2. How many drinks containing alcohol do you have on a typical day when you are drinking?: 7, 8, or 9 3. How often do you have six or more drinks on one occasion?: Daily or almost daily Preliminary Score: 7 4. How often during the last year have you found that you were not able to stop drinking once you had started?: Monthly 5. How often during the last year have you failed to do what was normally expected from you becasue of drinking?: Monthly 6. How often during the last year have you needed a first drink in the morning to get yourself going after a heavy drinking session?: Monthly 7. How often during the last year have you had a feeling of guilt of remorse after drinking?: Monthly 8. How often during the last year  have you been unable to remember what happened the night before because you had been drinking?: Never 9. Have you or someone else been injured as a result of your drinking?: Yes, but not in the last year 10. Has a relative or friend or a doctor or another health worker been concerned about your drinking or suggested you cut down?:  Yes, during the last year Alcohol Use Disorder Identification Test Final Score (AUDIT): 25 Brief Intervention: Yes Substance Abuse History in the last 12 months:  Yes.   Consequences of Substance Abuse: Legal Consequences:  one DWI Withdrawal Symptoms:   Diaphoresis Tremors Previous Psychotropic Medications: Yes Zoloft Remeron Clonidine Trazodone Prozac Effexor Wellbutrin Klonopin Seroquel  Psychological Evaluations: No  Past Medical History:  Past Medical History  Diagnosis Date  . Anxiety   . Depression   . Substance abuse   . Allergy   . PTSD (post-traumatic stress disorder)     Past Surgical History  Procedure Laterality Date  . Breast biopsy    . Wisdom tooth extraction     Family History:  Family History  Problem Relation Age of Onset  . Cancer Mother   . Heart disease Father   . COPD Maternal Grandfather   . Cancer Paternal Grandmother    Family Psychiatric  History: Denies family history of psychiatric diosrders Social History:  History  Alcohol Use  . Yes    Comment: daily usage 1-2 bottles of wine     History  Drug Use  . Yes  . Special: Cocaine    Social History   Social History  . Marital Status: Single    Spouse Name: N/A  . Number of Children: N/A  . Years of Education: N/A   Social History Main Topics  . Smoking status: Current Every Day Smoker -- 1.00 packs/day for 6 years    Types: Cigarettes  . Smokeless tobacco: None  . Alcohol Use: Yes     Comment: daily usage 1-2 bottles of wine  . Drug Use: Yes    Special: Cocaine  . Sexual Activity: No   Other Topics Concern  . 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 Divorce has the one daughter that her ex took and ran away with. Has not been able to get her life back together, will apply for disability. Additional Social History:                         Allergies:  No Known Allergies Lab Results:  Results for orders placed  or performed during the hospital encounter of 05/16/15 (from the past 48 hour(s))  CBC with Differential/Platelet     Status: Abnormal   Collection Time: 05/16/15 12:57 AM  Result Value Ref Range   WBC 3.7 (L) 4.0 - 10.5 K/uL   RBC 4.03 3.87 - 5.11 MIL/uL   Hemoglobin 14.2 12.0 - 15.0 g/dL   HCT 16.1 09.6 - 04.5 %   MCV 105.7 (H) 78.0 - 100.0 fL   MCH 35.2 (H) 26.0 - 34.0 pg   MCHC 33.3 30.0 - 36.0 g/dL   RDW 40.9 81.1 - 91.4 %   Platelets 187 150 - 400 K/uL   Neutrophils Relative % 44 %   Neutro Abs 1.6 (L) 1.7 - 7.7 K/uL   Lymphocytes Relative 37 %   Lymphs Abs 1.4 0.7 - 4.0 K/uL   Monocytes Relative 18 %   Monocytes  Absolute 0.7 0.1 - 1.0 K/uL   Eosinophils Relative 0 %   Eosinophils Absolute 0.0 0.0 - 0.7 K/uL   Basophils Relative 1 %   Basophils Absolute 0.0 0.0 - 0.1 K/uL  Ethanol     Status: Abnormal   Collection Time: 05/16/15 12:58 AM  Result Value Ref Range   Alcohol, Ethyl (B) 419 (HH) <5 mg/dL    Comment:        LOWEST DETECTABLE LIMIT FOR SERUM ALCOHOL IS 5 mg/dL FOR MEDICAL PURPOSES ONLY CRITICAL RESULT CALLED TO, READ BACK BY AND VERIFIED WITH: INMAN,J RN [phone] COVINGTON,N CORRECT DATE OF CALL 05/16/2015   I-Stat Beta hCG blood, ED ([facility], [facility], [facility] only)     Status: None   Collection Time: 05/16/15  1:01 AM  Result Value Ref Range   I-stat hCG, quantitative <5.0 <5 mIU/mL   Comment 3            Comment:   GEST. AGE      CONC.  (mIU/mL)   <=1 WEEK        5 - 50     2 WEEKS       50 - 500     3 WEEKS       100 - 10,000     4 WEEKS     1,000 - 30,000        FEMALE AND NON-PREGNANT FEMALE:     LESS THAN 5 mIU/mL   I-stat chem 8, ed     Status: Abnormal   Collection Time: 05/16/15  1:03 AM  Result Value Ref Range   Sodium 143 135 - 145 mmol/L   Potassium 4.0 3.5 - 5.1 mmol/L   Chloride 105 101 - 111 mmol/L   BUN 10 6 - 20 mg/dL   Creatinine, Ser 9.60 0.44 - 1.00 mg/dL   Glucose, Bld 454 (H) 65 - 99 mg/dL   Calcium, Ion 0.98 (L) 1.12 - 1.23 mmol/L    TCO2 23 0 - 100 mmol/L   Hemoglobin 15.6 (H) 12.0 - 15.0 g/dL   HCT 11.9 14.7 - 82.9 %  Urine rapid drug screen (hosp performed)     Status: None   Collection Time: 05/16/15  1:20 AM  Result Value Ref Range   Opiates NONE DETECTED NONE DETECTED   Cocaine NONE DETECTED NONE DETECTED   Benzodiazepines NONE DETECTED NONE DETECTED   Amphetamines NONE DETECTED NONE DETECTED   Tetrahydrocannabinol NONE DETECTED NONE DETECTED   Barbiturates NONE DETECTED NONE DETECTED    Comment:        DRUG SCREEN FOR MEDICAL PURPOSES ONLY.  IF CONFIRMATION IS NEEDED FOR ANY PURPOSE, NOTIFY LAB WITHIN 5 DAYS.        LOWEST DETECTABLE LIMITS FOR URINE DRUG SCREEN Drug Class       Cutoff (ng/mL) Amphetamine      1000 Barbiturate      200 Benzodiazepine   200 Tricyclics       300 Opiates          300 Cocaine          300 THC              50     Metabolic Disorder Labs:  No results found for: HGBA1C, MPG No results found for: PROLACTIN No results found for: CHOL, TRIG, HDL, CHOLHDL, VLDL, LDLCALC  Current Medications: Current Facility-Administered Medications  Medication Dose Route Frequency Provider Last Rate Last Dose  . acetaminophen (TYLENOL) tablet 650 mg  650 mg Oral Q4H PRN Charm Rings, NP      . alum & mag hydroxide-simeth (MAALOX/MYLANTA) 200-200-20 MG/5ML suspension 30 mL  30 mL Oral PRN Charm Rings, NP      . hydrOXYzine (ATARAX/VISTARIL) tablet 25 mg  25 mg Oral Q6H PRN Charm Rings, NP      . ibuprofen (ADVIL,MOTRIN) tablet 600 mg  600 mg Oral Q8H PRN Charm Rings, NP      . loperamide (IMODIUM) capsule 2-4 mg  2-4 mg Oral PRN Charm Rings, NP      . LORazepam (ATIVAN) tablet 1 mg  1 mg Oral Q6H PRN Charm Rings, NP      . LORazepam (ATIVAN) tablet 1 mg  1 mg Oral QID Charm Rings, NP       Followed by  . [START ON 05/17/2015] LORazepam (ATIVAN) tablet 1 mg  1 mg Oral TID Charm Rings, NP       Followed by  . [START ON 05/18/2015] LORazepam (ATIVAN) tablet 1 mg  1 mg  Oral BID Charm Rings, NP       Followed by  . [START ON 05/20/2015] LORazepam (ATIVAN) tablet 1 mg  1 mg Oral Daily Charm Rings, NP      . magnesium hydroxide (MILK OF MAGNESIA) suspension 30 mL  30 mL Oral Daily PRN Charm Rings, NP      . multivitamin with minerals tablet 1 tablet  1 tablet Oral Daily Charm Rings, NP      . ondansetron [facility]) tablet 4 mg  4 mg Oral Q8H PRN Charm Rings, NP      . ondansetron (ZOFRAN-ODT) disintegrating tablet 4 mg  4 mg Oral Q6H PRN Charm Rings, NP      . Melene Muller ON 05/17/2015] sertraline (ZOLOFT) tablet 100 mg  100 mg Oral Daily Thermon Leyland, NP      . thiamine (B-1) injection 100 mg  100 mg Intramuscular Once Charm Rings, NP   100 mg at 05/16/15 1600  . [START ON 05/17/2015] thiamine (VITAMIN B-1) tablet 100 mg  100 mg Oral Daily Charm Rings, NP      . traZODone (DESYREL) tablet 100 mg  100 mg Oral QHS PRN Thermon Leyland, NP       PTA Medications: Prescriptions prior to admission  Medication Sig Dispense Refill Last Dose  . hydrOXYzine (ATARAX/VISTARIL) 25 MG tablet Take 1-2 tablets (25-50 mg total) by mouth every 8 (eight) hours as needed for anxiety. 90 tablet 1 Past Week at Unknown time  . sertraline (ZOLOFT) 100 MG tablet Take 1 tablet (100 mg total) by mouth daily. 30 tablet 0 05/15/2015 at Unknown time  . traZODone (DESYREL) 100 MG tablet Take 2 tablets (200 mg total) by mouth at bedtime. 20 tablet 0 05/14/2015 at Unknown time  . gabapentin (NEURONTIN) 100 MG capsule Take 2 capsules (200 mg total) by mouth 3 (three) times daily. 60 capsule 0 More than a month at Unknown time    Musculoskeletal: Strength & Muscle Tone: within normal limits Gait & Station: unsteady Patient leans: normal  Psychiatric Specialty Exam: Physical Exam  Review of Systems  Constitutional: Positive for malaise/fatigue.  Eyes: Negative.   Respiratory: Negative.        Pack a day  Cardiovascular: Negative.   Gastrointestinal: Positive for nausea and  vomiting.  Genitourinary: Negative.   Musculoskeletal: Negative.   Skin: Negative.   Neurological:  Positive for dizziness, weakness and headaches.  Psychiatric/Behavioral: Positive for depression and substance abuse. Negative for suicidal ideas, hallucinations and memory loss. The patient is nervous/anxious and has insomnia.     Blood pressure 122/94, pulse 94, temperature 97.7 F (36.5 C), temperature source Oral, resp. rate 16, height 5' 6.5" (1.689 m), weight 61.236 kg (135 lb), SpO2 100 %.Body mass index is 21.47 kg/(m^2).  General Appearance: Disheveled  Eye Solicitor::  Fair  Speech:  Clear and Coherent and Slow  Volume:  Decreased  Mood:  Anxious and Depressed  Affect:  Depressed and anxious worried  Thought Process:  Coherent and Goal Directed  Orientation:  Full (Time, Place, and Person)  Thought Content:  symptoms events worries concerns  Suicidal Thoughts:  No  Homicidal Thoughts:  No  Memory:  Immediate;   Fair Recent;   Fair Remote;   Fair  Judgement:  Fair  Insight:  Present and Shallow  Psychomotor Activity:  Restlessness  Concentration:  Fair  Recall:  [employer] of Knowledge:Fair  Language: Fair  Akathisia:  No  Handed:  Right  AIMS (if indicated):     Assets:  Communication Skills Desire for Improvement Housing Resilience  ADL's:  Intact  Cognition: WNL  Sleep:        Treatment Plan Summary: Daily contact with patient to assess and evaluate symptoms and progress in treatment and Medication management Supportive approach/coping skills Alcohol dependence: Ativan detox protocol/work a relapse prevention plan Depression; resume the Zoloft Insomnia; resume Trazodone 100 mg hs prn  Work with CBT/mindfulness Observation Level/Precautions:  Continuous Observation  Laboratory:  As per the ED  Psychotherapy:  Individual/group  Medications: Ativan detox protocol/resume the Zoloft   Consultations:  None  Discharge Concerns:  Need for rehab  Estimated LOS:  Less than 48 hours  Other:  Counselor to explore options to address her continued alcohol use    Abbygael Curtiss, NP-C 1/30/20174:58 PM

## 2015-05-16 NOTE — [facility] Counselor (Signed)
Psych disposition: Per Ijeoma Nwaeze, NP, Pt is [facility] under the care of Dr Lucianne Muss. Per Binnie Rail, AC, Pt will need to come after shift change, as well as once pt's BAL < 200.    Please call [facility] with any questions: [phone], [phone], [phone], [phone], [phone].

## 2015-05-16 NOTE — ED Notes (Signed)
Called report to [facility] for transport

## 2015-05-16 NOTE — ED Provider Notes (Signed)
CSN: [id number]     Arrival date & time 05/16/15  0008 History  By signing my name below, I, Budd Palmer, attest that this documentation has been prepared under the direction and in the presence of Markeria Goetsch, MD. Electronically Signed: Budd Palmer, ED Scribe. 05/16/2015. 2:26 AM.     Chief Complaint  Patient presents with  . Alcohol Problem   Patient is a 45 y.o. female presenting with alcohol problem. The history is provided by the patient. No language interpreter was used.  Alcohol Problem This is a chronic problem. The current episode started more than 1 week ago (restarted alcohol a week ago). The problem occurs constantly. The problem has not changed since onset.Pertinent negatives include no chest pain, no abdominal pain, no headaches and no shortness of breath. Nothing aggravates the symptoms. Nothing relieves the symptoms. She has tried nothing for the symptoms. The treatment provided no relief.   HPI Comments: Dawn Jackson is a 45 y.o. female smoker at 1 ppd with a PMHx of substance abuse brought in by ambulance who presents to the Emergency Department complaining of having an alcohol problem. Pt states she is "addicted to alcohol." She drank one large bottle of wine today, and has done so once before, 7 days ago. She notes that she usually does not drink at all and is going to [employer], but has now relapsed. She states she has been in [place] since 2011. She reports associated SI, which is why she came to the ED, and notes this has improved since arrival. She states her plan was to throw herself off of her balcony. She denies use of any other illegal drugs. She notes a PMHx of cocaine abuse, but states she is now clean. She reports past admissions for SI to [facility]. Pt denies auditory and visual hallucinations.   Past Medical History  Diagnosis Date  . Anxiety   . Depression   . Substance abuse   . Allergy   . PTSD (post-traumatic stress disorder)    Past Surgical History   Procedure Laterality Date  . Breast biopsy    . Wisdom tooth extraction     Family History  Problem Relation Age of Onset  . Cancer Mother   . Heart disease Father   . COPD Maternal Grandfather   . Cancer Paternal Grandmother    Social History  Substance Use Topics  . Smoking status: Current Every Day Smoker -- 1.00 packs/day for 6 years    Types: Cigarettes  . Smokeless tobacco: None  . Alcohol Use: Yes     Comment: daily usage 1-2 bottles of wine   OB History    No data available     Review of Systems  Respiratory: Negative for shortness of breath.   Cardiovascular: Negative for chest pain.  Gastrointestinal: Negative for abdominal pain.  Neurological: Negative for headaches.  Psychiatric/Behavioral: Positive for suicidal ideas. Negative for hallucinations.  All other systems reviewed and are negative.   Allergies  Review of patient's allergies indicates no known allergies.  Home Medications   Prior to Admission medications   Medication Sig Start Date End Date Taking? Authorizing Provider  sertraline (ZOLOFT) 100 MG tablet Take 1 tablet (100 mg total) by mouth daily. 03/21/15  Yes Oneta Rack, NP  gabapentin (NEURONTIN) 100 MG capsule Take 2 capsules (200 mg total) by mouth 3 (three) times daily. Patient not taking: Reported on 04/25/2015 03/21/15   Oneta Rack, NP  hydrOXYzine (ATARAX/VISTARIL) 25 MG tablet Take  1-2 tablets (25-50 mg total) by mouth every 8 (eight) hours as needed for anxiety. Patient not taking: Reported on 05/16/2015 04/25/15   Collie Siad English, PA  LORazepam (ATIVAN) 1 MG tablet Take 1 tablet (1 mg total) by mouth as directed. 4 times, 3 times, 2 times, 1 time, per day over 4 days (as needed for anxiety) Patient not taking: Reported on 05/16/2015 03/21/15   Beau Fanny, FNP  oxyCODONE-acetaminophen (PERCOCET/ROXICET) 5-325 MG tablet Take 1 tablet by mouth every 8 (eight) hours as needed for severe pain. Patient not taking: Reported on  04/25/2015 03/21/15   Beau Fanny, FNP  traZODone (DESYREL) 100 MG tablet Take 2 tablets (200 mg total) by mouth at bedtime. 03/21/15   Oneta Rack, NP   BP 114/84 mmHg  Pulse 69  Temp(Src) 98.9 F (37.2 C) (Oral)  Resp 18  SpO2 95% Physical Exam  Constitutional: She is oriented to person, place, and time. She appears well-developed and well-nourished.  HENT:  Head: Normocephalic and atraumatic.  Mouth/Throat: Oropharynx is clear and moist. No oropharyngeal exudate.  Eyes: Conjunctivae and EOM are normal. Pupils are equal, round, and reactive to light. Right eye exhibits no discharge. Left eye exhibits no discharge.  Neck: Neck supple. No tracheal deviation present.  No bruits  Cardiovascular: Normal rate, regular rhythm and normal heart sounds.   Pulmonary/Chest: Effort normal and breath sounds normal. No stridor. No respiratory distress. She has no wheezes. She has no rales.  Abdominal: Soft. Bowel sounds are normal. She exhibits no mass. There is no tenderness. There is no rebound and no guarding.  Neurological: She is alert and oriented to person, place, and time. She has normal reflexes. Coordination normal.  Skin: Skin is warm and dry. No rash noted. She is not diaphoretic. No erythema.  Psychiatric: She has a normal mood and affect.  Nursing note and vitals reviewed.   ED Course  Procedures  DIAGNOSTIC STUDIES: Oxygen Saturation is 95% on RA, adequate by my interpretation.    COORDINATION OF CARE: 2:20 AM - Discussed plans to review lab results and have pt speak to a behavioral therapist. Pt advised of plan for treatment and pt agrees.  Labs Review Labs Reviewed  CBC WITH DIFFERENTIAL/PLATELET - Abnormal; Notable for the following:    WBC 3.7 (*)    MCV 105.7 (*)    MCH 35.2 (*)    Neutro Abs 1.6 (*)    All other components within normal limits  ETHANOL - Abnormal; Notable for the following:    Alcohol, Ethyl (B) 419 (*)    All other components within normal  limits  I-STAT CHEM 8, ED - Abnormal; Notable for the following:    Glucose, Bld 154 (*)    Calcium, Ion 1.08 (*)    Hemoglobin 15.6 (*)    All other components within normal limits  URINE RAPID DRUG SCREEN, HOSP PERFORMED  I-STAT BETA HCG BLOOD, ED ([facility], [facility], [facility] ONLY)    Imaging Review No results found. I have personally reviewed and evaluated these images and lab results as part of my medical decision-making.   EKG Interpretation None      MDM   Final diagnoses:  None     Has had same symptoms in the past and was admitted to [facility].  dispo per TTS.   I personally performed the services described in this documentation, which was scribed in my presence. The recorded information has been reviewed and is accurate.  Cy Blamer, MD 05/16/15 [phone]

## 2015-05-16 NOTE — Progress Notes (Signed)
NP contacted and received order for IM phenergan. Medication given as ordered. Pt is under constant observation. Safety maintained on the unit.

## 2015-05-16 NOTE — ED Notes (Signed)
PT via [facility] with c/o seeking detox. States she drinks 750 ml of Chardonnay binge drinking, prior to that once monthly. Hx of alcohol abuse and depression.

## 2015-05-16 NOTE — ED Notes (Signed)
Escorted to bathroom for urine sample.
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
## 2015-05-17 DIAGNOSIS — F1094 Alcohol use, unspecified with alcohol-induced mood disorder: Secondary | ICD-10-CM | POA: Diagnosis not present

## 2015-05-17 MED ORDER — ADULT MULTIVITAMIN W/MINERALS CH: 1.0000 | ORAL_TABLET | Freq: Every day | ORAL | Status: DC

## 2015-05-17 MED ORDER — THIAMINE HCL 100 MG PO TABS: 100.0000 mg | ORAL_TABLET | Freq: Every day | ORAL | Status: DC

## 2015-05-17 MED ORDER — TRAZODONE HCL 100 MG PO TABS: 200.0000 mg | ORAL_TABLET | Freq: Every day | ORAL | Status: DC

## 2015-05-17 MED ORDER — SERTRALINE HCL 100 MG PO TABS: 100.0000 mg | ORAL_TABLET | Freq: Every day | ORAL | Status: DC

## 2015-05-17 NOTE — Discharge Summary (Signed)
[facility]-Observation Unit Discharge Summary Note  Patient:  Dawn Jackson is an 45 y.o., female MRN:  [id number] DOB:  1971-02-22 Patient phone:  [phone] (home)  Patient address:   [address] [place] [address],  Total Time spent with patient: 30 minutes  Date of Admission:  05/16/2015 Date of Discharge: 05/17/2015  Reason for Admission: Alcohol detox  Principal Problem: Alcohol-induced mood disorder [facility]) Discharge Diagnoses: Patient Active Problem List   Diagnosis Date Noted  . Alcohol-induced mood disorder (HCC) [F10.94] 05/16/2015  . Alcohol dependence with uncomplicated withdrawal (HCC) [F10.230]   . Alcohol use disorder, severe, dependence (HCC) [F10.20] 03/17/2015  . PTSD (post-traumatic stress disorder) [F43.10] 02/02/2015  . Severe recurrent major depression without psychotic features (HCC) [F33.2] 02/02/2015    Past Psychiatric History: PTSD, MDD, Alcohol Disorder  Past Medical History:  Past Medical History  Diagnosis Date  . Anxiety   . Depression   . Substance abuse   . Allergy   . PTSD (post-traumatic stress disorder)     Past Surgical History  Procedure Laterality Date  . Breast biopsy    . Wisdom tooth extraction     Family History:  Family History  Problem Relation Age of Onset  . Cancer Mother   . Heart disease Father   . COPD Maternal Grandfather   . Cancer Paternal Grandmother    Family Psychiatric  History: SEE SRA Social History:  History  Alcohol Use  . Yes    Comment: daily usage 1-2 bottles of wine     History  Drug Use  . Yes  . Special: Cocaine    Social History   Social History  . Marital Status: Single    Spouse Name: N/A  . Number of Children: N/A  . Years of Education: N/A   Social History Main Topics  . Smoking status: Current Every Day Smoker -- 1.00 packs/day for 6 years    Types: Cigarettes  . Smokeless tobacco: None  . Alcohol Use: Yes     Comment: daily usage 1-2 bottles of wine  . Drug Use:  Yes    Special: Cocaine  . Sexual Activity: No   Other Topics Concern  . None   Social History Narrative    Hospital Course:  Dawn Jackson was admitted for Alcohol-induced mood disorder [facility]) , with psychosis and crisis management.  Pt was treated discharged with the medications listed below under Medication List.  Medical problems were identified and treated as needed.  Home medications were restarted as appropriate. Her Zoloft 100 mg daily was continued for treatment of depression. She reported that the Neurontin that was prescribed during her last admission had not been helpful for her symptoms and was not interested in restarting the medication. Patient was started on the Ativan detox protocol to manage her symptoms of withdrawal.   Improvement was monitored by observation and Dawn Jackson 's daily report of symptom reduction. Upon arrival to the Observation unit the patient was noted to be experiencing active withdrawal symptoms such as excessive vomiting. This improved greatly after she received a one time dose of phenergan IM.  Emotional and mental status was monitored by daily self-inventory reports completed by Dawn Jackson and clinical staff.         Dawn Jackson was evaluated by the treatment team for stability and plans for continued recovery upon discharge. Dawn Jackson 's motivation was an integral factor for scheduling further treatment. During assessment on the morning  of 05/17/2015 the patient reported that her withdrawal symptoms had greatly reduced and that she felt stable to return home. Patient denied any active suicidal ideation. She was observed eating her breakfast in no acute distress. Employment, transportation, bed availability, health status, family support, and any pending legal issues were also considered during hospital stay. Pt was offered further treatment options upon discharge including Outpatient treatment to address her chronic use of alcohol.  Dawn Jackson  will follow up with the services as listed below under Follow Up Information. Patient reported that she had prescriptions for her psychiatric medications already from her outpatient Provider.  She expressed a desire to further address her issues with chronic alcohol dependence on an outpatient basis at [facility] in [place], [place].  Upon completion of this admission the patient was both mentally and medically stable for discharge denying suicidal/homicidal ideation, auditory/visual/tactile hallucinations, delusional thoughts and paranoia.     Physical Findings: AIMS: Facial and Oral Movements Muscles of Facial Expression: None, normal Lips and Perioral Area: None, normal Jaw: None, normal Tongue: None, normal,Extremity Movements Upper (arms, wrists, hands, fingers): None, normal Lower (legs, knees, ankles, toes): None, normal, Trunk Movements Neck, shoulders, hips: None, normal, Overall Severity Severity of abnormal movements (highest score from questions above): None, normal Incapacitation due to abnormal movements: None, normal Patient's awareness of abnormal movements (rate only patient's report): No Awareness, Dental Status Current problems with teeth and/or dentures?: No Does patient usually wear dentures?: No  CIWA:  CIWA-Ar Total: 3 COWS:  COWS Total Score: 10  Musculoskeletal: Strength & Muscle Tone: within normal limits Gait & Station: normal Patient leans: N/A  Psychiatric Specialty Exam:  Review of Systems  Constitutional: Negative.   HENT: Negative.   Eyes: Negative.   Respiratory: Negative.   Cardiovascular: Negative.   Gastrointestinal: Negative.   Genitourinary: Negative.   Musculoskeletal: Negative.   Skin: Negative.   Neurological: Negative.   Endo/Heme/Allergies: Negative.   Psychiatric/Behavioral: Positive for substance abuse (Recent increased use of alcohol ). Negative for suicidal ideas, hallucinations and memory loss. Depression: stable. The patient does not  have insomnia. Nervous/anxious: stable.   All other systems reviewed and are negative.   Blood pressure 120/81, pulse 109, temperature 98.7 F (37.1 C), temperature source Oral, resp. rate 18, height 5' 6.5" (1.689 m), weight 61.236 kg (135 lb), SpO2 99 %.Body mass index is 21.47 kg/(m^2).  Have you used any form of tobacco in the last 30 days? (Cigarettes, Smokeless Tobacco, Cigars, and/or Pipes): Yes  Has this patient used any form of tobacco in the last 30 days? (Cigarettes, Smokeless Tobacco, Cigars, and/or Pipes) Yes, Yes, A prescription for an FDA-approved tobacco cessation medication was offered at discharge and the patient refused  Metabolic Disorder Labs:  No results found for: HGBA1C, MPG No results found for: PROLACTIN No results found for: CHOL, TRIG, HDL, CHOLHDL, VLDL, LDLCALC  See Psychiatric Specialty Exam and Suicide Risk Assessment completed by Attending Physician prior to discharge.  Discharge destination:  Home  Is patient on multiple antipsychotic therapies at discharge:  No   Has Patient had three or more failed trials of antipsychotic monotherapy by history:  No  Recommended Plan for Multiple Antipsychotic Therapies: NA     Medication List    STOP taking these medications        gabapentin 100 MG capsule  Commonly known as:  NEURONTIN      TAKE these medications      Indication   hydrOXYzine 25 MG tablet  Commonly known as:  ATARAX/VISTARIL  Take 1-2 tablets (25-50 mg total) by mouth every 8 (eight) hours as needed for anxiety.      multivitamin with minerals Tabs tablet  Take 1 tablet by mouth daily. May purchase over the counter.   Indication:  Vitamin Supplementation     sertraline 100 MG tablet  Commonly known as:  ZOLOFT  Take 1 tablet (100 mg total) by mouth daily.   Indication:  Major Depressive Disorder, mood stabilization     thiamine 100 MG tablet  Take 1 tablet (100 mg total) by mouth daily.   Indication:  Deficiency in Thiamine or  Vitamin B1     traZODone 100 MG tablet  Commonly known as:  DESYREL  Take 2 tablets (200 mg total) by mouth at bedtime.   Indication:  Trouble Sleeping       Follow-up Information    Call today to follow up.   Why:  drug counseling   Contact information:   [facility] [address] [place], [place] [address]      Follow up with drug counseling. Call today.   Contact information:   [address] [place] [address] [phone]     Follow-up recommendations:  Activity:  as tolerated  Diet:  heart healthy  Comments:Take all medications as prescribed. Keep all follow-up appointments as scheduled.  Do not consume alcohol or use illegal drugs while on prescription medications. Report any adverse effects from your medications to your primary care provider promptly.  In the event of recurrent symptoms or worsening symptoms, call 911, a crisis hotline, or go to the nearest emergency department for evaluation.   SignedFransisca Kaufmann, NP-C 05/17/2015, 1:27 PM

## 2015-05-17 NOTE — Progress Notes (Signed)
Pt. Is in bed and awake at shift change.  This Clinical research associate recognizes Pt. From a few weeks earlier.  Pt. Asking about meds and how early she can have them.  Pt. Wants everything she can have regardless of symptoms.  Pt denies further nausea and wants snacks.  Pt also states she must have nicorette gum and asks for order to be placed by NP. Pt educated that meds are not for uses unless symptoms present.  Writer declined to give pt list of meds. And asked Pt what symptoms she was experiencing. Pt denies SI, HI and AV/H. Pt given PRN meds for sleep and PRN meds for anxiety.  Pt given snacks and drinks. Pt asleep in bed asleep. Pt continuously observed for safety except when in bathroom.

## 2015-05-17 NOTE — Clinical Social Work Note (Addendum)
CSW met with pt and NP at bedside to discuss discharge needs.  Pt discussed feeling better and stated that her withdrawals have settled down.  Pt denied any suicidal or homicidal ideations and denied any hallucinations. She reported "I feel like I can go home".  Pt does have a place to go and stated that her sister will be picking her up today at discharge.  Pt has [facility] for psychotropic medication management. She has a prescription for her Zoloft and stated that she has enough to get her through until her next appointment.  Pt discussed a desire to follow up with [facility] and was provided with the [place] address and phone number for their outpatient services.       LCSW [facility] Observation Unit 

## 2015-05-31 ENCOUNTER — Ambulatory Visit: Payer: Self-pay | Admitting: Physician Assistant

## 2015-08-04 ENCOUNTER — Other Ambulatory Visit: Payer: Self-pay

## 2015-08-04 DIAGNOSIS — Z1231 Encounter for screening mammogram for malignant neoplasm of breast: Secondary | ICD-10-CM

## 2015-08-25 ENCOUNTER — Ambulatory Visit: Payer: Self-pay

## 2015-08-26 ENCOUNTER — Institutional Professional Consult (permissible substitution): Payer: Self-pay | Admitting: Pulmonary Disease

## 2015-08-29 ENCOUNTER — Institutional Professional Consult (permissible substitution): Payer: Self-pay | Admitting: Internal Medicine

## 2015-09-19 ENCOUNTER — Institutional Professional Consult (permissible substitution): Payer: Self-pay | Admitting: Internal Medicine

## 2016-03-09 ENCOUNTER — Telehealth: Payer: Self-pay | Admitting: Hematology

## 2016-03-09 NOTE — Telephone Encounter (Signed)
Pt confirmed appt, verified demo and insurance, faxed referring provider appt date/time

## 2016-03-12 ENCOUNTER — Ambulatory Visit (HOSPITAL_BASED_OUTPATIENT_CLINIC_OR_DEPARTMENT_OTHER): Payer: BLUE CROSS/BLUE SHIELD | Admitting: Hematology

## 2016-03-12 ENCOUNTER — Telehealth: Payer: Self-pay

## 2016-03-12 ENCOUNTER — Encounter: Payer: Self-pay | Admitting: Hematology

## 2016-03-12 VITALS — BP 102/58 | HR 79 | Temp 98.1°F | Resp 18 | Ht 66.5 in | Wt 128.6 lb

## 2016-03-12 DIAGNOSIS — J3489 Other specified disorders of nose and nasal sinuses: Secondary | ICD-10-CM | POA: Insufficient documentation

## 2016-03-12 DIAGNOSIS — F17200 Nicotine dependence, unspecified, uncomplicated: Secondary | ICD-10-CM

## 2016-03-12 DIAGNOSIS — F1410 Cocaine abuse, uncomplicated: Secondary | ICD-10-CM

## 2016-03-12 DIAGNOSIS — F1021 Alcohol dependence, in remission: Secondary | ICD-10-CM | POA: Insufficient documentation

## 2016-03-12 DIAGNOSIS — I82402 Acute embolism and thrombosis of unspecified deep veins of left lower extremity: Secondary | ICD-10-CM

## 2016-03-12 DIAGNOSIS — I824Z2 Acute embolism and thrombosis of unspecified deep veins of left distal lower extremity: Secondary | ICD-10-CM

## 2016-03-12 DIAGNOSIS — Z809 Family history of malignant neoplasm, unspecified: Secondary | ICD-10-CM

## 2016-03-12 DIAGNOSIS — I82409 Acute embolism and thrombosis of unspecified deep veins of unspecified lower extremity: Secondary | ICD-10-CM | POA: Insufficient documentation

## 2016-03-12 DIAGNOSIS — Z832 Family history of diseases of the blood and blood-forming organs and certain disorders involving the immune mechanism: Secondary | ICD-10-CM

## 2016-03-12 DIAGNOSIS — F1411 Cocaine abuse, in remission: Secondary | ICD-10-CM | POA: Insufficient documentation

## 2016-03-12 DIAGNOSIS — Z7901 Long term (current) use of anticoagulants: Secondary | ICD-10-CM | POA: Insufficient documentation

## 2016-03-12 DIAGNOSIS — F1010 Alcohol abuse, uncomplicated: Secondary | ICD-10-CM | POA: Diagnosis not present

## 2016-03-12 NOTE — Telephone Encounter (Signed)
Pt called at 1330 stating she is waiting for the bus and is late for her appt.

## 2016-03-12 NOTE — Progress Notes (Signed)
Marland Kitchen.    HEMATOLOGY/ONCOLOGY CONSULTATION NOTE  Date of Service: 03/12/2016  Patient Care Team: Merri BrunetteWalter Pharr, MD as PCP - General (Internal Medicine)  CHIEF COMPLAINTS/PURPOSE OF CONSULTATION:  New DVT  HISTORY OF PRESENTING ILLNESS:   Dawn Jackson is a wonderful 45 y.o. female who has been referred to us by Dr .Merri BrunettePharr, Walter, MD for evaluation and anticoagulation recommendations regarding new deep venous thrombosis.  Patient has a history of depression, anxiety, alcohol abuse, nicotine dependence, cocaine abuse who reports that she was in [place] when she presented in July 2017 with a left leg swelling and was diagnosed with a DVT. Patient notes that she was seen by Dr.Chitiwalla for hematology evaluation and reports that she had a hypercoagulable workup that was apparently unrevealing. Patient has been a long-term smoker and still smoking half to 1 pack per day. She was started on Eliquis for anticoagulation and knows that her  Left leg swelling is better. She notes that she is been following with a and has been sober for 6 months.  Obvious provoking factors were her ongoing cigarette smoking. Notes no hormonal exposures no leg trauma. Did have issues with left-sided plantar fasciitis in May 2017 and was on oral prednisone and steroid shots which could potentially be additional risk factors. No recent surgeries no pregnancy. Patient has some family history of blood clots over the do not sound unprovoked. She notes her father had a pulmonary embolism in his 6060s and was on Coumadin. Paternal grandfather died of a pulmonary embolism in his 3450s. Mother had a blood clot related to lung cancer.  Patient notes no previous history of venous thromboembolism prior to this No chest pain no shortness of breath.  MEDICAL HISTORY:   SURGICAL HISTORY: Past Surgical History:  Procedure Laterality Date  . BREAST BIOPSY    . WISDOM TOOTH EXTRACTION      SOCIAL HISTORY: Social History     Social History  . Marital status: Single    Spouse name: N/A  . Number of children: N/A  . Years of education: N/A   Occupational History  . Not on file.   Social History Main Topics  . Smoking status: Current Every Day Smoker    Packs/day: 1.00    Years: 6.00    Types: Cigarettes  . Smokeless tobacco: Not on file  . Alcohol use Yes     Comment: daily usage 1-2 bottles of wine  . Drug use:     Types: Cocaine  . Sexual activity: No   Other Topics Concern  . Not on file   Social History Narrative  . No narrative on file    FAMILY HISTORY: Family History  Problem Relation Age of Onset  . Cancer Mother   . Heart disease Father   . COPD Maternal Grandfather   . Cancer Paternal Grandmother     ALLERGIES:  has No Known Allergies.  MEDICATIONS:  Current Outpatient Prescriptions  Medication Sig Dispense Refill  . hydrOXYzine (ATARAX/VISTARIL) 25 MG tablet Take 1-2 tablets (25-50 mg total) by mouth every 8 (eight) hours as needed for anxiety. 90 tablet 1  . Multiple Vitamin (MULTIVITAMIN WITH MINERALS) TABS tablet Take 1 tablet by mouth daily. May purchase over the counter.    . sertraline (ZOLOFT) 100 MG tablet Take 1 tablet (100 mg total) by mouth daily. 30 tablet 0  . thiamine 100 MG tablet Take 1 tablet (100 mg total) by mouth daily.    . traZODone (DESYREL) 100 MG  tablet Take 2 tablets (200 mg total) by mouth at bedtime. 20 tablet 0   No current facility-administered medications for this visit.     REVIEW OF SYSTEMS:    10 Point review of Systems was done is negative except as noted above.  PHYSICAL EXAMINATION: ECOG PERFORMANCE STATUS: 0 - Asymptomatic  . Vitals:   03/12/16 1424  BP: (!) 102/58  Pulse: 79  Resp: 18  Temp: 98.1 F (36.7 C)   Filed Weights   03/12/16 1424  Weight: 128 lb 9.6 oz (58.3 kg)   .Body mass index is 20.45 kg/m.  GENERAL:alert, in no acute distress and comfortable SKIN: skin color, texture, turgor are normal, no rashes  or significant lesions EYES: normal, conjunctiva are pink and non-injected, sclera clear OROPHARYNX:no exudate, no erythema and lips, buccal mucosa, and tongue normal  NECK: supple, no JVD, thyroid normal size, non-tender, without nodularity LYMPH:  no palpable lymphadenopathy in the cervical, axillary or inguinal LUNGS: clear to auscultation with normal respiratory effort HEART: regular rate & rhythm,  no murmurs and no lower extremity edema ABDOMEN: abdomen soft, non-tender, normoactive bowel sounds  Musculoskeletal: minimal swelling of the left calf PSYCH: alert & oriented x 3 with fluent speech NEURO: no focal motor/sensory deficits  LABORATORY DATA:  I have reviewed the data as listed  . CBC Latest Ref Rng & Units 03/19/2016 05/16/2015 05/16/2015  WBC 4.0 - 10.5 K/uL 10.4 - 3.7(L)  Hemoglobin 12.0 - 15.0 g/dL 11.914.3 15.6(H) 14.2  Hematocrit 36.0 - 46.0 % 41.8 46.0 42.6  Platelets 150 - 400 K/uL 226 - 187    . CMP Latest Ref Rng & Units 03/19/2016 05/16/2015 03/14/2015  Glucose 65 - 99 mg/dL 87 147(W154(H) 295(A154(H)  BUN 6 - 20 mg/dL 14 10 15   Creatinine 0.44 - 1.00 mg/dL 2.130.59 0.861.00 5.780.62  Sodium 135 - 145 mmol/L 139 143 140  Potassium 3.5 - 5.1 mmol/L 3.8 4.0 3.8  Chloride 101 - 111 mmol/L 103 105 105  CO2 22 - 32 mmol/L 28 - 22  Calcium 8.9 - 10.3 mg/dL 9.6 - 9.0  Total Protein 6.5 - 8.1 g/dL - - 8.1  Total Bilirubin 0.3 - 1.2 mg/dL - - 1.0  Alkaline Phos 38 - 126 U/L - - 68  AST 15 - 41 U/L - - 181(H)  ALT 14 - 54 U/L - - 56(H)     RADIOGRAPHIC STUDIES: I have personally reviewed the radiological images as listed and agreed with the findings in the report. No results found.  ASSESSMENT & PLAN:   45 year old female with  #1 left lower extremity DVT in July 2017. Likely provoked by her cigarette smoking 1PPD + steroid use for plantar fasciitis. No chest pain or shortness of breath to suggest pulmonary embolism. Poorly defined family history of VTE - indeterminate.  Patient  notes that she was seen by hematology at the [facility] in [place] and had a hypercoagulable workup that was apparently unrevealing. These results are not available to us.  We do not have her ultrasound results from [place] either.  Plan -We will request her outside records from her hematologist at [facility] to get her ultrasound and hypercoagulable workup results. -Irrespective of these findings and since the patient's event appears to be provoked and on account of the fact that the patient's alcohol and polysubstance abuse makes her a poor candidate for long-term anticoagulation - we would recommend anticoagulation with Apixaban for total of 6 months. -After 6 months  of anticoagulation with Apixaban would recommend switching to baby aspirin for long-term use. -Patient counseled on the need for absolute smoking cessation. -Would not repeat ultrasound of the lower extremities unless new symptoms arise. -Patient was counseled on risk medication strategies and recommended to avoid any hormonal exposures. -She was counseled against polysubstance abuse. -continue follow-up with primary care physician for management of other medical comorbidities. -patient was encouraged to continue follow-up with AA and remain sober off her drugs and alcohol.  Plz reconsult if any new questions or concerns arise.  All of the patients questions were answered with apparent satisfaction. The patient knows to call the clinic with any problems, questions or concerns.  I spent 35 minutes counseling the patient face to face. The total time spent in the appointment was 45 minutes and more than 50% was on counseling and direct patient cares.    Wyvonnia Lora MD MS AAHIVMS [facility] Hematology/Oncology Physician [facility]  (Office):       [phone] (Work cell):  [phone] (Fax):           [phone]  03/12/2016 2:23 PM

## 2016-03-19 ENCOUNTER — Encounter (HOSPITAL_COMMUNITY): Payer: Self-pay

## 2016-03-19 ENCOUNTER — Other Ambulatory Visit: Payer: Self-pay

## 2016-03-19 ENCOUNTER — Emergency Department (HOSPITAL_COMMUNITY): Payer: BLUE CROSS/BLUE SHIELD

## 2016-03-19 ENCOUNTER — Emergency Department (HOSPITAL_COMMUNITY)
Admission: EM | Admit: 2016-03-19 | Discharge: 2016-03-19 | Disposition: A | Discharge status: Home / Self Care | Payer: BLUE CROSS/BLUE SHIELD | Attending: Emergency Medicine | Admitting: Emergency Medicine

## 2016-03-19 DIAGNOSIS — I951 Orthostatic hypotension: Secondary | ICD-10-CM

## 2016-03-19 DIAGNOSIS — E860 Dehydration: Secondary | ICD-10-CM

## 2016-03-19 DIAGNOSIS — Z7901 Long term (current) use of anticoagulants: Secondary | ICD-10-CM | POA: Diagnosis not present

## 2016-03-19 DIAGNOSIS — F17210 Nicotine dependence, cigarettes, uncomplicated: Secondary | ICD-10-CM | POA: Insufficient documentation

## 2016-03-19 DIAGNOSIS — R55 Syncope and collapse: Secondary | ICD-10-CM | POA: Insufficient documentation

## 2016-03-19 LAB — CBC
HCT: 41.8 % (ref 36.0–46.0)
Hemoglobin: 14.3 g/dL (ref 12.0–15.0)
MCH: 32.9 pg (ref 26.0–34.0)
MCHC: 34.2 g/dL (ref 30.0–36.0)
MCV: 96.1 fL (ref 78.0–100.0)
Platelets: 226 10*3/uL (ref 150–400)
RBC: 4.35 MIL/uL (ref 3.87–5.11)
RDW: 12.9 % (ref 11.5–15.5)
WBC: 10.4 10*3/uL (ref 4.0–10.5)

## 2016-03-19 LAB — BASIC METABOLIC PANEL
Anion gap: 8 (ref 5–15)
BUN: 14 mg/dL (ref 6–20)
CO2: 28 mmol/L (ref 22–32)
Calcium: 9.6 mg/dL (ref 8.9–10.3)
Chloride: 103 mmol/L (ref 101–111)
Creatinine, Ser: 0.59 mg/dL (ref 0.44–1.00)
GFR calc Af Amer: >60 mL/min (ref >60)
GFR calc non Af Amer: >60 mL/min (ref >60)
Glucose, Bld: 87 mg/dL (ref 65–99)
Potassium: 3.8 mmol/L (ref 3.5–5.1)
Sodium: 139 mmol/L (ref 135–145)

## 2016-03-19 LAB — URINALYSIS, ROUTINE W REFLEX MICROSCOPIC
Bilirubin Urine: NEGATIVE
Glucose, UA: NEGATIVE mg/dL
Hgb urine dipstick: NEGATIVE
Ketones, ur: NEGATIVE mg/dL
Leukocytes, UA: NEGATIVE
Nitrite: NEGATIVE
Protein, ur: NEGATIVE mg/dL
Specific Gravity, Urine: >1.046 — ABNORMAL HIGH (ref 1.005–1.030)
pH: 8 (ref 5.0–8.0)

## 2016-03-19 LAB — I-STAT BETA HCG BLOOD, ED (MC, WL, AP ONLY): I-stat hCG, quantitative: <5 m[IU]/mL (ref <5)

## 2016-03-19 LAB — CBG MONITORING, ED: Glucose-Capillary: 73 mg/dL (ref 65–99)

## 2016-03-19 MED ORDER — IOPAMIDOL (ISOVUE-370) INJECTION 76%
100.0000 mL | Freq: Once | INTRAVENOUS | Status: AC | PRN
  Administered 2016-03-19: 100 mL via INTRAVENOUS

## 2016-03-19 MED ORDER — SODIUM CHLORIDE 0.9 % IJ SOLN
INTRAMUSCULAR | Status: AC
  Filled 2016-03-19: qty 50

## 2016-03-19 MED ORDER — IOPAMIDOL (ISOVUE-370) INJECTION 76%
INTRAVENOUS | Status: AC
  Filled 2016-03-19: qty 100

## 2016-03-19 MED ORDER — SODIUM CHLORIDE 0.9 % IV BOLUS (SEPSIS)
1000.0000 mL | Freq: Once | INTRAVENOUS | Status: AC
  Administered 2016-03-19: 1000 mL via INTRAVENOUS

## 2016-03-19 NOTE — ED Notes (Signed)
PT info me no dizziness at this time

## 2016-03-19 NOTE — ED Notes (Signed)
PT have been made aware of urine sample. Info PT to info staff when able to go

## 2016-03-19 NOTE — ED Provider Notes (Signed)
[facility]-EMERGENCY DEPT Provider Note   CSN: [id number] Arrival date & time: 03/19/16  [id number]     History   Chief Complaint Chief Complaint  Patient presents with  . Loss of Consciousness  . Dizziness    HPI Darina Dwan BoltM Smith is a 45 y.o. female.  HPI 45 year old female with past medical history of depression, PTSD, substance abuse who presents with near syncopal episode. Patient was recently diagnosed with a DVT and is currently on Eliquis. She has not missed any doses. She states that she stood up from bed today and got lightheadedness and dizziness. The dizziness was more so that a sensation that she was going to pass out and she denies any room spinning. She states that she laid down and her symptoms resolved and she has since been able to walk without difficulty. She denies any associated chest pain and shortness of breath but is markedly anxious regarding possible blood clot to her lungs. She has no history of PE and has not missed any doses. Her leg swelling has improved since starting Eliquis. Denies any current chest pain. No other medical complaints.   Past Medical History:  Diagnosis Date  . Allergy   . Anxiety   . Depression   . PTSD (post-traumatic stress disorder)   . Substance abuse     Patient Active Problem List   Diagnosis Date Noted  . 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 Alcohol dependence with uncomplicated withdrawal (HCC)   . Alcohol use disorder, severe, dependence (HCC) 03/17/2015  . PTSD (post-traumatic stress disorder) 02/02/2015  . Severe recurrent major depression without psychotic features (HCC) 02/02/2015    Past Surgical History:  Procedure Laterality Date  . BREAST BIOPSY    . WISDOM TOOTH EXTRACTION      OB History    No data available       Home Medications    Prior to Admission medications   Medication Sig Start Date End Date Taking? Authorizing Provider  apixaban (ELIQUIS) 5 MG TABS tablet Take 5 mg by mouth 2 (two) times  daily.  02/07/16  Yes Historical Provider, MD  BIOTIN PO Take by mouth.   Yes Historical Provider, MD  Multiple Vitamin (MULTIVITAMIN WITH MINERALS) TABS tablet Take 1 tablet by mouth daily.   Yes Historical Provider, MD    Family History Family History  Problem Relation Age of Onset  . Cancer Mother   . Heart disease Father   . COPD Maternal Grandfather   . Cancer Paternal Grandmother     Social History Social History  Substance Use Topics  . Smoking status: Current Every Day Smoker    Packs/day: 1.00    Years: 6.00    Types: Cigarettes  . Smokeless tobacco: Not on file  . Alcohol use Yes     Comment: daily usage 1-2 bottles of wine     Allergies   Patient has no known allergies.   Review of Systems Review of Systems  Constitutional: Positive for fatigue. Negative for chills and fever.  HENT: Negative for congestion, rhinorrhea and sore throat.   Eyes: Negative for visual disturbance.  Respiratory: Negative for cough, shortness of breath and wheezing.   Cardiovascular: Positive for leg swelling. Negative for chest pain.  Gastrointestinal: Negative for abdominal pain, diarrhea, nausea and vomiting.  Genitourinary: Negative for dysuria, flank pain, vaginal bleeding and vaginal discharge.  Musculoskeletal: Negative for neck pain.  Skin: Negative for rash.  Allergic/Immunologic: Negative for immunocompromised state.  Neurological: Positive for light-headedness. Negative  for syncope and headaches.  Hematological: Does not bruise/bleed easily.  All other systems reviewed and are negative.    Physical Exam Updated Vital Signs BP 120/83   Pulse 81   Temp 98.4 F (36.9 C) (Oral)   Resp 16   Ht 5\' 7"  (1.702 m)   Wt 130 lb (59 kg)   LMP 03/19/2016 (Approximate)   SpO2 99%   BMI 20.36 kg/m   Physical Exam  Constitutional: She is oriented to person, place, and time. She appears well-developed and well-nourished. No distress.  HENT:  Head: Normocephalic and  atraumatic.  Eyes: Conjunctivae are normal.  Neck: Neck supple.  Cardiovascular: Normal rate, regular rhythm and normal heart sounds.  Exam reveals no friction rub.   No murmur heard. Pulmonary/Chest: Effort normal and breath sounds normal. No respiratory distress. She has no wheezes. She has no rales.  Abdominal: She exhibits no distension.  Musculoskeletal: She exhibits no edema.  Neurological: She is alert and oriented to person, place, and time. She exhibits normal muscle tone.  Skin: Skin is warm. Capillary refill takes less than 2 seconds.  Psychiatric: She has a normal mood and affect.  Nursing note and vitals reviewed.    ED Treatments / Results  Labs (all labs ordered are listed, but only abnormal results are displayed) Labs Reviewed  URINALYSIS, ROUTINE W REFLEX MICROSCOPIC (NOT AT [facility]) - Abnormal; Notable for the following:       Result Value   Specific Gravity, Urine >1.046 (*)    All other components within normal limits  BASIC METABOLIC PANEL  CBC  CBG MONITORING, ED  I-STAT BETA HCG BLOOD, ED ([facility], [facility], [facility] ONLY)    EKG  EKG Interpretation  Date/Time:  Monday March 19 2016 10:11:23 EST Ventricular Rate:  78 PR Interval:    QRS Duration: 90 QT Interval:  374 QTC Calculation: 426 R Axis:   61 Text Interpretation:  Sinus rhythm Nonspecific T abnormalities, lateral leads No old tracing to compare Confirmed by Omarion Minnehan MD, Judythe Postema [phone]) on 03/19/2016 1:41:56 PM       Radiology Ct Angio Chest Pe W And/or Wo Contrast  Result Date: 03/19/2016 CLINICAL DATA:  Syncopal episode.  Left leg DVT. EXAM: CT ANGIOGRAPHY CHEST WITH CONTRAST TECHNIQUE: Multidetector CT imaging of the chest was performed using the standard protocol during bolus administration of intravenous contrast. Multiplanar CT image reconstructions and MIPs were obtained to evaluate the vascular anatomy. CONTRAST:  100 mL Isovue 370 COMPARISON:  None. FINDINGS: Cardiovascular: Satisfactory opacification  of the pulmonary arteries to the segmental level. No evidence of pulmonary embolism. Normal heart size. No pericardial effusion. The thoracic aorta is normal in caliber. Mediastinum/Nodes: No enlarged mediastinal, hilar, or axillary lymph nodes. Thyroid gland, trachea, and esophagus demonstrate no significant findings. Lungs/Pleura: Lungs are clear. No pleural effusion or pneumothorax. Upper Abdomen: No acute abnormality. Musculoskeletal: No chest wall abnormality. No acute or significant osseous findings. Review of the MIP images confirms the above findings. IMPRESSION: 1. No pulmonary embolus. 2. No active cardiopulmonary disease. Electronically Signed   By: Elige Ko   On: 03/19/2016 12:20    Procedures Procedures (including critical care time)  Medications Ordered in ED Medications  sodium chloride 0.9 % bolus 1,000 mL (0 mLs Intravenous Stopped 03/19/16 1407)  iopamidol (ISOVUE-370) 76 % injection 100 mL (100 mLs Intravenous Contrast Given 03/19/16 1205)     Initial Impression / Assessment and Plan / ED Course  I have reviewed the triage vital signs and the nursing  notes.  Pertinent labs & imaging results that were available during my care of the patient were reviewed by me and considered in my medical decision making (see chart for details).  Clinical Course     45 yo F with PMHx as above including recent dx of DVT here with transient near syncopal episode after standing. Primary suspicion is orthostasis, though given recent dx of DVT, PE is also on DDx though HDS at this time. Will check las, orthostatics (though sx now improved), and CTA given concern for PE. Otherwise, will check broad labs. Denies any CP and EKG is non-ischemic with normal intervals - doubt ischemia or arrhythmia. No evidence of SVT, WPW, long QT, or other arrhythmogenic abnormality.  Labs, imaging are as above. UA shows markedly elevated s.g. C/w dehydration, which is likely etiology for pt's orthostatic sx.  Otherwise, CBC normal without anemia. BMP unremarkable. CTA negative. Sx improved with IVF here. Suspect mild orthostatic hypotension - will advise fluids, d/c home with outpt f/u.  Final Clinical Impressions(s) / ED Diagnoses   Final diagnoses:  Orthostatic syncope  Dehydration    New Prescriptions Discharge Medication List as of 03/19/2016  1:45 PM       Shaune Pollackameron Shamon Cothran, MD 03/20/16 [phone]

## 2016-03-19 NOTE — ED Notes (Signed)
Nurse is in the room collecting labs and collecting labs

## 2016-03-19 NOTE — ED Notes (Signed)
At this time PT still unable to give urine sample

## 2016-03-19 NOTE — ED Triage Notes (Signed)
Pt presents with c/o dizziness and a near syncopal episode that occurred today. Pt reports that she woke up this morning and had a moment of dizziness and fell to the floor. Pt was able to resume normal activity after the episode. Pt reports that she does have a blood clot in her leg that is currently being treated with eloquis. Pt also reports that she has had an increase of vaginal bleeding and also reports scratches on her arms. Pt reports that she "feels like she is bleeding all the time".

## 2016-03-21 ENCOUNTER — Ambulatory Visit
Admission: RE | Admit: 2016-03-21 | Discharge: 2016-03-21 | Disposition: A | Discharge status: Home / Self Care | Payer: BLUE CROSS/BLUE SHIELD | Source: Ambulatory Visit

## 2016-03-21 DIAGNOSIS — Z1231 Encounter for screening mammogram for malignant neoplasm of breast: Secondary | ICD-10-CM

## 2016-03-21 IMAGING — MG DIGITAL SCREENING BILATERAL MAMMOGRAM WITH CAD
4 series · 4 of 4 positions shown · non-contrast
Comparison: None.

CLINICAL DATA: Screening.

EXAM:
DIGITAL SCREENING BILATERAL MAMMOGRAM WITH CAD

[L CC]
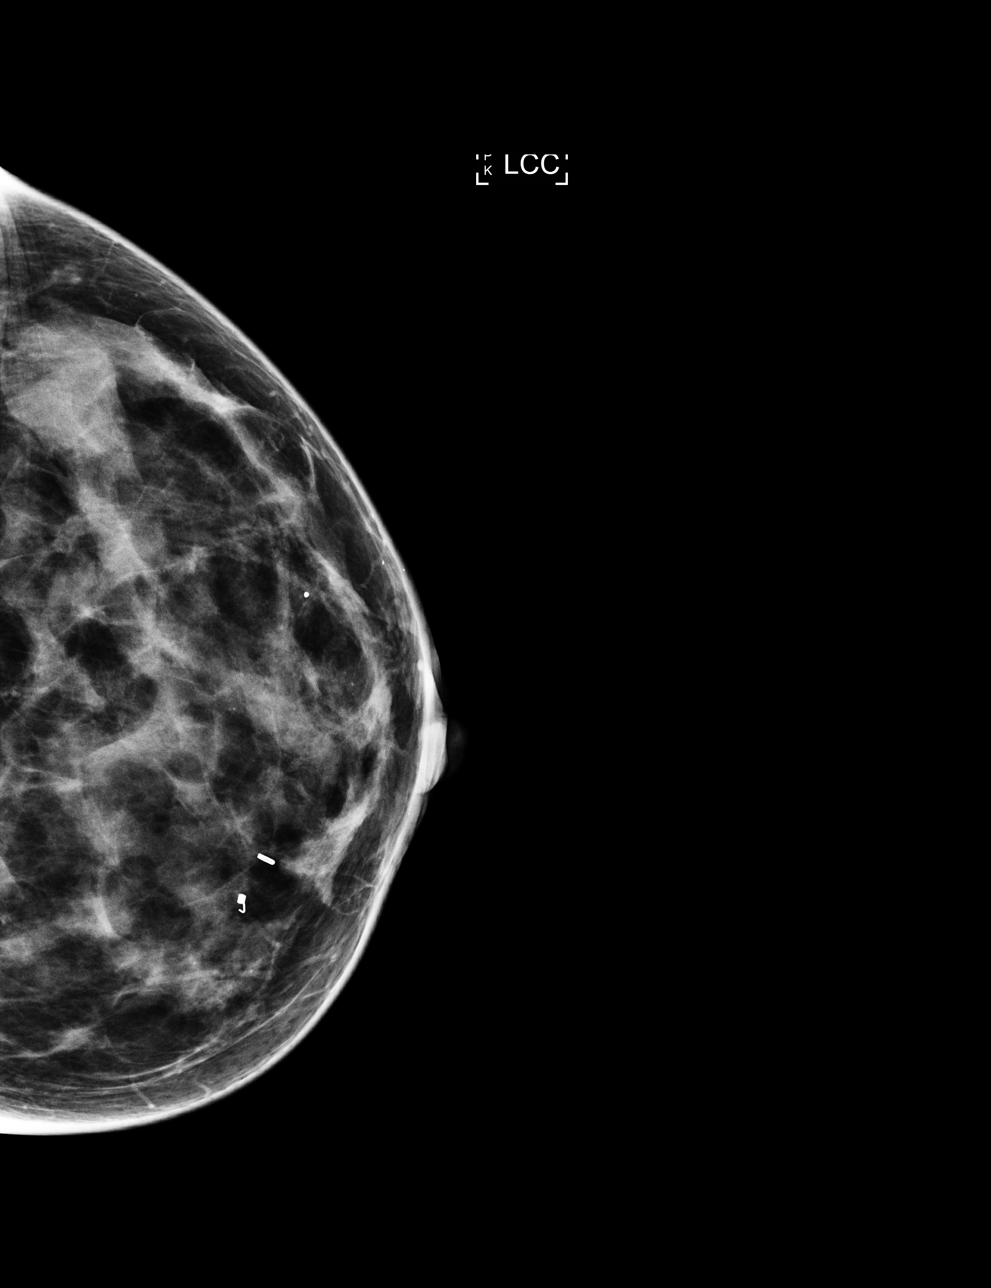

[R MLO]
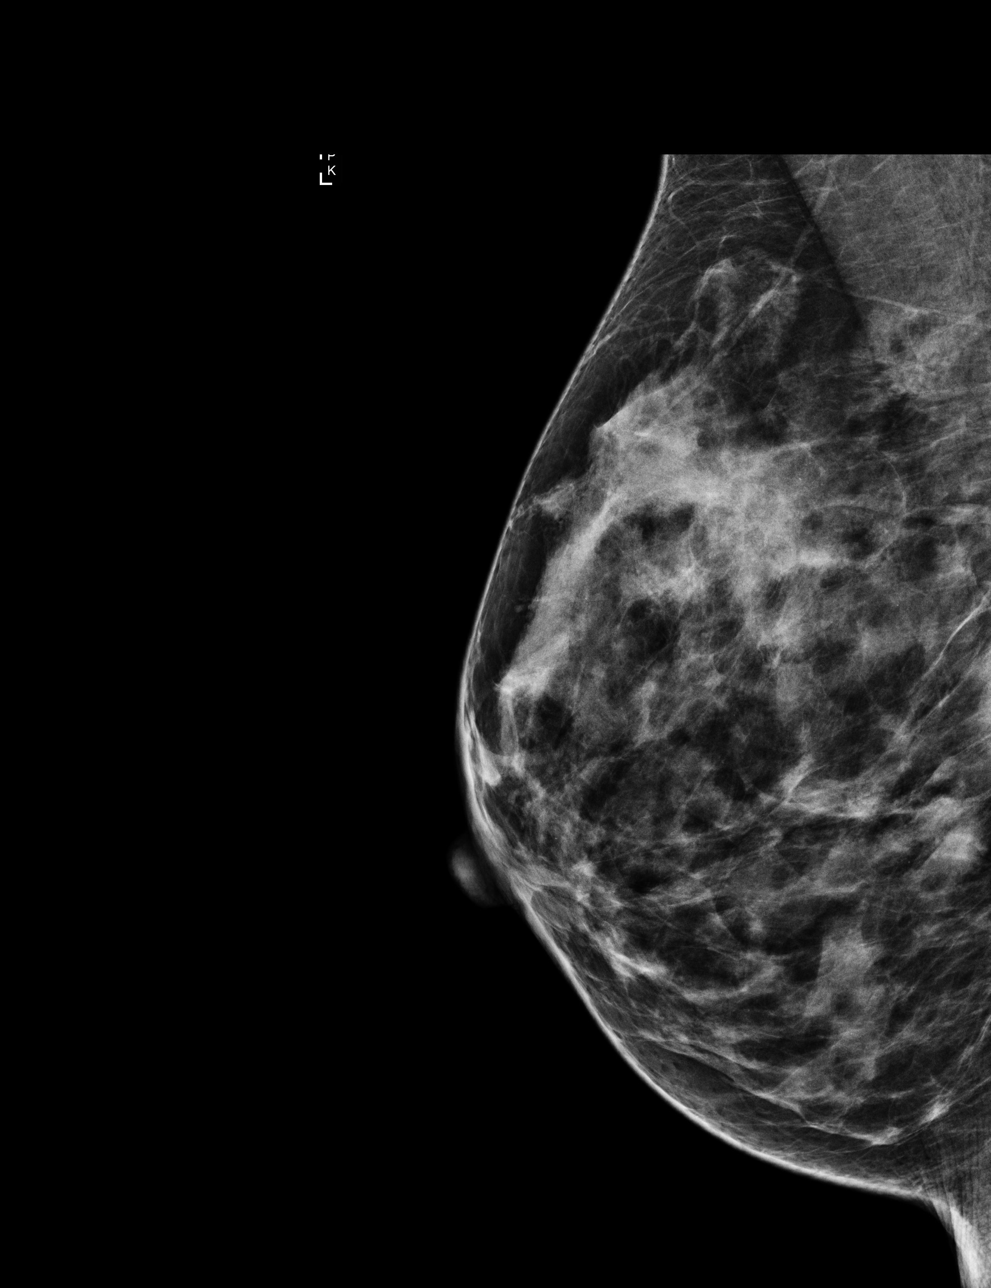

[L MLO]
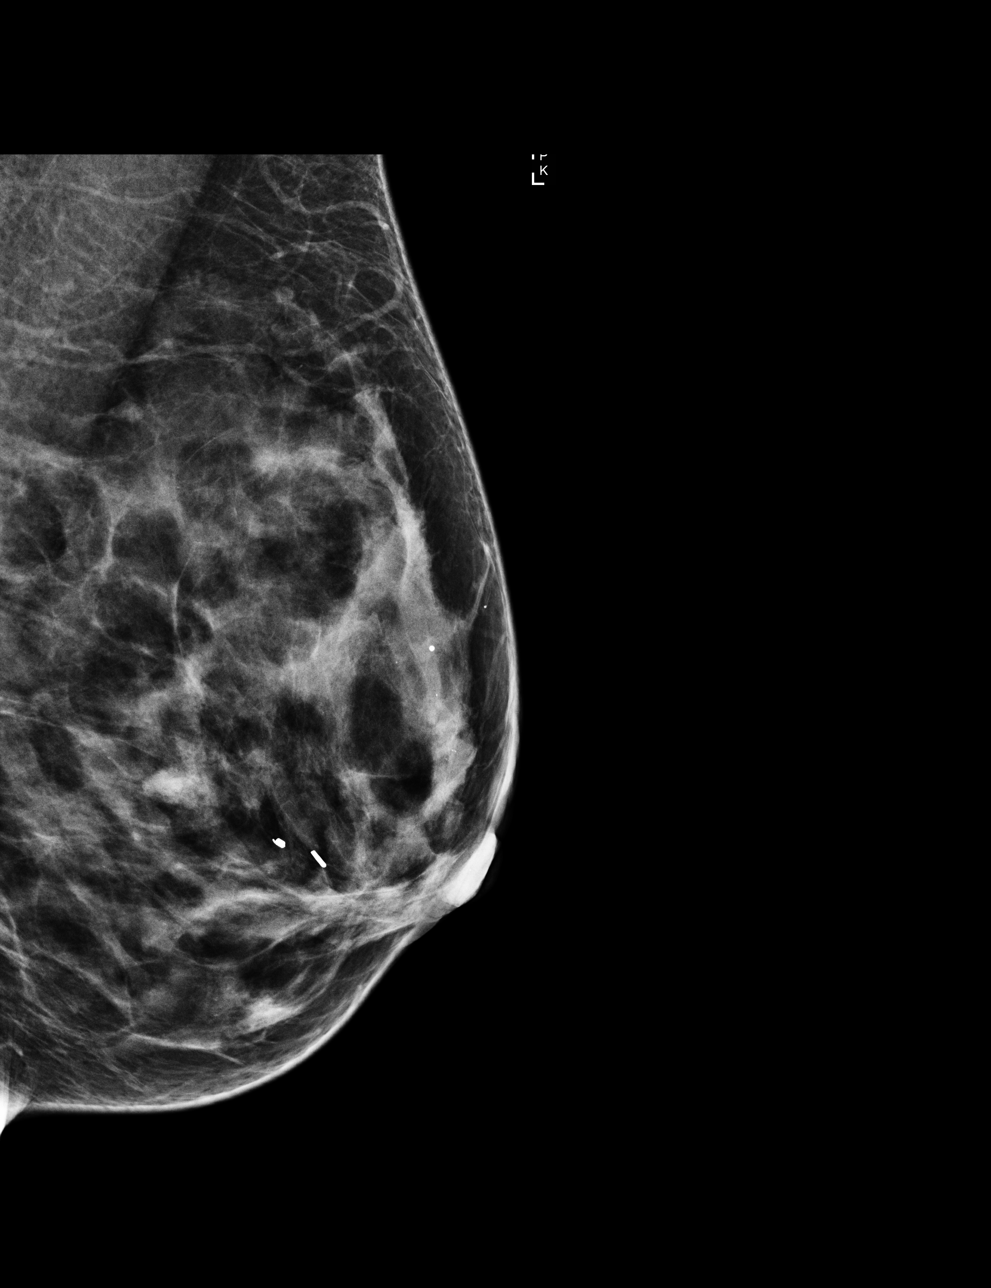

[R CC]
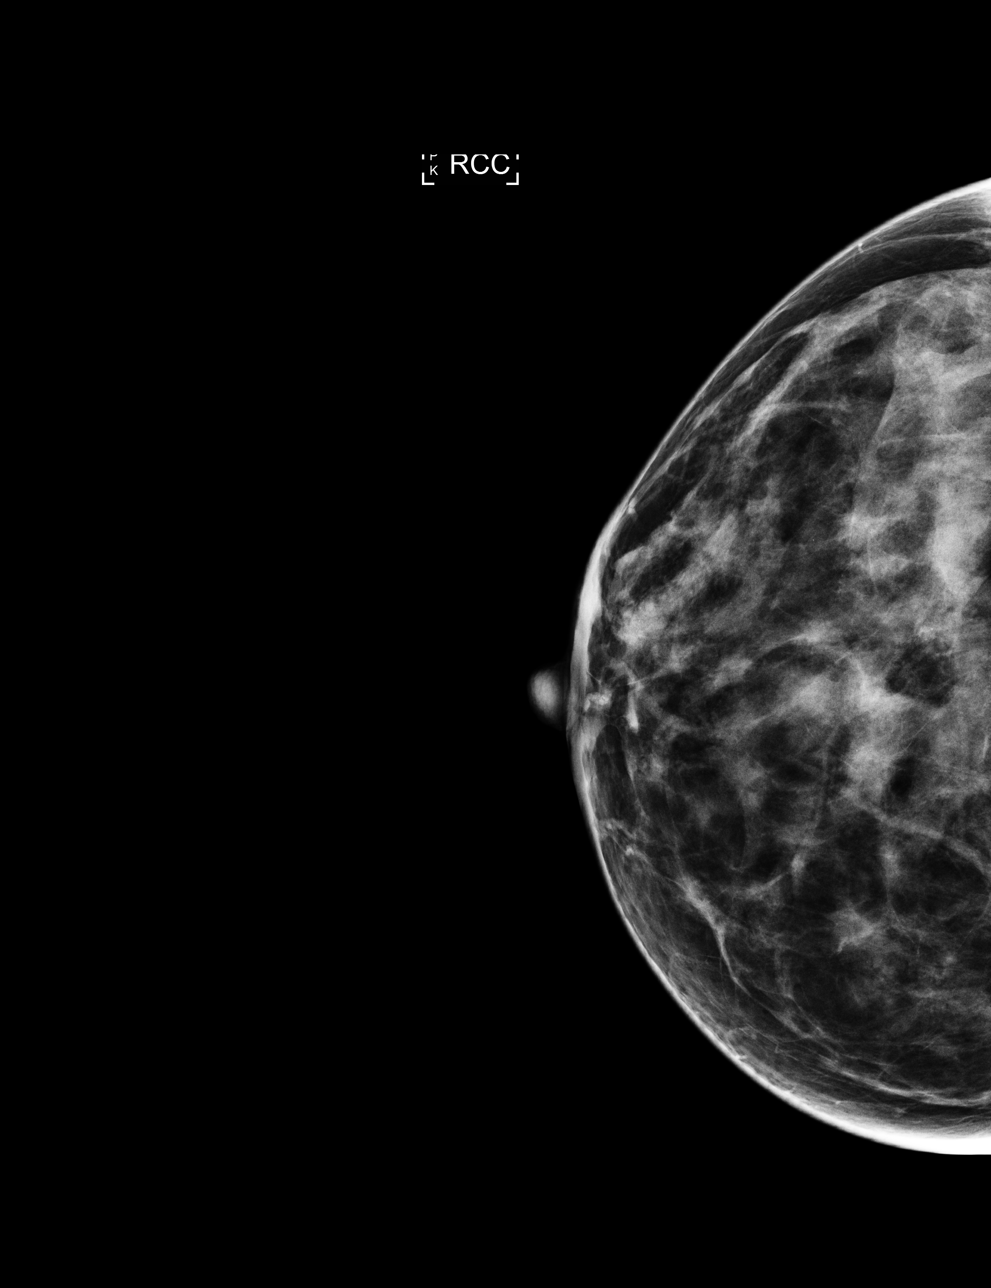

[4 of 4 positions shown; findings below may reference images not displayed]

ACR Breast Density Category c: The breast tissue is heterogeneously
dense, which may obscure small masses
FINDINGS: There are no findings suspicious for malignancy. Images were
processed with CAD.
IMPRESSION: No mammographic evidence of malignancy. A result letter of this
screening mammogram will be mailed directly to the patient.

RECOMMENDATION:
Screening mammogram in one year. (Code:[RG])

BI-RADS CATEGORY  1: Negative.

## 2017-04-19 ENCOUNTER — Other Ambulatory Visit: Payer: Self-pay | Admitting: Internal Medicine

## 2017-04-19 DIAGNOSIS — N6320 Unspecified lump in the left breast, unspecified quadrant: Secondary | ICD-10-CM

## 2017-04-24 ENCOUNTER — Ambulatory Visit
Admission: RE | Admit: 2017-04-24 | Discharge: 2017-04-24 | Disposition: A | Discharge status: Home / Self Care | Payer: BLUE CROSS/BLUE SHIELD | Source: Ambulatory Visit | Attending: Internal Medicine | Admitting: Internal Medicine

## 2017-04-24 DIAGNOSIS — N6320 Unspecified lump in the left breast, unspecified quadrant: Secondary | ICD-10-CM

## 2017-04-24 IMAGING — US ULTRASOUND LEFT BREAST LIMITED
1 series · 2 of 2 positions shown · non-contrast
Comparison: Previous exam(s).

CLINICAL DATA: Palpable lump in the left breast. Two previous
biopsies and surgical excision in the medial left breast near the
site of palpable lump.

EXAM:
2D DIGITAL DIAGNOSTIC BILATERAL MAMMOGRAM WITH CAD AND ADJUNCT TOMO
ULTRASOUND LEFT BREAST

[Series 1: ultrasound left breast limited · 0.07mm/px · 2 of 2 slices shown]
[im 1/2]
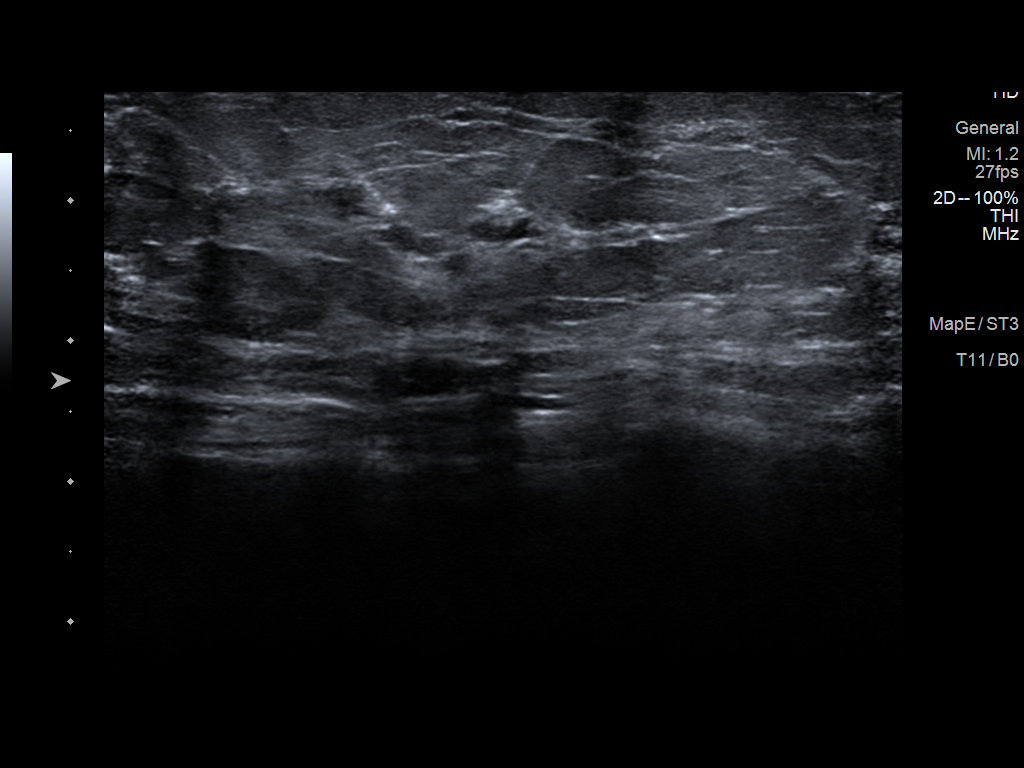
[im 2/2]
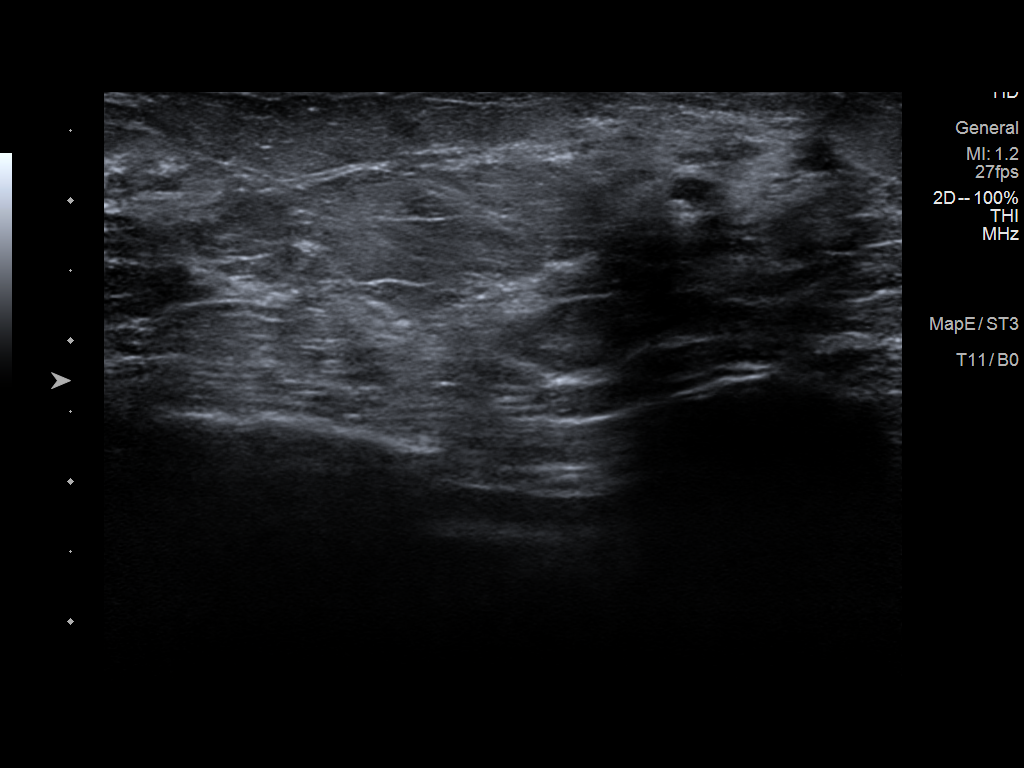

[2 of 2 positions shown; findings below may reference images not displayed]

ACR Breast Density Category c: The breast tissue is heterogeneously
dense, which may obscure small masses.
FINDINGS: There is distortion in medial left breast correlating with previous
surgery. Benign calcifications which are stable are seen in the
medial left breast. A nodular pattern in the medial left breast is
also similar in the interval.

Mammographic images were processed with CAD.

On physical exam, a palpable lump is identified in the region
indicated by the patient.

Targeted ultrasound is performed, showing multiple small cysts
accounting for the palpable lump and mammographic findings.
IMPRESSION: Fibrocystic changes.  No evidence of malignancy.

RECOMMENDATION:
Annual screening mammography.

I have discussed the findings and recommendations with the patient.
Results were also provided in writing at the conclusion of the
visit. If applicable, a reminder letter will be sent to the patient
regarding the next appointment.

BI-RADS CATEGORY  2: Benign.

## 2017-05-13 ENCOUNTER — Ambulatory Visit (HOSPITAL_COMMUNITY): Payer: BLUE CROSS/BLUE SHIELD | Admitting: Psychology

## 2017-06-21 ENCOUNTER — Telehealth: Payer: Self-pay | Admitting: Genetics

## 2017-06-21 ENCOUNTER — Encounter: Payer: Self-pay | Admitting: Genetics

## 2017-06-21 NOTE — Telephone Encounter (Signed)
Pt has been scheduled to Dawn Jackson on 4/23 at 10am. Pt aware to arrive 30 minutes early. Letter mailed.

## 2017-06-24 ENCOUNTER — Telehealth: Payer: Self-pay

## 2017-06-24 NOTE — Telephone Encounter (Signed)
F/u appt requested by Dr. Renne CriglerPharr regarding anticoagulation. Last OV note with long-term plan for anticoagulation therapy faxed to assistant Kennon RoundsSally at [phone] for Dr. Renne CriglerPharr to review and get back in touch with office if f/u with Dr. Candise CheKale still recommended. Phone number provided [phone]. Confirmed fax receipt 06/24/17 at 1408.

## 2017-06-25 ENCOUNTER — Telehealth: Payer: Self-pay

## 2017-06-25 NOTE — Telephone Encounter (Signed)
Recalled patient concerning appointment. No respond back. Per 3/11 in basket. Non urgent appointment.

## 2017-06-25 NOTE — Telephone Encounter (Signed)
Plan received by Kennon RoundsSally, assistant to Dr. [facility]arr at [facility]. Reviewed by Dr. Candise CheKale and labs faxed over from PCP. Spoke with Kennon RoundsSally again to discuss lab work. Per Dr. Renne CriglerPharr, no f/u required at this time. Lab work just informational for Dr. Candise CheKale.   Cancelled appt on 3/14. Called pt to let her know that per conversation between Dr. Renne CriglerPharr and Dr. Candise CheKale based on lab work and plan for anticoagulation no f/u required at this time.

## 2017-06-25 NOTE — Telephone Encounter (Signed)
Called patient with appointment date and time. Asked if this was not convient for her to please call and we will be glad to reschedule. per 3/8 in basket

## 2017-06-27 ENCOUNTER — Ambulatory Visit: Payer: Self-pay | Admitting: Hematology

## 2017-08-09 ENCOUNTER — Other Ambulatory Visit: Payer: Self-pay

## 2017-08-09 ENCOUNTER — Emergency Department (HOSPITAL_COMMUNITY)
Admission: EM | Admit: 2017-08-09 | Discharge: 2017-08-09 | Disposition: A | Discharge status: Home / Self Care | Payer: BLUE CROSS/BLUE SHIELD | Attending: Emergency Medicine | Admitting: Emergency Medicine

## 2017-08-09 DIAGNOSIS — S0101XA Laceration without foreign body of scalp, initial encounter: Secondary | ICD-10-CM | POA: Insufficient documentation

## 2017-08-09 DIAGNOSIS — F17210 Nicotine dependence, cigarettes, uncomplicated: Secondary | ICD-10-CM | POA: Diagnosis not present

## 2017-08-09 DIAGNOSIS — Y999 Unspecified external cause status: Secondary | ICD-10-CM | POA: Insufficient documentation

## 2017-08-09 DIAGNOSIS — Y92012 Bathroom of single-family (private) house as the place of occurrence of the external cause: Secondary | ICD-10-CM | POA: Diagnosis not present

## 2017-08-09 DIAGNOSIS — Z7901 Long term (current) use of anticoagulants: Secondary | ICD-10-CM | POA: Diagnosis not present

## 2017-08-09 DIAGNOSIS — Y93E8 Activity, other personal hygiene: Secondary | ICD-10-CM | POA: Insufficient documentation

## 2017-08-09 DIAGNOSIS — S0003XA Contusion of scalp, initial encounter: Secondary | ICD-10-CM

## 2017-08-09 DIAGNOSIS — Z79899 Other long term (current) drug therapy: Secondary | ICD-10-CM | POA: Diagnosis not present

## 2017-08-09 DIAGNOSIS — W182XXA Fall in (into) shower or empty bathtub, initial encounter: Secondary | ICD-10-CM | POA: Diagnosis not present

## 2017-08-09 MED ORDER — BACITRACIN ZINC 500 UNIT/GM EX OINT
TOPICAL_OINTMENT | Freq: Two times a day (BID) | CUTANEOUS | Status: DC
  Administered 2017-08-09: 1 via TOPICAL

## 2017-08-09 NOTE — ED Provider Notes (Signed)
[facility]-EMERGENCY DEPT Provider Note   CSN: [id number] Arrival date & time: 08/09/17  1507     History   Chief Complaint Chief Complaint  Patient presents with  . Head Laceration    HPI Dawn Jackson is a 47 y.o. female with hx of depression, PTSD and alcohol abuse who presents to the ED for a laceration to the scalp. Patient reports she fell in the shower at 730 this morning and hit the back of her head causing a laceration. Patient denies LOC. She takes an 81 mg aspirin daily.  HPI  Past Medical History:  Diagnosis Date  . Allergy   . Anxiety   . Depression   . PTSD (post-traumatic stress disorder)   . Substance abuse [facility](HCC)     Patient Active Problem List   Diagnosis Date Noted  . 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 Alcohol dependence with uncomplicated withdrawal (HCC)   . Alcohol use disorder, severe, dependence (HCC) 03/17/2015  . PTSD (post-traumatic stress disorder) 02/02/2015  . Severe recurrent major depression without psychotic features (HCC) 02/02/2015    Past Surgical History:  Procedure Laterality Date  . BREAST BIOPSY    . WISDOM TOOTH EXTRACTION       OB History   None      Home Medications    Prior to Admission medications   Medication Sig Start Date End Date Taking? Authorizing Provider  apixaban (ELIQUIS) 5 MG TABS tablet Take 5 mg by mouth 2 (two) times daily.  02/07/16   [provider]  BIOTIN PO Take by mouth.    [provider]  Multiple Vitamin (MULTIVITAMIN WITH MINERALS) TABS tablet Take 1 tablet by mouth daily.    [provider]    Family History Family History  Problem Relation Age of Onset  . Cancer Mother   . Heart disease Father   . COPD Maternal Grandfather   . Cancer Paternal Grandmother   . Breast cancer Paternal Grandmother [phone]    Social History Social History   Tobacco Use  . Smoking status: Current Every Day Smoker    Packs/day: 1.00   Years: 6.00    Pack years: 6.00    Types: Cigarettes  Substance Use Topics  . Alcohol use: Yes    Comment: daily usage 1-2 bottles of wine  . Drug use: Yes    Types: Cocaine     Allergies   Patient has no known allergies.   Review of Systems Review of Systems  Skin: Positive for wound.  All other systems reviewed and are negative.    Physical Exam Updated Vital Signs BP 109/75 (BP Location: Right Arm)   Pulse 70   Temp 98.8 F (37.1 C) (Oral)   Resp 20   Ht 5\' 7"  (1.702 m)   Wt 56.7 kg (125 lb)   SpO2 100%   BMI 19.58 kg/m   Physical Exam  Constitutional: She is oriented to person, place, and time. She appears well-developed and well-nourished. No distress.  HENT:  Head: Head is with contusion and with laceration.    Right Ear: Tympanic membrane normal.  Left Ear: Tympanic membrane normal.  Nose: No epistaxis.  Mouth/Throat: Oropharynx is clear and moist. Normal dentition.  Eyes: Pupils are equal, round, and reactive to light. Conjunctivae and EOM are normal.  Neck: Normal range of motion. Neck supple.  Cardiovascular: Normal rate.  Pulmonary/Chest: Effort normal.  Abdominal: Soft. There is no tenderness.  Musculoskeletal: Normal range of motion.  Neurological: She is alert and oriented to person, place, and time. She has normal strength. No cranial nerve deficit or sensory deficit. She displays a negative Romberg sign. Gait normal.  Reflex Scores:      Bicep reflexes are 2+ on the right side and 2+ on the left side.      Brachioradialis reflexes are 2+ on the right side and 2+ on the left side.      Patellar reflexes are 2+ on the right side and 2+ on the left side. Rapid alternating movements without difficulty.  Skin: Skin is warm and dry.  Psychiatric: She has a normal mood and affect. Her behavior is normal.  Nursing note and vitals reviewed.    ED Treatments / Results  Labs (all labs ordered are listed, but only abnormal results are  displayed) Labs Reviewed - No data to display Radiology No results found.  Procedures .Marland KitchenLaceration Repair Date/Time: 08/09/2017 6:17 PM Performed by: Janne Napoleon, NP Authorized by: Janne Napoleon, NP   Consent:    Consent obtained:  Verbal   Consent given by:  Patient   Risks discussed:  Pain   Alternatives discussed:  No treatment Anesthesia (see MAR for exact dosages):    Anesthesia method:  Topical application (pain ease spray (lidocaine)) Laceration details:    Location:  Scalp   Scalp location:  Crown   Length (cm):  2 Repair type:    Repair type:  Simple Pre-procedure details:    Preparation:  Patient was prepped and draped in usual sterile fashion Exploration:    Hemostasis achieved with:  Direct pressure   Wound exploration: entire depth of wound probed and visualized     Contaminated: no   Treatment:    Area cleansed with:  Saline   Amount of cleaning:  Standard   Irrigation solution:  Sterile saline   Irrigation method:  Syringe Skin repair:    Repair method:  Staples   Number of staples:  2 Approximation:    Approximation:  Close Post-procedure details:    Dressing:  Antibiotic ointment   (including critical care time)  Medications Ordered in ED Medications  bacitracin ointment (1 application Topical Given 08/09/17 1840)     Initial Impression / Assessment and Plan / ED Course  I have reviewed the triage vital signs and the nursing notes. 47 y.o. female with scalp laceration stable for d/c with normal neuro exam. No imaging indicated at this time. I discussed this case with Dr. Ethelda Chick.  Final Clinical Impressions(s) / ED Diagnoses   Final diagnoses:  Laceration of scalp, initial encounter  Contusion of scalp, initial encounter    ED Discharge Orders    None       Kerrie [place], [place] 08/09/17 2020    Doug Sou, MD 08/10/17 [phone]

## 2017-08-09 NOTE — ED Triage Notes (Signed)
Pt fell in the shower 730AM today, hitting the back of her head. Pt has 2 cm scalp laceration. Pt denies loss of consciousness. Pt takes 81 mg aspirin.

## 2017-09-11 ENCOUNTER — Emergency Department (HOSPITAL_COMMUNITY)
Admission: EM | Admit: 2017-09-11 | Discharge: 2017-09-12 | Disposition: A | Discharge status: Other Acute Inpatient Hospital | Payer: Self-pay | Attending: Emergency Medicine | Admitting: Emergency Medicine

## 2017-09-11 DIAGNOSIS — F17210 Nicotine dependence, cigarettes, uncomplicated: Secondary | ICD-10-CM | POA: Insufficient documentation

## 2017-09-11 DIAGNOSIS — F1094 Alcohol use, unspecified with alcohol-induced mood disorder: Secondary | ICD-10-CM | POA: Diagnosis present

## 2017-09-11 DIAGNOSIS — F10920 Alcohol use, unspecified with intoxication, uncomplicated: Secondary | ICD-10-CM | POA: Insufficient documentation

## 2017-09-11 DIAGNOSIS — T50904A Poisoning by unspecified drugs, medicaments and biological substances, undetermined, initial encounter: Secondary | ICD-10-CM | POA: Insufficient documentation

## 2017-09-11 DIAGNOSIS — F1024 Alcohol dependence with alcohol-induced mood disorder: Secondary | ICD-10-CM | POA: Diagnosis present

## 2017-09-11 DIAGNOSIS — Z79899 Other long term (current) drug therapy: Secondary | ICD-10-CM | POA: Insufficient documentation

## 2017-09-11 DIAGNOSIS — F332 Major depressive disorder, recurrent severe without psychotic features: Secondary | ICD-10-CM | POA: Diagnosis present

## 2017-09-11 DIAGNOSIS — F10230 Alcohol dependence with withdrawal, uncomplicated: Secondary | ICD-10-CM

## 2017-09-11 DIAGNOSIS — Y908 Blood alcohol level of 240 mg/100 ml or more: Secondary | ICD-10-CM | POA: Insufficient documentation

## 2017-09-11 LAB — COMPREHENSIVE METABOLIC PANEL
ALT: 58 U/L — ABNORMAL HIGH (ref 14–54)
AST: 52 U/L — ABNORMAL HIGH (ref 15–41)
Albumin: 4.9 g/dL (ref 3.5–5.0)
Alkaline Phosphatase: 55 U/L (ref 38–126)
Anion gap: 12 (ref 5–15)
BUN: 10 mg/dL (ref 6–20)
CO2: 26 mmol/L (ref 22–32)
Calcium: 8.6 mg/dL — ABNORMAL LOW (ref 8.9–10.3)
Chloride: 108 mmol/L (ref 101–111)
Creatinine, Ser: 0.57 mg/dL (ref 0.44–1.00)
GFR calc Af Amer: >60 mL/min (ref >60)
GFR calc non Af Amer: >60 mL/min (ref >60)
Glucose, Bld: 101 mg/dL — ABNORMAL HIGH (ref 65–99)
Potassium: 3.7 mmol/L (ref 3.5–5.1)
Sodium: 146 mmol/L — ABNORMAL HIGH (ref 135–145)
Total Bilirubin: 0.6 mg/dL (ref 0.3–1.2)
Total Protein: 7.2 g/dL (ref 6.5–8.1)

## 2017-09-11 LAB — ETHANOL: Alcohol, Ethyl (B): 379 mg/dL (ref <10)

## 2017-09-11 LAB — CBC
HCT: 40.1 % (ref 36.0–46.0)
Hemoglobin: 13.3 g/dL (ref 12.0–15.0)
MCH: 32.8 pg (ref 26.0–34.0)
MCHC: 33.2 g/dL (ref 30.0–36.0)
MCV: 99 fL (ref 78.0–100.0)
Platelets: 217 10*3/uL (ref 150–400)
RBC: 4.05 MIL/uL (ref 3.87–5.11)
RDW: 15.6 % — ABNORMAL HIGH (ref 11.5–15.5)
WBC: 5 10*3/uL (ref 4.0–10.5)

## 2017-09-11 LAB — RAPID URINE DRUG SCREEN, HOSP PERFORMED
Amphetamines: NOT DETECTED
Barbiturates: NOT DETECTED
Benzodiazepines: NOT DETECTED
Cocaine: NOT DETECTED
Opiates: NOT DETECTED
Tetrahydrocannabinol: NOT DETECTED

## 2017-09-11 LAB — I-STAT BETA HCG BLOOD, ED (MC, WL, AP ONLY): I-stat hCG, quantitative: <5 m[IU]/mL (ref <5)

## 2017-09-11 LAB — SALICYLATE LEVEL: Salicylate Lvl: <7 mg/dL (ref 2.8–30.0)

## 2017-09-11 LAB — ACETAMINOPHEN LEVEL: Acetaminophen (Tylenol), Serum: <10 ug/mL — ABNORMAL LOW (ref 10–30)

## 2017-09-11 MED ORDER — ZIPRASIDONE MESYLATE 20 MG IM SOLR
20.0000 mg | Freq: Once | INTRAMUSCULAR | Status: AC
Start: 2017-09-11 — End: 2017-09-11
  Administered 2017-09-11: 20 mg via INTRAMUSCULAR

## 2017-09-11 MED ORDER — LORAZEPAM 1 MG PO TABS
0.0000 mg | ORAL_TABLET | Freq: Four times a day (QID) | ORAL | Status: DC
  Administered 2017-09-12 (×3): 1 mg via ORAL
  Filled 2017-09-11 (×3): qty 1

## 2017-09-11 MED ORDER — ZIPRASIDONE MESYLATE 20 MG IM SOLR
INTRAMUSCULAR | Status: AC
  Filled 2017-09-11: qty 20

## 2017-09-11 MED ORDER — LORAZEPAM 2 MG/ML IJ SOLN: 0.0000 mg | Freq: Two times a day (BID) | INTRAMUSCULAR | Status: DC

## 2017-09-11 MED ORDER — ACETAMINOPHEN 325 MG PO TABS: 650.0000 mg | ORAL_TABLET | ORAL | Status: DC | PRN

## 2017-09-11 MED ORDER — VITAMIN B-1 100 MG PO TABS
100.0000 mg | ORAL_TABLET | Freq: Every day | ORAL | Status: DC
  Administered 2017-09-11 – 2017-09-12 (×2): 100 mg via ORAL
  Filled 2017-09-11 (×2): qty 1

## 2017-09-11 MED ORDER — LORAZEPAM 2 MG/ML IJ SOLN: 0.0000 mg | Freq: Four times a day (QID) | INTRAMUSCULAR | Status: DC

## 2017-09-11 MED ORDER — SODIUM CHLORIDE 0.9 % IV BOLUS
1000.0000 mL | Freq: Once | INTRAVENOUS | Status: AC
  Administered 2017-09-11: 1000 mL via INTRAVENOUS

## 2017-09-11 MED ORDER — LORAZEPAM 1 MG PO TABS: 0.0000 mg | ORAL_TABLET | Freq: Two times a day (BID) | ORAL | Status: DC

## 2017-09-11 MED ORDER — THIAMINE HCL 100 MG/ML IJ SOLN: 100.0000 mg | Freq: Every day | INTRAMUSCULAR | Status: DC

## 2017-09-11 NOTE — ED Notes (Signed)
Pt was "vomitting" to cover when she pulls here hair out by the root.

## 2017-09-11 NOTE — ED Notes (Signed)
While completing patient assessment, patient states that her sister gave her 2 Trazodone pills and she does confess to drinking two bottles of wine. Patient actively denying any suicidal thoughts, and has no reported symptoms at this time. Will continue to monitor. Patient has been dressed out into scrubs. Belongings include: one watch, one necklace, white t-shirt, blue jeans, bra, and sneakers/socks.  Patient could not remove her wedding ring, therefore it remains with patient.
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
## 2017-09-11 NOTE — ED Triage Notes (Signed)
Transported by [facility] from home--significant other called EMS reporting that she had taken  (an unknown amount of) Trazodone and had consumed 2 bottles of wine. Called was placed at 1335. Hx of SI, presents to ED voluntary at this time. Sister informed PD that she would be taking out IVC paperwork on patient. Prescription bottle brought to ED--patient was prescribed 60 pills on 07/11/17 and only 28 pills remain.
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
## 2017-09-11 NOTE — ED Provider Notes (Signed)
[facility]-EMERGENCY DEPT Provider Note   CSN: [id number] Arrival date & time: 09/11/17  1421     History   Chief Complaint Chief Complaint  Patient presents with  . Drug Overdose    HPI Dawn Jackson is a 47 y.o. female.  HPI Patient is a 47 year old female with history of alcohol abuse, anxiety/depression who presents after drug overdose. Per EMS, patient drank 2 bottles of wine today and took a handful of trazodone pills. She denies this being a suicide attempt. Patient currently denies any suicidal ideation. She is having homicidal ideation towards her daughters father. Patient's sister presented with IVC paperwork.  Past Medical History:  Diagnosis Date  . Allergy   . Anxiety   . Depression   . PTSD (post-traumatic stress disorder)   . Substance abuse [facility])     Patient Active Problem List   Diagnosis Date Noted  . 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 Alcohol dependence with uncomplicated withdrawal (HCC)   . Alcohol use disorder, severe, dependence (HCC) 03/17/2015  . PTSD (post-traumatic stress disorder) 02/02/2015  . Severe recurrent major depression without psychotic features (HCC) 02/02/2015    Past Surgical History:  Procedure Laterality Date  . BREAST BIOPSY    . WISDOM TOOTH EXTRACTION       OB History   None      Home Medications    Prior to Admission medications   Medication Sig Start Date End Date Taking? Authorizing Provider  DULoxetine (CYMBALTA) 60 MG capsule Take 60 mg by mouth daily. 07/11/17  Yes [provider]  traZODone (DESYREL) 50 MG tablet Take 50-100 mg by mouth at bedtime as needed. 07/11/17  Yes [provider]    Family History Family History  Problem Relation Age of Onset  . Cancer Mother   . Heart disease Father   . COPD Maternal Grandfather   . Cancer Paternal Grandmother   . Breast cancer Paternal Grandmother 33    Social History Social History   Tobacco Use    . Smoking status: Current Every Day Smoker    Packs/day: 1.00    Years: 6.00    Pack years: 6.00    Types: Cigarettes  Substance Use Topics  . Alcohol use: Yes    Comment: daily usage 1-2 bottles of wine  . Drug use: Yes    Types: Cocaine     Allergies   Patient has no known allergies.   Review of Systems Review of Systems  Constitutional: Negative for chills and fever.  HENT: Negative for ear pain and sore throat.   Eyes: Negative for pain and visual disturbance.  Respiratory: Negative for cough and shortness of breath.   Cardiovascular: Negative for chest pain and palpitations.  Gastrointestinal: Negative for abdominal pain and vomiting.  Genitourinary: Negative for dysuria and hematuria.  Musculoskeletal: Negative for arthralgias and back pain.  Skin: Negative for color change and rash.  Neurological: Negative for seizures and syncope.  Psychiatric/Behavioral: Positive for agitation. Negative for suicidal ideas.  All other systems reviewed and are negative.    Physical Exam Updated Vital Signs BP 121/79 (BP Location: Left Arm)   Pulse 94   Temp 98.4 F (36.9 C) (Oral)   Resp 18   SpO2 97%   Physical Exam  Constitutional: She appears well-developed and well-nourished. No distress.  HENT:  Head: Normocephalic and atraumatic.  Eyes: Conjunctivae are normal.  Neck: Neck supple.  Cardiovascular: Normal rate and regular rhythm.  No  murmur heard. Pulmonary/Chest: Effort normal and breath sounds normal. No respiratory distress.  Abdominal: Soft. There is no tenderness.  Musculoskeletal: She exhibits no edema.  Neurological: She is alert.  Skin: Skin is warm and dry.  Psychiatric: Her speech is delayed. She is agitated. Cognition and memory are impaired. She expresses impulsivity. She exhibits a depressed mood. She expresses homicidal ideation. She expresses no suicidal ideation. She expresses no suicidal plans and no homicidal plans. She is inattentive.  Nursing  note and vitals reviewed.    ED Treatments / Results  Labs (all labs ordered are listed, but only abnormal results are displayed) Labs Reviewed  COMPREHENSIVE METABOLIC PANEL - Abnormal; Notable for the following components:      Result Value   Sodium 146 (*)    Glucose, Bld 101 (*)    Calcium 8.6 (*)    AST 52 (*)    ALT 58 (*)    All other components within normal limits  ETHANOL - Abnormal; Notable for the following components:   Alcohol, Ethyl (B) 379 (*)    All other components within normal limits  ACETAMINOPHEN LEVEL - Abnormal; Notable for the following components:   Acetaminophen (Tylenol), Serum <10 (*)    All other components within normal limits  CBC - Abnormal; Notable for the following components:   RDW 15.6 (*)    All other components within normal limits  SALICYLATE LEVEL  RAPID URINE DRUG SCREEN, HOSP PERFORMED  I-STAT BETA HCG BLOOD, ED ([facility], [facility], [facility] ONLY)    EKG None  Radiology No results found.  Procedures Procedures (including critical care time)  Medications Ordered in ED Medications  LORazepam (ATIVAN) injection 0-4 mg (0 mg Intravenous Not Given 09/11/17 1940)    Or  LORazepam (ATIVAN) tablet 0-4 mg ( Oral See Alternative 09/11/17 1940)  LORazepam (ATIVAN) injection 0-4 mg (has no administration in time range)    Or  LORazepam (ATIVAN) tablet 0-4 mg (has no administration in time range)  thiamine (VITAMIN B-1) tablet 100 mg (has no administration in time range)    Or  thiamine (B-1) injection 100 mg (has no administration in time range)  acetaminophen (TYLENOL) tablet 650 mg (has no administration in time range)  ziprasidone (GEODON) injection 20 mg (20 mg Intramuscular See Procedure Record 09/11/17 1611)  sodium chloride 0.9 % bolus 1,000 mL (0 mLs Intravenous Stopped 09/11/17 1752)     Initial Impression / Assessment and Plan / ED Course  I have reviewed the triage vital signs and the nursing notes.  Pertinent labs & imaging results that  were available during my care of the patient were reviewed by me and considered in my medical decision making (see chart for details).     Patient is a 47 year old female with history of alcohol abuse, anxiety/depression who presents after drug overdose. Reported drinking 2000 wine and unknown quantity of trazodone. Patient arrived hemodynamically stable, intoxicated. Exam as above. Patient denying any suicidal ideation this time, however expressing homicidal ideation.  Discussed with poison control, and for labs and to monitor on telemetry for 4 hours. No EKG changes noted. While awaiting metabolization, patient became increasingly agitated, and required chemical restraints. On reevaluation, clinically sober. Medically cleared. Consult to psychiatry.  Pending reassessment in the a.m. Please see ongoing provider note for outpatient disposition.  Final Clinical Impressions(s) / ED Diagnoses   Final diagnoses:  Drug overdose, undetermined intent, initial encounter  Alcoholic intoxication without complication [facility])    ED Discharge Orders  None       Wynelle Cleveland, MD 09/12/17 Pernell Dupre    Gerhard Munch, MD 09/12/17 [phone]

## 2017-09-11 NOTE — ED Notes (Signed)
Bed: RESB Expected date:  Expected time:  Means of arrival:  Comments: EMS SI / ? overdose 

## 2017-09-11 NOTE — ED Notes (Signed)
Spoke with Thyra Breed from [employer] who recommended the following:  Cardiac monitoring and EKG for at least 4 hours Hydration  Monitor for QTC prolongation, hypotension, bradycardia  Check electrolytes

## 2017-09-12 ENCOUNTER — Other Ambulatory Visit: Payer: Self-pay

## 2017-09-12 ENCOUNTER — Inpatient Hospital Stay (HOSPITAL_COMMUNITY): Admission: AD | Admit: 2017-09-12 | Payer: Self-pay | Source: Intra-hospital | Admitting: Psychiatry

## 2017-09-12 ENCOUNTER — Encounter (HOSPITAL_COMMUNITY): Payer: Self-pay | Admitting: *Deleted

## 2017-09-12 ENCOUNTER — Inpatient Hospital Stay (HOSPITAL_COMMUNITY)
Admission: AD | Admit: 2017-09-12 | Discharge: 2017-09-15 | DRG: 885 | Disposition: A | Discharge status: Home / Self Care | Payer: No Typology Code available for payment source | Source: Intra-hospital | Attending: Psychiatry | Admitting: Psychiatry

## 2017-09-12 DIAGNOSIS — T1491XA Suicide attempt, initial encounter: Secondary | ICD-10-CM

## 2017-09-12 DIAGNOSIS — F1024 Alcohol dependence with alcohol-induced mood disorder: Secondary | ICD-10-CM

## 2017-09-12 DIAGNOSIS — F411 Generalized anxiety disorder: Secondary | ICD-10-CM | POA: Diagnosis present

## 2017-09-12 DIAGNOSIS — F4310 Post-traumatic stress disorder, unspecified: Secondary | ICD-10-CM | POA: Diagnosis present

## 2017-09-12 DIAGNOSIS — T43212A Poisoning by selective serotonin and norepinephrine reuptake inhibitors, intentional self-harm, initial encounter: Secondary | ICD-10-CM

## 2017-09-12 DIAGNOSIS — F332 Major depressive disorder, recurrent severe without psychotic features: Secondary | ICD-10-CM | POA: Diagnosis present

## 2017-09-12 DIAGNOSIS — Z79899 Other long term (current) drug therapy: Secondary | ICD-10-CM | POA: Diagnosis not present

## 2017-09-12 DIAGNOSIS — F10230 Alcohol dependence with withdrawal, uncomplicated: Secondary | ICD-10-CM | POA: Diagnosis present

## 2017-09-12 DIAGNOSIS — G4700 Insomnia, unspecified: Secondary | ICD-10-CM | POA: Diagnosis present

## 2017-09-12 DIAGNOSIS — F1410 Cocaine abuse, uncomplicated: Secondary | ICD-10-CM | POA: Diagnosis present

## 2017-09-12 DIAGNOSIS — Y908 Blood alcohol level of 240 mg/100 ml or more: Secondary | ICD-10-CM | POA: Diagnosis present

## 2017-09-12 DIAGNOSIS — F17210 Nicotine dependence, cigarettes, uncomplicated: Secondary | ICD-10-CM

## 2017-09-12 DIAGNOSIS — F419 Anxiety disorder, unspecified: Secondary | ICD-10-CM

## 2017-09-12 MED ORDER — VITAMIN B-1 100 MG PO TABS
100.0000 mg | ORAL_TABLET | Freq: Every day | ORAL | Status: DC
  Administered 2017-09-13 – 2017-09-15 (×3): 100 mg via ORAL
  Filled 2017-09-12 (×4): qty 1

## 2017-09-12 MED ORDER — LORAZEPAM 1 MG PO TABS: 1.0000 mg | ORAL_TABLET | Freq: Two times a day (BID) | ORAL | Status: DC

## 2017-09-12 MED ORDER — ALUM & MAG HYDROXIDE-SIMETH 200-200-20 MG/5ML PO SUSP: 30.0000 mL | ORAL | Status: DC | PRN

## 2017-09-12 MED ORDER — MAGNESIUM HYDROXIDE 400 MG/5ML PO SUSP: 30.0000 mL | Freq: Every day | ORAL | Status: DC | PRN

## 2017-09-12 MED ORDER — LORAZEPAM 1 MG PO TABS
1.0000 mg | ORAL_TABLET | Freq: Four times a day (QID) | ORAL | Status: AC
  Administered 2017-09-12 – 2017-09-13 (×6): 1 mg via ORAL
  Filled 2017-09-12 (×6): qty 1

## 2017-09-12 MED ORDER — LOPERAMIDE HCL 2 MG PO CAPS: 2.0000 mg | ORAL_CAPSULE | ORAL | Status: DC | PRN

## 2017-09-12 MED ORDER — LORAZEPAM 1 MG PO TABS: 0.0000 mg | ORAL_TABLET | Freq: Four times a day (QID) | ORAL | Status: DC

## 2017-09-12 MED ORDER — LORAZEPAM 2 MG/ML IJ SOLN: 0.0000 mg | Freq: Four times a day (QID) | INTRAMUSCULAR | Status: DC

## 2017-09-12 MED ORDER — ONDANSETRON 4 MG PO TBDP
4.0000 mg | ORAL_TABLET | Freq: Four times a day (QID) | ORAL | Status: DC | PRN
  Administered 2017-09-13: 4 mg via ORAL
  Filled 2017-09-12: qty 1

## 2017-09-12 MED ORDER — ADULT MULTIVITAMIN W/MINERALS CH
1.0000 | ORAL_TABLET | Freq: Every day | ORAL | Status: DC
  Administered 2017-09-12 – 2017-09-15 (×4): 1 via ORAL
  Filled 2017-09-12 (×6): qty 1

## 2017-09-12 MED ORDER — DULOXETINE HCL 60 MG PO CPEP
60.0000 mg | ORAL_CAPSULE | Freq: Every day | ORAL | Status: DC
  Administered 2017-09-12 – 2017-09-15 (×4): 60 mg via ORAL
  Filled 2017-09-12 (×6): qty 1

## 2017-09-12 MED ORDER — VITAMIN B-1 100 MG PO TABS: 100.0000 mg | ORAL_TABLET | Freq: Every day | ORAL | Status: DC

## 2017-09-12 MED ORDER — LORAZEPAM 1 MG PO TABS: 1.0000 mg | ORAL_TABLET | Freq: Every day | ORAL | Status: DC

## 2017-09-12 MED ORDER — LORAZEPAM 1 MG PO TABS: 0.0000 mg | ORAL_TABLET | Freq: Two times a day (BID) | ORAL | Status: DC

## 2017-09-12 MED ORDER — LORAZEPAM 2 MG/ML IJ SOLN: 0.0000 mg | Freq: Two times a day (BID) | INTRAMUSCULAR | Status: DC

## 2017-09-12 MED ORDER — TRAZODONE HCL 100 MG PO TABS
100.0000 mg | ORAL_TABLET | Freq: Every evening | ORAL | Status: DC | PRN
  Filled 2017-09-12: qty 1

## 2017-09-12 MED ORDER — LORAZEPAM 1 MG PO TABS
1.0000 mg | ORAL_TABLET | Freq: Four times a day (QID) | ORAL | Status: DC | PRN
  Administered 2017-09-14: 1 mg via ORAL
  Filled 2017-09-12: qty 1

## 2017-09-12 MED ORDER — HYDROXYZINE HCL 25 MG PO TABS
25.0000 mg | ORAL_TABLET | Freq: Four times a day (QID) | ORAL | Status: DC | PRN
  Administered 2017-09-13: 25 mg via ORAL
  Filled 2017-09-12: qty 1

## 2017-09-12 MED ORDER — THIAMINE HCL 100 MG/ML IJ SOLN: 100.0000 mg | Freq: Every day | INTRAMUSCULAR | Status: DC

## 2017-09-12 MED ORDER — NICOTINE POLACRILEX 2 MG MT GUM
2.0000 mg | CHEWING_GUM | OROMUCOSAL | Status: DC | PRN
  Administered 2017-09-14 – 2017-09-15 (×4): 2 mg via ORAL

## 2017-09-12 MED ORDER — LORAZEPAM 1 MG PO TABS
1.0000 mg | ORAL_TABLET | Freq: Three times a day (TID) | ORAL | Status: DC
  Administered 2017-09-14 (×2): 1 mg via ORAL
  Filled 2017-09-12 (×2): qty 1

## 2017-09-12 NOTE — ED Notes (Signed)
Pt

## 2017-09-12 NOTE — ED Notes (Signed)
Per order, pt was discharged for transfer to [facility]. Pt left unit under IVC in care of [employer], who had pt's belongings. Pt was ambulatory and in no acute distress. She had verbalized readiness for discharge.

## 2017-09-12 NOTE — ED Notes (Signed)
[employer] called for transport 

## 2017-09-12 NOTE — ED Notes (Signed)
Poison Control closing case

## 2017-09-12 NOTE — BH Assessment (Signed)
[facility] Assessment Progress Note  Per Juanetta Beets, DO, this pt requires psychiatric hospitalization.  Berneice Heinrich, RN, [facility] has assigned pt to [facility] Rm 305-2; [facility] will be ready to receive pt at 13:45.  Pt presents under IVC initiated by pt's sister, and upheld by Dr Sharma Covert, and IVC documents have been faxed to [facility].  Pt's nurse, Clydie Braun, has been notified, and agrees to call report to [phone].  Pt is to be transported via [employer].   Doylene Canning, [place] Behavioral Health Coordinator [phone]

## 2017-09-12 NOTE — Progress Notes (Signed)
Unable to do CIWA/ BP pt resting in bed with eyes closed. RN will continue to monitor
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
## 2017-09-12 NOTE — ED Notes (Signed)
Report called to Careers adviser at [facility].

## 2017-09-12 NOTE — ED Notes (Signed)
Pt was oriented to unit. She denied SI, HI, and AVH. Pt endorses tremors, headache, and nervousness. She ate breakfast. Pt was cooperative. Pt made aware of camera monitoring and location of bathrooms/phone/nurses' station.

## 2017-09-12 NOTE — Plan of Care (Signed)
Pt continues to progress towards goals and d/c. RN will continue to monitor.  

## 2017-09-12 NOTE — Progress Notes (Signed)
Pt invited, but declined wrap-up group.
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
## 2017-09-13 DIAGNOSIS — F419 Anxiety disorder, unspecified: Secondary | ICD-10-CM

## 2017-09-13 DIAGNOSIS — F332 Major depressive disorder, recurrent severe without psychotic features: Principal | ICD-10-CM

## 2017-09-13 DIAGNOSIS — Z9149 Other personal history of psychological trauma, not elsewhere classified: Secondary | ICD-10-CM

## 2017-09-13 DIAGNOSIS — F17210 Nicotine dependence, cigarettes, uncomplicated: Secondary | ICD-10-CM

## 2017-09-13 DIAGNOSIS — Z811 Family history of alcohol abuse and dependence: Secondary | ICD-10-CM

## 2017-09-13 DIAGNOSIS — Y908 Blood alcohol level of 240 mg/100 ml or more: Secondary | ICD-10-CM

## 2017-09-13 DIAGNOSIS — F1024 Alcohol dependence with alcohol-induced mood disorder: Secondary | ICD-10-CM

## 2017-09-13 MED ORDER — TRAZODONE HCL 50 MG PO TABS
50.0000 mg | ORAL_TABLET | ORAL | Status: AC
  Administered 2017-09-13: 50 mg via ORAL
  Filled 2017-09-13 (×2): qty 1

## 2017-09-13 MED ORDER — TRAZODONE HCL 100 MG PO TABS
100.0000 mg | ORAL_TABLET | Freq: Every evening | ORAL | Status: DC | PRN
  Filled 2017-09-13 (×4): qty 1

## 2017-09-13 MED ORDER — TRAZODONE HCL 50 MG PO TABS
50.0000 mg | ORAL_TABLET | Freq: Every evening | ORAL | Status: DC | PRN
  Administered 2017-09-13: 50 mg via ORAL
  Filled 2017-09-13: qty 1
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
## 2017-09-13 NOTE — Progress Notes (Signed)
Recreation Therapy Notes  Date: 5.31.19 Time: 0930 Location: [address] Dayroom  Group Topic: Stress Management  Goal Area(s) Addresses:  Patient will verbalize importance of using healthy stress management.  Patient will identify positive emotions associated with healthy stress management.   Intervention: Stress Management  Activity :  Progressive Muscle Relaxation.  LRT lead group in progressive muscle relaxation.  Patients were to tense each muscle one at a time and then release the tension.  Patients were to follow along as LRT lead them through the activity.    Education:  Stress Management, Discharge Planning.   Education Outcome: Acknowledges edcuation/In group clarification offered/Needs additional education  Clinical Observations/Feedback:  Pt did not attend group.      Caroll RancherMarjette Lorisa Scheid, LRT/CTRS         Caroll RancherLindsay, Cristo Ausburn A 09/13/2017 11:29 AM

## 2017-09-13 NOTE — Plan of Care (Signed)
  Problem: Education: Goal: Emotional status will improve Outcome: Progressing Goal: Mental status will improve Outcome: Progressing Goal: Verbalization of understanding the information provided will improve Outcome: Progressing   Problem: Activity: Goal: Interest or engagement in activities will improve Outcome: Progressing Goal: Sleeping patterns will improve Outcome: Progressing   Problem: Health Behavior/Discharge Planning: Goal: Compliance with treatment plan for underlying cause of condition will improve Outcome: Progressing   

## 2017-09-13 NOTE — Tx Team (Signed)
Interdisciplinary Treatment and Diagnostic Plan Update  09/13/2017 Time of Session: [id number] ALEYDA Jackson MRN: [id number]  Principal Diagnosis: MDD, recurrent, severe, without psychotic features  Secondary Diagnoses: Active Problems:   Major depressive disorder, recurrent severe without psychotic features ([place])   Current Medications:  Current Facility-Administered Medications  Medication Dose Route Frequency Provider Last Rate Last Dose  . alum & mag hydroxide-simeth (MAALOX/MYLANTA) 200-200-20 MG/5ML suspension 30 mL  30 mL Oral Q4H PRN [name], NP      . DULoxetine (CYMBALTA) DR capsule 60 mg  60 mg Oral Daily [name], NP   60 mg at 09/13/17 0759  . hydrOXYzine (ATARAX/VISTARIL) tablet 25 mg  25 mg Oral Q6H PRN Money, [name], FNP   25 mg at 09/13/17 0616  . loperamide (IMODIUM) capsule 2-4 mg  2-4 mg Oral PRN Money, [name], FNP      . LORazepam (ATIVAN) tablet 1 mg  1 mg Oral Q6H PRN Money, [name], FNP      . LORazepam (ATIVAN) tablet 1 mg  1 mg Oral QID Money, [name], FNP   1 mg at 09/13/17 0759   Followed by  . [START ON 09/14/2017] LORazepam (ATIVAN) tablet 1 mg  1 mg Oral TID Money, [name], FNP       Followed by  . [START ON 09/15/2017] LORazepam (ATIVAN) tablet 1 mg  1 mg Oral BID Money, [name], FNP       Followed by  . [START ON 09/16/2017] LORazepam (ATIVAN) tablet 1 mg  1 mg Oral Daily Money, [name], FNP      . magnesium hydroxide (MILK OF MAGNESIA) suspension 30 mL  30 mL Oral Daily PRN [name], NP      . multivitamin with minerals tablet 1 tablet  1 tablet Oral Daily Money, [name], FNP   1 tablet at 09/13/17 0759  . nicotine polacrilex (NICORETTE) gum 2 mg  2 mg Oral PRN Cobos, Myer Peer, MD      . ondansetron (ZOFRAN-ODT) disintegrating tablet 4 mg  4 mg Oral Q6H PRN Money, [name], FNP   4 mg at 09/13/17 0616  . thiamine (VITAMIN B-1) tablet 100 mg  100 mg Oral Daily [name], NP   100 mg at 09/13/17 0757  . traZODone (DESYREL)  tablet 100 mg  100 mg Oral QHS PRN [name], NP       PTA Medications: Medications Prior to Admission  Medication Sig Dispense Refill Last Dose  . DULoxetine (CYMBALTA) 60 MG capsule Take 60 mg by mouth daily.  1 Last dispensed 07/11/17  . traZODone (DESYREL) 50 MG tablet Take 50-100 mg by mouth at bedtime as needed.  1 Last dispensed 07/11/17    Patient Stressors: Health problems Marital or family conflict  Patient Strengths: Motivation for treatment/growth Special hobby/interest Work skills  Treatment Modalities: Medication Management, Group therapy, Case management,  1 to 1 session with clinician, Psychoeducation, Recreational therapy.   Physician Treatment Plan for Primary Diagnosis:  MDD, recurrent, severe, without psychotic features  Medication Management: Evaluate patient's response, side effects, and tolerance of medication regimen.  Therapeutic Interventions: 1 to 1 sessions, Unit Group sessions and Medication administration.  Evaluation of Outcomes: Not Met  Physician Treatment Plan for Secondary Diagnosis: Active Problems:   Major depressive disorder, recurrent severe without psychotic features ([place])   Medication Management: Evaluate patient's response, side effects, and tolerance of medication regimen.  Therapeutic Interventions: 1 to 1  sessions, Unit Group sessions and Medication administration.  Evaluation of Outcomes: Not Met   RN Treatment Plan for Primary Diagnosis:  MDD, recurrent, severe, without psychotic features Long Term Goal(s): Knowledge of disease and therapeutic regimen to maintain health will improve  Short Term Goals: Ability to remain free from injury will improve, Ability to demonstrate self-control and Ability to disclose and discuss suicidal ideas  Medication Management: RN will administer medications as ordered by provider, will assess and evaluate patient's response and provide education to patient for prescribed medication. RN will  report any adverse and/or side effects to prescribing provider.  Therapeutic Interventions: 1 on 1 counseling sessions, Psychoeducation, Medication administration, Evaluate responses to treatment, Monitor vital signs and CBGs as ordered, Perform/monitor CIWA, COWS, AIMS and Fall Risk screenings as ordered, Perform wound care treatments as ordered.  Evaluation of Outcomes: Not Met   LCSW Treatment Plan for Primary Diagnosis:  MDD, recurrent, severe, without psychotic features Long Term Goal(s): Safe transition to appropriate next level of care at discharge, Engage patient in therapeutic group addressing interpersonal concerns.  Short Term Goals: Engage patient in aftercare planning with referrals and resources, Facilitate patient progression through stages of change regarding substance use diagnoses and concerns and Identify triggers associated with mental health/substance abuse issues  Therapeutic Interventions: Assess for all discharge needs, 1 to 1 time with Social worker, Explore available resources and support systems, Assess for adequacy in community support network, Educate family and significant other(s) on suicide prevention, Complete Psychosocial Assessment, Interpersonal group therapy.  Evaluation of Outcomes: Not Met   Progress in Treatment: Attending groups: No. New to unit. Continuing to assess.  Participating in groups: No. Taking medication as prescribed: Yes. Toleration medication: Yes. Family/Significant other contact made: No, will contact:  family member if pt consents to collateral contact.  Patient understands diagnosis: Yes. Discussing patient identified problems/goals with staff: Yes. Medical problems stabilized or resolved: Yes. Denies suicidal/homicidal ideation: Yes. Issues/concerns per patient self-inventory: No. Other: n/a   New problem(s) identified: No, Describe:  n/a  New Short Term/Long Term Goal(s): detox, medication management for mood stabilization;  elimination of SI thoughts; development of comprehensive mental wellness/sobriety plan.   Patient Goals:  "to get detoxed from alcohol and go back to AA."  Discharge Plan or Barriers: CSW assessing for appropriate referrals. Recent history at [facility] for substance abuse classes. [facility] pamphlet, Mobile Crisis information, and AA/NA information provided to patient for additional community support and resources.   Reason for Continuation of Hospitalization: Anxiety Depression Medication stabilization Suicidal ideation Withdrawal symptoms  Estimated Length of Stay: Tuesday, 09/17/17  Attendees: Patient: Dawn Jackson 09/13/2017 9:01 AM  Physician: Dr. Parke Poisson MD; Dr. Nancy Fetter MD 09/13/2017 9:01 AM  Nursing: Opal Sidles RN; Chrys Racer RN 09/13/2017 9:01 AM  RN Care Manager:x 09/13/2017 9:01 AM  Social Worker: Janice Norrie LCSW 09/13/2017 9:01 AM  Recreational Therapist: x 09/13/2017 9:01 AM  Other: Lindell Spar NP; Darnelle Maffucci Money NP 09/13/2017 9:01 AM  Other:  09/13/2017 9:01 AM  Other: 09/13/2017 9:01 AM    Scribe for Treatment Team: Avelina Laine, LCSW 09/13/2017 9:01 AM

## 2017-09-13 NOTE — Tx Team (Signed)
Initial Treatment Plan 09/13/2017 3:24 AM Dawn Jackson UEA:[id number]    PATIENT STRESSORS: Health problems Marital or family conflict   PATIENT STRENGTHS: Motivation for treatment/growth Special hobby/interest Work skills   PATIENT IDENTIFIED PROBLEMS: "I have felt hopeless"  Alcohol abuse   Anxiety/depression                 DISCHARGE CRITERIA:  Ability to meet basic life and health needs Reduction of life-threatening or endangering symptoms to within safe limits Withdrawal symptoms are absent or subacute and managed without 24-hour nursing intervention  PRELIMINARY DISCHARGE PLAN: Attend PHP/IOP Attend 12-step recovery group Return to previous work or school arrangements  PATIENT/FAMILY INVOLVEMENT: This treatment plan has been presented to and reviewed with the patient, Dawn Jackson,  The patient and family have been given the opportunity to ask questions and make suggestions.  Dawn LabShaneka E [name], RN 09/13/2017, 3:24 AM

## 2017-09-13 NOTE — Progress Notes (Signed)
Staff asked pt time for wrap up group. Pt. Said she do not want to go go wrap up group. Pt sitting on her bed reading a book and would not respond at first when staff asked if she going to wrap up group.

## 2017-09-13 NOTE — Progress Notes (Signed)
D: Patient states she does not feel well today.  She is tremulous, disheveled and lethargic.  She has remained in bed the majority of the day, however, she is going to meals.  Patient has been drinking fluids and is tolerating the ativan protocol well.  Patient has been drinking heavily for the past two weeks after relapsing after 4 months of sobriety.  Patient denies any thoughts of self harm.    A: Continue to monitor medication management and MD orders.  Safety checks completed every 15 minutes per protocol.  Offer support and encouragement as needed.  R: Patient is isolative to her room.
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
## 2017-09-13 NOTE — BHH Group Notes (Signed)
BHH Group Notes:  (Nursing/MHT/Case Management/Adjunct)  Date:  09/13/2017  Time:  5:29 PM  Type of Therapy:  Psychoeducational Skills  Participation Level:  Active  Participation Quality:  Appropriate  Affect:  Appropriate  Cognitive:  Appropriate  Insight:  Appropriate  Engagement in Group:  Engaged  Modes of Intervention:  Problem-solving  Summary of Progress/Problems: Pt played bingo with peers.    Bethann PunchesJane O Makayela Secrest 09/13/2017, 5:29 PM
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
## 2017-09-13 NOTE — Plan of Care (Signed)
Pt continues to progress towards goals and d/c. RN will continue to monitor.  

## 2017-09-14 DIAGNOSIS — G4700 Insomnia, unspecified: Secondary | ICD-10-CM

## 2017-09-14 MED ORDER — TRAZODONE HCL 100 MG PO TABS: 100.0000 mg | ORAL_TABLET | Freq: Every evening | ORAL | Status: DC | PRN

## 2017-09-14 MED ORDER — TRAZODONE HCL 50 MG PO TABS
50.0000 mg | ORAL_TABLET | Freq: Every evening | ORAL | Status: DC | PRN
  Administered 2017-09-14: 50 mg via ORAL
  Filled 2017-09-14: qty 1

## 2017-09-14 MED ORDER — TRAZODONE HCL 50 MG PO TABS
50.0000 mg | ORAL_TABLET | Freq: Once | ORAL | Status: AC
  Administered 2017-09-14: 50 mg via ORAL
  Filled 2017-09-14 (×2): qty 1

## 2017-09-14 MED ORDER — TRAZODONE HCL 100 MG PO TABS
100.0000 mg | ORAL_TABLET | Freq: Every day | ORAL | Status: DC
  Filled 2017-09-14: qty 1

## 2017-09-14 NOTE — Progress Notes (Signed)
Pt did not attend goal/ orientation group this morning. Pt has been sleep most of the morning

## 2017-09-14 NOTE — Progress Notes (Signed)
Patient ID: Dawn KubaAlexa M Jackson, female   DOB: 02/01/1971, 47 y.o.   MRN: [id number]   D: Patient pleasant on approach tonight but  feeling bad due to alcohol withdrawals. CIWA=5 at present but you can see the sweating and some shakiness. Patient drinking gatorades tonight. Ativan protocol continues. Denies any SI at this time  A: Staff will monitor on q 15 minute checks, follow treatment plan, and give medications as ordered. R: Cooperative on the unit.

## 2017-09-14 NOTE — BHH Group Notes (Signed)
Recreation Activity  Date:  09/14/2017  Time:  6:04 PM  Type of Therapy:    :  The purpose of the group is to create a forum where the patients can experience laughter in a benign Bingo game.  Participation Level:  Active  Participation Quality:  Attentive  Affect:  Appropriate  Cognitive:  Alert  Insight:  Improving  Engagement in Group:  Engaged  Modes of Intervention:  Rapport Building  Summary of Progress/Problems:  Rich BraveDuke, Lebert Lovern Lynn 09/14/2017, 6:04 PM

## 2017-09-14 NOTE — Progress Notes (Signed)
Patient did attend the evening speaker AA meeting.  

## 2017-09-14 NOTE — BHH Group Notes (Signed)
BHH Group Notes: (Clinical Social Work)   09/14/2017      Type of Therapy:  Group Therapy   Participation Level:  Did Not Attend despite MHT prompting   Izella Ybanez Grossman-Orr, LCSW 09/14/2017, 11:13 AM     

## 2017-09-14 NOTE — BHH Group Notes (Signed)
Identifying Needs   Date:  09/14/2017  Time:  3:36 PM  Type of Therapy:  Nurse Education  /  The group focuses on teaching patients how to identify their needs and then how to develop the helathy coping skills they need to get some of those needs met.  Participation Level:  Did Not Attend  Participation Quality:    Affect:    Cognitive:    Insight:    Engagement in Group:    Modes of Intervention:    Summary of Progress/Problems:  Lauralyn Primes 09/14/2017, 3:36 PM

## 2017-09-14 NOTE — Plan of Care (Addendum)
Patient sleeping upon approach. Minimal interaction with RN, but most likely due to alcohol withdrawal, Patient has been sleeping most of the day. Tremors felt and sweat visible. Patient pleasant while interacting. Denies SI/HI/AVH. Depression rated 1, hopelessness rated 0, and anxiety related 3 out of 10. Patient's goal today is "take a shower" which she did accomplish. Patient compliant with medication administration. Patient claimed she is "so sleepy from too much Ativan." Patient said she might want to cut back on the dosage since she is "feeling a bit better." Will reassess tonight at 5pm when her next dosage is due. Safety maintained with 15 minute checks. Will continue to monitor and hydrate with Gatorade.   Problem: Medication: Goal: Compliance with prescribed medication regimen will improve Outcome: Progressing  Patient took all medications as prescribed without complications.

## 2017-09-14 NOTE — Progress Notes (Signed)
[facility] MD Progress Note  09/14/2017 1:36 PM Dawn Jackson  MRN:  [id number] Subjective:  Patient reports she is feeling " better". Currently does not report significant symptoms of WDL, but states yesterday did feel " shaky", " sweaty" .States she feels steadier and " less shaky". Denies medication side effects. Denies suicidal ideations. Objective : I have reviewed chart notes and have met with patient. 47 year old female, history of alcohol dependence, admitted under IVC due to suspected Trazodone/ETOH overdose. Patient has denied any suicidal intent. Reports she relapsed on ETOH 1-2 weeks ago. Currently reports she is feeling better. Minimizes withdrawal symptoms at present. Reports improving appetite. Denies suicidal ideations.  As per chart notes, limited milieu participation thus far , tends to stay in her room.  Currently does not present in any acute distress , no restlessness, remains tachycardic . Denies medication side effects.    Principal Problem:  Alcohol Use Disorder , Alcohol WDL, Depression Diagnosis:   Patient Active Problem List   Diagnosis Date Noted  . Major depressive disorder, recurrent severe without psychotic features ([place]) [F33.2] 09/12/2017  . Alcohol dependence with uncomplicated withdrawal ([place]) [F10.230]   . Alcohol use disorder, severe, dependence ([place]) [F10.20] 03/17/2015  . PTSD (post-traumatic stress disorder) [F43.10] 02/02/2015  . Severe recurrent major depression without psychotic features ([place]) [F33.2] 02/02/2015   Total Time spent with patient: 15 minutes  Past Psychiatric History:   Past Medical History:  Past Medical History:  Diagnosis Date  . Allergy   . Anxiety   . Depression   . PTSD (post-traumatic stress disorder)   . Substance abuse [facility])     Past Surgical History:  Procedure Laterality Date  . BREAST BIOPSY    . WISDOM TOOTH EXTRACTION     Family History:  Family History  Problem Relation Age of Onset  . Cancer Mother   . Heart  disease Father   . COPD Maternal Grandfather   . Cancer Paternal Grandmother   . Breast cancer Paternal Grandmother 25   Family Psychiatric  History:  Social History:  Social History   Substance and Sexual Activity  Alcohol Use Yes   Comment: daily usage 1-2 bottles of wine     Social History   Substance and Sexual Activity  Drug Use Yes  . Types: Cocaine    Social History   Socioeconomic History  . Marital status: Married    Spouse name: Not on file  . Number of children: Not on file  . Years of education: Not on file  . Highest education level: Not on file  Occupational History  . Not on file  Social Needs  . Financial resource strain: Not on file  . Food insecurity:    Worry: Not on file    Inability: Not on file  . Transportation needs:    Medical: Not on file    Non-medical: Not on file  Tobacco Use  . Smoking status: Current Every Day Smoker    Packs/day: 1.00    Years: 6.00    Pack years: 6.00    Types: Cigarettes  . Smokeless tobacco: Never Used  Substance and Sexual Activity  . Alcohol use: Yes    Comment: daily usage 1-2 bottles of wine  . Drug use: Yes    Types: Cocaine  . Sexual activity: Never  Lifestyle  . Physical activity:    Days per week: Not on file    Minutes per session: Not on file  . Stress: Not on  file  Relationships  . Social connections:    Talks on phone: Not on file    Gets together: Not on file    Attends religious service: Not on file    Active member of club or organization: Not on file    Attends meetings of clubs or organizations: Not on file    Relationship status: Not on file  Other Topics Concern  . Not on file  Social History Narrative  . Not on file   Additional Social History:   Sleep: improving   Appetite:  Fair  Current Medications: Current Facility-Administered Medications  Medication Dose Route Frequency Provider Last Rate Last Dose  . alum & mag hydroxide-simeth (MAALOX/MYLANTA) 200-200-20 MG/5ML  suspension 30 mL  30 mL Oral Q4H PRN [name], NP      . DULoxetine (CYMBALTA) DR capsule 60 mg  60 mg Oral Daily [name], NP   60 mg at 09/14/17 0815  . hydrOXYzine (ATARAX/VISTARIL) tablet 25 mg  25 mg Oral Q6H PRN Money, [name], FNP   25 mg at 09/13/17 0616  . loperamide (IMODIUM) capsule 2-4 mg  2-4 mg Oral PRN Money, [name], FNP      . LORazepam (ATIVAN) tablet 1 mg  1 mg Oral Q6H PRN Money, [name], FNP      . LORazepam (ATIVAN) tablet 1 mg  1 mg Oral TID Money, [name], FNP   1 mg at 09/14/17 1148   Followed by  . [START ON 09/15/2017] LORazepam (ATIVAN) tablet 1 mg  1 mg Oral BID Money, [name], FNP       Followed by  . [START ON 09/16/2017] LORazepam (ATIVAN) tablet 1 mg  1 mg Oral Daily Money, [name], FNP      . magnesium hydroxide (MILK OF MAGNESIA) suspension 30 mL  30 mL Oral Daily PRN [name], NP      . multivitamin with minerals tablet 1 tablet  1 tablet Oral Daily Money, [name], FNP   1 tablet at 09/14/17 0815  . nicotine polacrilex (NICORETTE) gum 2 mg  2 mg Oral PRN Lupe Bonner, Myer Peer, MD   2 mg at 09/14/17 1149  . ondansetron (ZOFRAN-ODT) disintegrating tablet 4 mg  4 mg Oral Q6H PRN Money, [name], FNP   4 mg at 09/13/17 0616  . thiamine (VITAMIN B-1) tablet 100 mg  100 mg Oral Daily [name], NP   100 mg at 09/14/17 0815  . traZODone (DESYREL) tablet 100 mg  100 mg Oral QHS,MR X 1 Simon, Spencer E, PA-C        Lab Results: No results found for this or any previous visit (from the past 48 hour(s)).  Blood Alcohol level:  Lab Results  Component Value Date   ETH 379 (HH) 09/11/2017   ETH 419 (HH) 36/14/4315    Metabolic Disorder Labs: No results found for: HGBA1C, MPG No results found for: PROLACTIN No results found for: CHOL, TRIG, HDL, CHOLHDL, VLDL, LDLCALC  Physical Findings: AIMS: Facial and Oral Movements Muscles of Facial Expression: None, normal Lips and Perioral Area: None, normal Jaw: None, normal Tongue: None,  normal,Extremity Movements Upper (arms, wrists, hands, fingers): None, normal Lower (legs, knees, ankles, toes): None, normal, Trunk Movements Neck, shoulders, hips: None, normal, Overall Severity Severity of abnormal movements (highest score from questions above): None, normal Incapacitation due to abnormal movements: None, normal Patient's awareness of abnormal movements (rate only patient's report): No Awareness, Dental Status  Current problems with teeth and/or dentures?: Yes Does patient usually wear dentures?: No  CIWA:  CIWA-Ar Total: 3 COWS:     Musculoskeletal: Strength & Muscle Tone: within normal limits minimal distal tremors at this time, no psychomotor agitation Gait & Station: normal Patient leans: N/A  Psychiatric Specialty Exam: Physical Exam  ROS denies headache, no chest pain, no shortness of breath, no vomiting   Blood pressure 96/70, pulse (!) 128, temperature 98.3 F (36.8 C), temperature source Oral, resp. rate 18, height 5' 7"  (1.702 m), weight 56.7 kg (125 lb), SpO2 100 %.Body mass index is 19.58 kg/m.  General Appearance: Fairly Groomed  Eye Contact:  Good  Speech:  Normal Rate  Volume:  Normal  Mood:  reports feeling better  Affect:  vaguely constricted , anxious, smiles briefly at times   Thought Process:  Linear and Descriptions of Associations: Intact  Orientation:  Other:  fully alert and attentive   Thought Content:  no hallucinations, no delusions, not internally preoccupied   Suicidal Thoughts:  No currently denies suicidal or self injurious ideations, denies homicidal or violent ideations  Homicidal Thoughts:  No  Memory:  recent and remote grossly intact   Judgement:  Fair  Insight:  Fair  Psychomotor Activity:  Normal  Concentration:  Concentration: Good and Attention Span: Good  Recall:  Good  Fund of Knowledge:  Good  Language:  Good  Akathisia:  Negative  Handed:  Right  AIMS (if indicated):     Assets:  Communication Skills Desire  for Improvement Resilience  ADL's:  Intact  Cognition:  WNL  Sleep:  Number of Hours: 6.5   Assessment - patient reports partially improved mood, decreased anxiety, denies SI. At this time not endorsing significant alcohol WDL symptoms, but has reported feeling shaky and endorsed diaphoresis earlier. No medication side effects, but reports Ativan is causing sedation.  Treatment Plan Summary: Daily contact with patient to assess and evaluate symptoms and progress in treatment, Medication management, Plan inpatient treatment  and medications as below Encourage group and milieu participation to work on coping skills and symptom reduction Encourage efforts to work on sobriety and relapse prevention  Change detox protocol from standing to PRN based to minimize sedation  Continue Cymbalta 60 mgrs QDAY for depression, anxiety Continue Trazodone 50 mgrs QHS PRN for insomnia as needed  Treatment team working on disposition planning options .   Jenne Campus, MD 09/14/2017, 1:36 PM

## 2017-09-15 DIAGNOSIS — F17210 Nicotine dependence, cigarettes, uncomplicated: Secondary | ICD-10-CM

## 2017-09-15 DIAGNOSIS — Z79899 Other long term (current) drug therapy: Secondary | ICD-10-CM

## 2017-09-15 DIAGNOSIS — F1010 Alcohol abuse, uncomplicated: Secondary | ICD-10-CM

## 2017-09-15 DIAGNOSIS — Z91410 Personal history of adult physical and sexual abuse: Secondary | ICD-10-CM

## 2017-09-15 DIAGNOSIS — F332 Major depressive disorder, recurrent severe without psychotic features: Principal | ICD-10-CM

## 2017-09-15 DIAGNOSIS — Z811 Family history of alcohol abuse and dependence: Secondary | ICD-10-CM

## 2017-09-15 DIAGNOSIS — Z62810 Personal history of physical and sexual abuse in childhood: Secondary | ICD-10-CM

## 2017-09-15 DIAGNOSIS — Z818 Family history of other mental and behavioral disorders: Secondary | ICD-10-CM

## 2017-09-15 DIAGNOSIS — Y908 Blood alcohol level of 240 mg/100 ml or more: Secondary | ICD-10-CM

## 2017-09-15 MED ORDER — ACAMPROSATE CALCIUM 333 MG PO TBEC: 666.0000 mg | DELAYED_RELEASE_TABLET | Freq: Three times a day (TID) | ORAL | 0 refills | Status: DC

## 2017-09-15 MED ORDER — DULOXETINE HCL 60 MG PO CPEP: 60.0000 mg | ORAL_CAPSULE | Freq: Every day | ORAL | 0 refills | Status: DC

## 2017-09-15 MED ORDER — TRAZODONE HCL 100 MG PO TABS: 100.0000 mg | ORAL_TABLET | Freq: Every evening | ORAL | 0 refills | Status: DC | PRN

## 2017-09-15 MED ORDER — ACAMPROSATE CALCIUM 333 MG PO TBEC
666.0000 mg | DELAYED_RELEASE_TABLET | Freq: Three times a day (TID) | ORAL | Status: DC
  Filled 2017-09-15 (×3): qty 2

## 2017-09-15 MED ORDER — HYDROXYZINE HCL 25 MG PO TABS: 25.0000 mg | ORAL_TABLET | Freq: Four times a day (QID) | ORAL | 0 refills | Status: DC | PRN

## 2017-09-15 NOTE — Progress Notes (Signed)
D:  Dawn Jackson has been up and visible on the unit.  She attended evening AA group.  She reported that her day was better than yesterday.  She continues to report some withdrawal symptoms.  CIWA was 11 at 945pm and Ativan given with good relief.  She requested 100mg  of trazodone at bedtime and talked with PA.  Order increased to 100mg  hs.  She continues to report some depression but denies SI/HI or A/V hallucinations.   A:  1:1 with RN for support and encouragement.  Medications as ordered.  Q 15 minute checks maintained for safety.  Encouraged participation in group and unit activities. R:  [name] remains safe on the unit.  We will continue to monitor the progress towards her goals.

## 2017-09-15 NOTE — [facility] Suicide Risk Assessment (Signed)
[facility] INPATIENT:  Family/Significant Other Suicide Prevention Education  Suicide Prevention Education:  Contact Attempts: sister Dawn Jackson [phone] has been identified by the patient as the family member/significant other with whom the patient will be residing, and identified as the person(s) who will aid the patient in the event of a mental health crisis.  With written consent from the patient, two attempts were made to provide suicide prevention education, prior to and/or following the patient's discharge.  We were unsuccessful in providing suicide prevention education.  A suicide education pamphlet was given to the patient to share with family/significant other.  Date and time of first attempt:  09/15/2017  /9:17 AM  - message left Date and time of second attempt: to be done  Dawn Jackson 09/15/2017, 9:16 AM

## 2017-09-15 NOTE — [facility] Suicide Risk Assessment (Signed)
[facility] INPATIENT:  Family/Significant Other Suicide Prevention Education  Suicide Prevention Education:  Education Completed; has been identified by the patient as the family member/significant other with whom the patient will be residing, and identified as the person(s) who will aid the patient in the event of a mental health crisis (suicidal ideations/suicide attempt).  With written consent from the patient, the family member/significant other has been provided the following suicide prevention education, prior to the and/or following the discharge of the patient.  The suicide prevention education provided includes the following:  Suicide risk factors  Suicide prevention and interventions  National Suicide Hotline telephone number  [facility] assessment telephone number  [facility] Emergency Assistance 911  [facility] and/or Residential Mobile Crisis Unit telephone number  Request made of family/significant other to:  Remove weapons (e.g., guns, rifles, knives), all items previously/currently identified as safety concern.    Remove drugs/medications (over-the-counter, prescriptions, illicit drugs), all items previously/currently identified as a safety concern.  The family member/significant other verbalizes understanding of the suicide prevention education information provided.  The family member/significant other agrees to remove the items of safety concern listed above.  Sister confirmed no firearms are in the home.  [place] did [place] the ability to write down the Mobile Crisis number, but was told about it and encouraged to call back to hospital or to any local mental health agency to get that when she returns home.  Dawn Jackson 09/15/2017, 4:17 PM

## 2017-09-15 NOTE — [facility] Suicide Risk Assessment (Signed)
[facility] Discharge Suicide Risk Assessment   Principal Problem: Alcohol Dependence, Alcohol Induced Mood Disorder, Depression, Anxiety  Discharge Diagnoses:  Patient Active Problem List   Diagnosis Date Noted  . Major depressive disorder, recurrent severe without psychotic features ([facility]) [F33.2] 09/12/2017  . Alcohol dependence with uncomplicated withdrawal ([facility]) [F10.230]   . Alcohol use disorder, severe, dependence ([facility]) [F10.20] 03/17/2015  . PTSD (post-traumatic stress disorder) [F43.10] 02/02/2015  . Severe recurrent major depression without psychotic features ([facility]) [F33.2] 02/02/2015    Total Time spent with patient: 30 minutes  Musculoskeletal: Strength & Muscle Tone: within normal limits at this time not tremulous or diaphoretic , does not present restless or agitated , denies headache or visual disturbances  Gait & Station: normal  Patient leans: N/A  Psychiatric Specialty Exam: ROS denies dizziness, denies  headache, no chest pain, no shortness of breath, no vomiting, no diarrhea, no rash, no fever  Blood pressure 92/79, pulse 93, temperature 98.2 F (36.8 C), temperature source Oral, resp. rate 17, height 5\' 7"  (1.702 m), weight 56.7 kg (125 lb), SpO2 100 %.Body mass index is 19.58 kg/m.  General Appearance: improving grooming   Eye Contact::  Good  Speech:  Normal Rate409  Volume:  Normal  Mood:  reports her mood is better,currently described as 7/10  Affect:  more reactive, not irritable at this time  Thought Process:  Linear and Descriptions of Associations: Intact  Orientation:  Full (Time, Place, and Person)  Thought Content:  denies hallucinations, no delusions, not internally preoccupied   Suicidal Thoughts:  No denies suicidal or self injurious ideations, denies any homicidal or violent ideations  Homicidal Thoughts:  No  Memory:  recent and remote grossly intact  Judgement:  Other:  improving   Insight:  improving   Psychomotor Activity:  Normal  Concentration:   Good  Recall:  Good  Fund of Knowledge:Good  Language: Good  Akathisia:  Negative  Handed:  Right  AIMS (if indicated):     Assets:  Communication Skills Desire for Improvement Resilience  Sleep:  Number of Hours: 4.5  Cognition: WNL  ADL's:  Intact   Mental Status Per Nursing Assessment::   On Admission:  NA  Demographic Factors:  47 year old married, employed, has a 36 year old daughter who lives with the father.  Loss Factors: Loss of daughter's custody   Historical Factors: History of alcohol use disorder , history of anxiety, history of PTSD, no prior history of suicidal attempts  Risk Reduction Factors:   Employed, Positive social support and Positive coping skills or problem solving skills  Continued Clinical Symptoms:  At this time patient is alert, attentive, calm, mood is improved and currently denies depression, affect is more reactive, fuller in range, no thought disorder, no suicidal or self injurious ideations, no homicidal or violent ideations, no hallucinations, no delusions, not internally preoccupied .  Thus far tolerating medications well, denies side effects.  We discussed treatment options, agrees to Campral - side effects discussed . She states oral Naltrexone was not particularly effective for her during a prior trial.  Reports high level of motivation in sobriety, and states she is active in [place]. Behavior on unit in good control  Cognitive Features That Contribute To Risk:  No gross cognitive deficits noted upon discharge. Is alert , attentive, and oriented x 3   Suicide Risk:  Mild:  Suicidal ideation of limited frequency, intensity, duration, and specificity.  There are no identifiable plans, no associated intent, mild dysphoria and  related symptoms, good self-control (both objective and subjective assessment), few other risk factors, and identifiable protective factors, including available and accessible social support.    Plan Of Care/Follow-up  recommendations:  Activity:  as tolerated  Diet:  regular Tests:  NA Other:  See below  Patient is requesting discharge, states she needs to return to work tomorrow or might lose her job. There are no current grounds for ongoing involuntary commitment . She plans to return home. Plans to participate in AA regularly .  [facility]   Craige CottaFernando A Samuella Rasool, MD 09/15/2017, 7:59 AM

## 2017-09-15 NOTE — Progress Notes (Addendum)
  [facility] :  Will you be returning to the same living situation after discharge:  Yes,  home At discharge, do you have transportation home?: Yes,  Lyft Do you have the ability to pay for your medications: No.  No income, no insurance  Release of information consent forms completed and turned in to Medical Records by CSW.   Patient to Follow up at: Follow-up Information    [facility] Follow up.   Specialty:  Behavioral Health Why:  Patient's sister has made an appointment for therapy and patient has an upcoming psychiatric appointment.  She feels comfortable with taking care of these on her own and does not require assistance.  Sister called back and stated therapy appointment is Friday 09/20/17 at 1:00pm Contact information: [address] [place] [address] [phone]           Next level of care provider has [facility] to [facility] Link:no  Safety Planning and Suicide Prevention discussed: Yes,  with patient, sister (done with sister after pt discharged)  Have you used any form of tobacco in the last 30 days? (Cigarettes, Smokeless Tobacco, Cigars, and/or Pipes): Yes  Has patient been referred to the Quitline?: Patient refused referral  Patient has been referred for addiction treatment: Yes  Lynnell ChadMareida J Grossman-Orr, LCSW 09/15/2017, 9:49 AM

## 2017-09-15 NOTE — Plan of Care (Signed)
Patient self inventory: Patient slept well last night and sleep medication helped.Appetite has been good, energy level low, and concentration good. Depression, hopelessness, and anxiety rated 1, 0, 1. Patient is withdrawing, but is not experiencing any symptoms. Denies SI/HI/AVH. Denies physical problems. Patient's goal is "to go home and get ready for work tomorrow." Patient compliant with medications prescribed per provider. Patient safety maintained with 15 minute checks.   Problem: Education: Goal: Mental status will improve Outcome: Adequate for Discharge
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
## 2017-09-15 NOTE — Progress Notes (Signed)
Discharge note: Patient reviewed discharge paperwork with RN including prescriptions, follow up appointments, and lab work. Patient given the opportunity to ask questions. All concerns were addressed. All belongings were returned to patient. Denied SI/HI/AVH. Patient thanked staff for their care while at the hospital. Patient discharged to lobby.

## 2017-09-15 NOTE — Plan of Care (Signed)
Vital signs have remained stable, she continues to have tremors and sweating.  We will continue to monitor the progress towards her goals.

## 2017-09-15 NOTE — [facility] Group Notes (Signed)
[facility] Group Notes: (Clinical Social Work)   09/15/2017      Type of Therapy:  Group Therapy   Participation Level:  Did Not Attend - discharged   Ambrose MantleMareida Grossman-Orr, LCSW 09/15/2017, 11:19 AM

## 2017-09-16 ENCOUNTER — Other Ambulatory Visit (HOSPITAL_COMMUNITY): Payer: Self-pay | Admitting: Medical

## 2017-09-16 MED ORDER — BACLOFEN 10 MG PO TABS: 10.0000 mg | ORAL_TABLET | Freq: Three times a day (TID) | ORAL | 1 refills | Status: DC

## 2017-09-16 NOTE — Progress Notes (Signed)
Pt unable to afford Acamprosite.Baclofen rx sent

## 2017-09-28 ENCOUNTER — Ambulatory Visit (HOSPITAL_COMMUNITY)
Admission: RE | Admit: 2017-09-28 | Discharge: 2017-09-28 | Disposition: A | Discharge status: Home / Self Care | Payer: No Typology Code available for payment source | Attending: Psychiatry | Admitting: Psychiatry

## 2017-09-28 ENCOUNTER — Encounter (HOSPITAL_COMMUNITY): Payer: Self-pay | Admitting: Emergency Medicine

## 2017-09-28 ENCOUNTER — Emergency Department (HOSPITAL_COMMUNITY)
Admission: EM | Admit: 2017-09-28 | Discharge: 2017-09-28 | Disposition: A | Discharge status: Home / Self Care | Payer: Self-pay | Attending: Emergency Medicine | Admitting: Emergency Medicine

## 2017-09-28 ENCOUNTER — Other Ambulatory Visit: Payer: Self-pay

## 2017-09-28 DIAGNOSIS — Z79899 Other long term (current) drug therapy: Secondary | ICD-10-CM | POA: Insufficient documentation

## 2017-09-28 DIAGNOSIS — F10920 Alcohol use, unspecified with intoxication, uncomplicated: Secondary | ICD-10-CM

## 2017-09-28 DIAGNOSIS — F10229 Alcohol dependence with intoxication, unspecified: Secondary | ICD-10-CM | POA: Insufficient documentation

## 2017-09-28 DIAGNOSIS — Z8249 Family history of ischemic heart disease and other diseases of the circulatory system: Secondary | ICD-10-CM | POA: Insufficient documentation

## 2017-09-28 DIAGNOSIS — F4310 Post-traumatic stress disorder, unspecified: Secondary | ICD-10-CM | POA: Insufficient documentation

## 2017-09-28 DIAGNOSIS — F17210 Nicotine dependence, cigarettes, uncomplicated: Secondary | ICD-10-CM | POA: Insufficient documentation

## 2017-09-28 DIAGNOSIS — F10220 Alcohol dependence with intoxication, uncomplicated: Secondary | ICD-10-CM | POA: Insufficient documentation

## 2017-09-28 DIAGNOSIS — F332 Major depressive disorder, recurrent severe without psychotic features: Secondary | ICD-10-CM | POA: Diagnosis not present

## 2017-09-28 DIAGNOSIS — F419 Anxiety disorder, unspecified: Secondary | ICD-10-CM | POA: Diagnosis not present

## 2017-09-28 DIAGNOSIS — Z043 Encounter for examination and observation following other accident: Secondary | ICD-10-CM | POA: Insufficient documentation

## 2017-09-28 DIAGNOSIS — W19XXXA Unspecified fall, initial encounter: Secondary | ICD-10-CM

## 2017-09-28 NOTE — H&P (Addendum)
Behavioral Health Medical Screening Exam  Dawn Jackson is an 47 y.o. female [facility]. Reports drinking 1 pint of wine daily. States she was recently discharged and was advised to follow-up with her primary psychiatrist where she reports her medication was adjusted. Reports mild depression. Patient is currently denying suicidal or homicidal ideations. Patient to be transported for medical clearance due to reported standing height fall.  Support encouragement and reassurance was provided   Total Time spent with patient: 15 minutes  Psychiatric Specialty Exam: Physical Exam  Vitals reviewed. Constitutional: She is oriented to person, place, and time. She appears well-developed.  Neurological: She is alert and oriented to person, place, and time.  Psychiatric: She has a normal mood and affect.    Review of Systems  Psychiatric/Behavioral: Positive for substance abuse.  All other systems reviewed and are negative.   There were no vitals taken for this visit.There is no height or weight on file to calculate BMI.  General Appearance: Casual  Eye Contact:  Good  Speech:  Clear and Coherent  Volume:  Normal  Mood:  Anxious and Irritable  Affect:  Labile  Thought Process:  Coherent  Orientation:  Full (Time, Place, and Person)  Thought Content:  NA  Suicidal Thoughts:  No  Homicidal Thoughts:  No  Memory:  Immediate;   Fair Recent;   Fair Remote;   Fair  Judgement:  Fair  Insight:  Fair  Psychomotor Activity:  Normal  Concentration: Concentration: Fair  Recall:  Good  Fund of Knowledge:Fair  Language: Fair  Akathisia:  No  Handed:  Right  AIMS (if indicated):     Assets:  Communication Skills Desire for Improvement Financial Resources/Insurance Social Support  Sleep:       Musculoskeletal: Strength & Muscle Tone: within normal limits Gait & Station: unsteady Patient leans: N/A   There were no vitals taken for this visit.  B/P 94/71 HR 82 O2 98  % , Temp 98.8  Recommendations:  Based on my evaluation the patient appears to have an emergency medical condition for which I recommend the patient be transferred to the emergency department for further evaluation.   Patient to be reassessed after medical clearance and BAL collection  Dawn Rackanika N Lewis, NP 09/28/2017, 8:51 AM   Agree with NP assessment

## 2017-09-28 NOTE — ED Triage Notes (Signed)
[facility].  [facility] was drinking 2 bottles of wine within the last  2 days.  No food intake.  Patient is requesting help for detox.  [facility] was [facility].  Patient remembers falling, states she loss balance due to ETOH.  Denies hitting head.  No obvious injuries.   Denies chest pain, LOC, or head injury.  [facility].  No N/V/ or D.  EMS vitals:  110/70 BP 70 HR  16 R CBG 158 100 O2 on room air
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
## 2017-09-28 NOTE — ED Notes (Signed)
Patient was able to ambulate on floor within any issues.  She dranked a soda without any issues.

## 2017-09-28 NOTE — ED Provider Notes (Signed)
Emergency Department Provider Note   I have reviewed the triage vital signs and the nursing notes.   HISTORY  Chief Complaint Fall and Alcohol Intoxication   HPI Dawn [facility] is a 47 y.o. female with PMH of EtOH abuse, PTSD, and anxiety presents to the emergency department from outpatient behavioral health after falling.  The patient states she has been drinking heavily over the last 2 weeks.  She has past history of alcohol abuse but had been clean for 4 months after pursuing AA.  Last night she drank 1.5 bottles of wine and describes herself as "drunk."  She apparently went to behavioral health and fell off of the bench while eating breakfast.  She states she landed on her butt.  She denies any head injury, neck pain, pain in her arms or legs.  Behavioral health staff evaluated her briefly and sent her to the emergency department for medical clearance. Patient denies any SI/HI.  She tells me "I just need to go home and sleep it off."   Past Medical History:  Diagnosis Date  . Allergy   . Anxiety   . Depression   . PTSD (post-traumatic stress disorder)   . Substance abuse [facility])     Patient Active Problem List   Diagnosis Date Noted  . Major depressive disorder, recurrent severe without psychotic features (HCC) 09/12/2017  . Alcohol dependence with alcohol-induced mood disorder (HCC)   . Alcohol use disorder, severe, dependence (HCC) 03/17/2015  . PTSD (post-traumatic stress disorder) 02/02/2015  . Severe recurrent major depression without psychotic features (HCC) 02/02/2015    Past Surgical History:  Procedure Laterality Date  . BREAST BIOPSY    . WISDOM TOOTH EXTRACTION      Current Outpatient Rx  . Order #: [id number] Class: Print  . Order #: [id number] Class: Normal  . Order #: [id number] Class: Print  . Order #: [id number] Class: Print  . Order #: [id number] Class: Print    Allergies Patient has no known allergies.  Family History  Problem Relation Age of Onset  .  Cancer Mother   . Heart disease Father   . COPD Maternal Grandfather   . Cancer Paternal Grandmother   . Breast cancer Paternal Grandmother 50    Social History Social History   Tobacco Use  . Smoking status: Current Every Day Smoker    Packs/day: 1.00    Years: 6.00    Pack years: 6.00    Types: Cigarettes  . Smokeless tobacco: Never Used  Substance Use Topics  . Alcohol use: Yes    Comment: daily usage 1-2 bottles of wine  . Drug use: Yes    Types: Cocaine    Review of Systems  Constitutional: No fever/chills Eyes: No visual changes. ENT: No sore throat. Cardiovascular: Denies chest pain. Respiratory: Denies shortness of breath. Gastrointestinal: No abdominal pain.  No nausea, no vomiting.  No diarrhea.  No constipation. Genitourinary: Negative for dysuria. Musculoskeletal: Negative for back pain. Skin: Negative for rash. Neurological: Negative for headaches, focal weakness or numbness.  10-point ROS otherwise negative.  ____________________________________________   PHYSICAL EXAM:  VITAL SIGNS: ED Triage Vitals  Enc Vitals Group     BP 09/28/17 0929 95/63     Pulse Rate 09/28/17 0929 87     Resp 09/28/17 0929 18     Temp 09/28/17 0929 98.3 F (36.8 C)     Temp Source 09/28/17 0929 Oral     SpO2 09/28/17 0929 97 %     Weight 09/28/17 0931 125  lb (56.7 kg)     Height 09/28/17 0931 5\' 7"  (1.702 m)     Pain Score 09/28/17 0931 0   Constitutional: Alert and oriented. Appears mildly intoxicated but able to provide a full history.  Eyes: Conjunctivae are normal. PERRL.  Head: Atraumatic. Nose: No congestion/rhinnorhea. Mouth/Throat: Mucous membranes are moist.  Neck: No stridor. No cervical spine tenderness to palpation. Cardiovascular: Normal rate, regular rhythm. Good peripheral circulation. Grossly normal heart sounds.   Respiratory: Normal respiratory effort.  No retractions. Lungs CTAB. Gastrointestinal: Soft and nontender. No distention.    Musculoskeletal: No lower extremity tenderness nor edema. No gross deformities of extremities. Neurologic:  Normal speech and language. No gross focal neurologic deficits are appreciated.  Skin:  Skin is warm, dry and intact. No rash noted.  ____________________________________________   PROCEDURES  Procedure(s) performed:   Procedures  None ____________________________________________   INITIAL IMPRESSION / ASSESSMENT AND PLAN / ED COURSE  Pertinent labs & imaging results that were available during my care of the patient were reviewed by me and considered in my medical decision making (see chart for details).  Patient presents to the emergency department for evaluation after minor fall while waiting at behavioral health.  She was attempting to get assistance with alcohol detox.  She has no history of complicated alcohol withdrawal.  She appears mildly intoxicated but is able to provide a full history and is cooperative.  She has no evidence of head trauma.  No evidence of cervical spine injury.  She is able to move her arms and legs without difficulty or pain.  Reports falling off of a bench and landing on her buttocks.  Do not feel the patient requires any imaging or lab work at this time.  Plan for PO challenge and ambulation prior to D/C.   10:20 AM Patient ambulated without assistance. Ready for discharge.   At this time, I do not feel there is any life-threatening condition present. I have reviewed and discussed all results (EKG, imaging, lab, urine as appropriate), exam findings with patient. I have reviewed nursing notes and appropriate previous records.  I feel the patient is safe to be discharged home without further emergent workup. Discussed usual and customary return precautions. Patient and family (if present) verbalize understanding and are comfortable with this plan.  Patient will follow-up with their primary care provider. If they do not have a primary care provider,  information for follow-up has been provided to them. All questions have been answered.  ____________________________________________  FINAL CLINICAL IMPRESSION(S) / ED DIAGNOSES  Final diagnoses:  Alcoholic intoxication without complication (HCC)  Fall, initial encounter    Note:  This document was prepared using Dragon voice recognition software and may include unintentional dictation errors.  Alona BeneJoshua Anneta Rounds, MD Emergency Medicine    Jahron Hunsinger, Arlyss RepressJoshua G, MD 09/28/17 1021

## 2017-09-28 NOTE — ED Notes (Signed)
Pt  Is A & O x4.  She understood AVS instructions.

## 2017-09-28 NOTE — ED Notes (Signed)
Bed: [id number] Expected date:  Expected time:  Means of arrival:  Comments: 47 yo ETOH, fall

## 2017-09-28 NOTE — Discharge Instructions (Signed)
You were seen in the emergency department for alcohol intoxication.  Please seek help from the recommended resources for assistance with your alcohol dependence.  If you have any thoughts of hurting herself or others, please call 911 or return to the emergency department.  Please avoid drug and alcohol use.  Never drive a vehicle or operate machinery while intoxicated.  Substance Abuse Treatment Programs  Intensive Outpatient Programs [facility]     [address]      [place], [place]                   [phone]       [facility] [address] [place] [address] [place], [place] [phone]  [facility]     (Inpatient and outpatient)     [address]           [phone]    [facility] [phone] (Suboxone and Methadone)  [address]      [place], [place] [address]      [phone]       [address] [place], [place] [phone]  [facility] (Outpatient/Inpatient, Chemical)    (insurance only) [phone]             [facility] (Groups & Residential) [place], [place] [phone]     [facility]     [address]     [place], [place]      [phone]       [facility] (for caregivers and family) [phone] [address] [place], [place] [phone]      Residential Treatment Programs [facility]      [address], [place], [place] [address]  [phone]       [facility] [address] [place], [place] [address] [phone]  [facility] [place]        [phone]       [facility] [phone]  [facility] ([facility]             [address]                                         [place], [place]                                                [phone] or [phone]                               [facility] [place] [facility] [place] [address] [place], [address] [phone]  [facility]    [address]      [place], [place]     [phone]       [facility] [address] [place], [place] [phone]  [facility]   [address] [place], [place] [address]     [phone]      Admissions: 8am-3pm M-F  [facility] (RTS) [address] [place], [place] [phone]  [facility]: Residential Program [phone] Days)   [place], [place]      [phone] or [phone]  [facility]: [facility] [place], [place] (Walk in Hours over the weekend or by referral)  [facility] [address] [place], [place], [place] [phone]  Crisis Mobile: [facility]:  [phone] (for crisis response 24 hours a day) [facility] Hotline:      [phone] Outpatient Psychiatry and Counseling  [facility]: Mobile Crisis Management 24 hours:  [phone]  [facility] sliding scale fee and walk in schedule: M-F 8am-12pm/1pm-3pm [address]  [place], [place] [phone]  [facility] [address] [place], [place] [address] [phone]  [facility] ([facility] new patient walk-in appointments available Monday - Friday 8am -3pm.          [address] [place], [place] [address] [phone] or crisis line- [phone]  [facility]/ Intensive Outpatient Therapy Program [address] [place], [place] [address] [phone]  [facility]                  Crisis Services      [phone]      [address]     [place], [place] [address]                 [facility]   [facility] [phone] [address] [place], [place] [address]   [facility]          [address] [place],  [place], [place] [address]       [phone]  [phone]  [facility] [address] [place], [place] [address] [phone]  [facility]    [address]    [place], [place] [address]     [phone]       Andee PolesParish McKinney, MD     [address]     [place] [address]     [phone]       [facility] [address] [place] [address]  [facility]     [address]     [place], [place]      [phone]       [facility] Eulogio DitchShamsher Ahluwalia, MD [address] [place], [place] [address] [phone]  [facility]     [address]     [place], [place] [address]     [phone]       [facility] [address] [place], [place] [address] [phone] Resources for Temporary Residential Assistance/Crisis Centers  DAY CENTERS [facility](IR[facility]) M-F 8am-3pm   [address] [place], [place] [address]   [phone] Services include: laundry, barbering, support groups, case management, phone  & computer access, showers, AA/NA mtgs, mental health/substance abuse nurse, job skills class, disability information, VA assistance, spiritual classes, etc.   HOMELESS SHELTERS  [facility]     [facility]   [address], [place]     [phone]              [facility] (women and children)       [address] [place], [place] [address] [phone] [email or web address] for application and process Application Required  [facility]   [address]    [place] [address]     [phone]  [place], [place] [address] [id number] application appt.) Application Required  [facility] (women only)    [address]     [place], [place] [address]     [phone]      Intake starts 6pm daily Need  valid ID, SSC, & Police report [employer] [address] [place], [place] [phone] Application Required  [employer] (men only)     [place]      [place], [place]       [place] (Pregnant women only) [address] [place], [place]  [facility]      [address]      [place], [place] [address]     [phone]             [facility] [address] [place], [place] 90 day commitment/SA/Application process  [facility](men only)     [address]     [place], [place]       Check-in at [address] [facility] [address] [place], [place] [address] [phone] Men/Women/Women and Children must be there by 7 pm  [place], [place]

## 2017-09-30 ENCOUNTER — Ambulatory Visit (HOSPITAL_COMMUNITY)
Admission: AD | Admit: 2017-09-30 | Discharge: 2017-09-30 | Disposition: A | Discharge status: Home / Self Care | Payer: No Typology Code available for payment source | Attending: Psychiatry | Admitting: Psychiatry

## 2017-10-01 ENCOUNTER — Emergency Department (HOSPITAL_COMMUNITY)
Admission: EM | Admit: 2017-10-01 | Discharge: 2017-10-01 | Discharge status: Against Medical Advice | Payer: Self-pay | Attending: Emergency Medicine | Admitting: Emergency Medicine

## 2017-10-01 ENCOUNTER — Encounter (HOSPITAL_COMMUNITY): Payer: Self-pay

## 2017-10-01 DIAGNOSIS — Z79899 Other long term (current) drug therapy: Secondary | ICD-10-CM | POA: Insufficient documentation

## 2017-10-01 DIAGNOSIS — F10920 Alcohol use, unspecified with intoxication, uncomplicated: Secondary | ICD-10-CM

## 2017-10-01 DIAGNOSIS — F10929 Alcohol use, unspecified with intoxication, unspecified: Secondary | ICD-10-CM | POA: Insufficient documentation

## 2017-10-01 DIAGNOSIS — F17210 Nicotine dependence, cigarettes, uncomplicated: Secondary | ICD-10-CM | POA: Insufficient documentation

## 2017-10-01 LAB — COMPREHENSIVE METABOLIC PANEL
ALT: 25 U/L (ref 14–54)
AST: 40 U/L (ref 15–41)
Albumin: 4.6 g/dL (ref 3.5–5.0)
Alkaline Phosphatase: 49 U/L (ref 38–126)
Anion gap: 13 (ref 5–15)
BUN: 6 mg/dL (ref 6–20)
CO2: 23 mmol/L (ref 22–32)
Calcium: 8.8 mg/dL — ABNORMAL LOW (ref 8.9–10.3)
Chloride: 110 mmol/L (ref 101–111)
Creatinine, Ser: 0.55 mg/dL (ref 0.44–1.00)
GFR calc Af Amer: >60 mL/min (ref >60)
GFR calc non Af Amer: >60 mL/min (ref >60)
Glucose, Bld: 100 mg/dL — ABNORMAL HIGH (ref 65–99)
Potassium: 3.8 mmol/L (ref 3.5–5.1)
Sodium: 146 mmol/L — ABNORMAL HIGH (ref 135–145)
Total Bilirubin: 0.7 mg/dL (ref 0.3–1.2)
Total Protein: 7.2 g/dL (ref 6.5–8.1)

## 2017-10-01 LAB — ETHANOL: Alcohol, Ethyl (B): 443 mg/dL (ref <10)

## 2017-10-01 LAB — CBC
HCT: 43.5 % (ref 36.0–46.0)
Hemoglobin: 14.7 g/dL (ref 12.0–15.0)
MCH: 34.2 pg — ABNORMAL HIGH (ref 26.0–34.0)
MCHC: 33.8 g/dL (ref 30.0–36.0)
MCV: 101.2 fL — ABNORMAL HIGH (ref 78.0–100.0)
Platelets: 222 10*3/uL (ref 150–400)
RBC: 4.3 MIL/uL (ref 3.87–5.11)
RDW: 15.9 % — ABNORMAL HIGH (ref 11.5–15.5)
WBC: 6.3 10*3/uL (ref 4.0–10.5)

## 2017-10-01 LAB — RAPID URINE DRUG SCREEN, HOSP PERFORMED
Amphetamines: NOT DETECTED
Benzodiazepines: NOT DETECTED
Cocaine: NOT DETECTED
Opiates: NOT DETECTED
Tetrahydrocannabinol: NOT DETECTED

## 2017-10-01 LAB — POC URINE PREG, ED: Preg Test, Ur: NEGATIVE

## 2017-10-01 MED ORDER — NICOTINE 21 MG/24HR TD PT24
21.0000 mg | MEDICATED_PATCH | Freq: Once | TRANSDERMAL | Status: DC
  Administered 2017-10-01: 21 mg via TRANSDERMAL
  Filled 2017-10-01: qty 1

## 2017-10-01 MED ORDER — ACETAMINOPHEN 500 MG PO TABS
1000.0000 mg | ORAL_TABLET | Freq: Once | ORAL | Status: AC
  Administered 2017-10-01: 1000 mg via ORAL
  Filled 2017-10-01: qty 2

## 2017-10-01 NOTE — ED Triage Notes (Signed)
[facility] and was told to come here, pt is highly intoxicated, states that all she has done is drink for the past four days, reports drinking two bottles of wine, denies illegal drug use, pt denies SI/HI/AVH

## 2017-10-01 NOTE — H&P (Signed)
Behavioral Health Medical Screening Exam  Dawn Jackson is an 47 y.o. female who [facility]. Patient was assessed for same on 09/29/15. Patient is clinically intoxicated. Slurred speech, gait unsteady, becomes irate when questioned about alcohol use today. States that she drank 1.5 bottles of wine today. Reports that she has fallen multiple times today and hit her head on concrete. No obvious head injuries. Unable to complete assessment. [facility] for [facility] and medical clearance.  Total Time spent with patient: 15 minutes  Psychiatric Specialty Exam: Physical Exam  Constitutional: She appears well-developed and well-nourished. No distress.  HENT:  Head: Normocephalic and atraumatic.  Right Ear: External ear normal.  Left Ear: External ear normal.  Eyes: Pupils are equal, round, and reactive to light. Conjunctivae are normal. Right eye exhibits no discharge. Left eye exhibits no discharge. No scleral icterus.  Respiratory: Effort normal. No respiratory distress.  Neurological: She is alert. She has normal strength. Gait abnormal. GCS eye subscore is 4. GCS verbal subscore is 5. GCS motor subscore is 6.  Skin: She is not diaphoretic.    Review of Systems  Unable to perform ROS: Other (Acute Intoxication)    Blood pressure 120/75, pulse 98, temperature 98.6 F (37 C), resp. rate 16, SpO2 97 %.There is no height or weight on file to calculate BMI.  General Appearance: Disheveled  Eye Contact:  Poor  Speech:  Slurred  Volume:  Normal  Mood:  Irritable  AIMS (if indicated):     Assets:    Sleep:       Musculoskeletal: Strength & Muscle Tone: within normal limits Gait & Station: unsteady   Blood pressure 120/75, pulse 98, temperature 98.6 F (37 C), resp. rate 16, SpO2 97 %.  Recommendations:  [facility] for observation and medical clearance.  Dawn PolingJason A Hiedi [place], NP 10/01/2017, 12:03 AM

## 2017-10-01 NOTE — ED Notes (Signed)
Unable to locate pt. Was informed by NT that pt was ambulatory and has left. Tresa EndoKelly PA made aware

## 2017-10-01 NOTE — ED Provider Notes (Signed)
[facility] EMERGENCY DEPARTMENT Provider Note   CSN: [id number] Arrival date & time: 10/01/17  0022     History   Chief Complaint Chief Complaint  Patient presents with  . Alcohol Intoxication    HPI Dawn Jackson is a 47 y.o. female.   47 year old female with a history of anxiety, depression, PTSD, substance abuse presents to the ED from behavioral health.  She states that she was brought to [facility] by members of her AA group.  She has been drinking heavily for the past 4 days and reports drinking 2 bottles of wine.  She was seen for alcohol intoxication 2 days ago.  She denies any suicidal or homicidal thoughts.  No illicit drug use.  Patient reports sobriety from January until May.  She is currently complaining of a frontal headache.  She reports that symptoms began after falling in her yard.     Past Medical History:  Diagnosis Date  . Allergy   . Anxiety   . Depression   . PTSD (post-traumatic stress disorder)   . Substance abuse [facility]     Patient Active Problem List   Diagnosis Date Noted  . Major depressive disorder, recurrent severe without psychotic features (HCC) 09/12/2017  . Alcohol dependence with alcohol-induced mood disorder (HCC)   . Alcohol use disorder, severe, dependence (HCC) 03/17/2015  . PTSD (post-traumatic stress disorder) 02/02/2015  . Severe recurrent major depression without psychotic features (HCC) 02/02/2015    Past Surgical History:  Procedure Laterality Date  . BREAST BIOPSY    . WISDOM TOOTH EXTRACTION       OB History   None      Home Medications    Prior to Admission medications   Medication Sig Start Date End Date Taking? Authorizing Provider  acamprosate (CAMPRAL) 333 MG tablet Take 2 tablets (666 mg total) by mouth 3 (three) times daily with meals. For cravings 09/15/17   Money, Gerlene Burdock, FNP  baclofen (LIORESAL) 10 MG tablet Take 1 tablet (10 mg total) by mouth 3 (three) times daily. 09/16/17 11/15/17  Court Joy, PA-C  DULoxetine (CYMBALTA) 60 MG capsule Take 1 capsule (60 mg total) by mouth daily. For mood control 09/16/17   Money, Gerlene Burdock, FNP  hydrOXYzine (ATARAX/VISTARIL) 25 MG tablet Take 1 tablet (25 mg total) by mouth every 6 (six) hours as needed for anxiety. 09/15/17   Money, Gerlene Burdock, FNP  traZODone (DESYREL) 100 MG tablet Take 1 tablet (100 mg total) by mouth at bedtime as needed for sleep. 09/15/17   Money, Gerlene Burdock, FNP    Family History Family History  Problem Relation Age of Onset  . Cancer Mother   . Heart disease Father   . COPD Maternal Grandfather   . Cancer Paternal Grandmother   . Breast cancer Paternal Grandmother 68    Social History Social History   Tobacco Use  . Smoking status: Current Every Day Smoker    Packs/day: 1.00    Years: 6.00    Pack years: 6.00    Types: Cigarettes  . Smokeless tobacco: Never Used  Substance Use Topics  . Alcohol use: Yes    Comment: daily usage 1-2 bottles of wine  . Drug use: Yes    Types: Cocaine     Allergies   Patient has no known allergies.   Review of Systems Review of Systems Ten systems reviewed and are negative for acute change, except as noted in the HPI.    Physical  Exam Updated Vital Signs BP 118/85   Pulse 85   Temp (!) 97 F (36.1 C) (Oral)   Resp 16   SpO2 96%   Physical Exam  Constitutional: She appears well-developed and well-nourished. No distress.  Nontoxic appearing and in no distress.  HENT:  Head: Normocephalic and atraumatic.  No palpable injury to scalp.  No bony deformity, battle sign, raccoon's eyes.  Eyes: Conjunctivae and EOM are normal. No scleral icterus.  Neck: Normal range of motion.  Pulmonary/Chest: Effort normal. No respiratory distress.  Respirations even and unlabored  Musculoskeletal: Normal range of motion.  Neurological: She is alert.  GCS 15.  Speech is slurred.  Patient moving all extremities spontaneously.  No focal deficits noted.  Skin: Skin is warm and  dry. No rash noted. She is not diaphoretic. No erythema. No pallor.  Psychiatric: She has a normal mood and affect. Her behavior is normal.  Nursing note and vitals reviewed.    ED Treatments / Results  Labs (all labs ordered are listed, but only abnormal results are displayed) Labs Reviewed  COMPREHENSIVE METABOLIC PANEL - Abnormal; Notable for the following components:      Result Value   Sodium 146 (*)    Glucose, Bld 100 (*)    Calcium 8.8 (*)    All other components within normal limits  ETHANOL - Abnormal; Notable for the following components:   Alcohol, Ethyl (B) 443 (*)    All other components within normal limits  CBC - Abnormal; Notable for the following components:   MCV 101.2 (*)    MCH 34.2 (*)    RDW 15.9 (*)    All other components within normal limits  RAPID URINE DRUG SCREEN, HOSP PERFORMED - Abnormal; Notable for the following components:   Barbiturates   (*)    Value: Result not available. Reagent lot number recalled by manufacturer.   All other components within normal limits  I-STAT BETA HCG BLOOD, ED ([facility], [facility], [facility] ONLY)  POC URINE PREG, ED    EKG None  Radiology No results found.  Procedures Procedures (including critical care time)  Medications Ordered in ED Medications  nicotine (NICODERM CQ - dosed in mg/24 hours) patch 21 mg (21 mg Transdermal Patch Applied 10/01/17 0147)  acetaminophen (TYLENOL) tablet 1,000 mg (1,000 mg Oral Given 10/01/17 0147)     Initial Impression / Assessment and Plan / ED Course  I have reviewed the triage vital signs and the nursing notes.  Pertinent labs & imaging results that were available during my care of the patient were reviewed by me and considered in my medical decision making (see chart for details).     1:35 AM Patient assessed.  She is visibly intoxicated with slurred speech.  History of alcohol abuse.  She was transferred to the emergency department from behavioral health.  The patient is now stating  that she wants to go home.  She denies suicidal and homicidal thoughts.  Will try to reach out to husband about picking the patient up from the ED.  I do not feel she is currently stable or safe to be discharged on her own accord.  3:50 AM Notified by RN that patient unable to be located in the department.  Nurse was previously informed that the patient was wanting to leave.  She was seen ambulating in the ED without difficulty.  Previously fed a sandwich and soda.  Vital signs have been stable.  Disposition set to eloped.   Final Clinical  Impressions(s) / ED Diagnoses   Final diagnoses:  Alcoholic intoxication without complication [facility](HCC)    ED Discharge Orders    None       Antony MaduraHumes, Ece Cumberland, PA-C 10/01/17 0410    Dione BoozeGlick, David, MD 10/01/17 0600

## 2017-10-01 NOTE — BH Assessment (Addendum)
[facility] Assessment Progress Note   Clinician accompanied Nira ConnJason Berry, FNP to talk to patient in the lobby.  Patient is very obviously intoxicated.  She is unable to sit up straight, is slurring her words.  Patient told Barbara CowerJason that she had fallen several times and hit her head.  Barbara CowerJason examined her head and noted not bleeding.    This clinician talked to patient about seeing her on 09/11/17 and that her BAL was elevated at that time.  Patient got up and walked (unsteadily) out of the lobby and into the parking lot.  Security was able to persuade her to come back in.  Patient is in need of medical clearance before being assessed since she had numerous falls and was obviously very intoxicated.    [facility]  Charge [facility] Irving [facility]Emily [facility] informed of patient.

## 2017-10-01 NOTE — ED Notes (Signed)
ED Provider at bedside. 

## 2017-10-02 DIAGNOSIS — F331 Major depressive disorder, recurrent, moderate: Secondary | ICD-10-CM | POA: Insufficient documentation

## 2017-10-02 DIAGNOSIS — F10230 Alcohol dependence with withdrawal, uncomplicated: Secondary | ICD-10-CM | POA: Insufficient documentation

## 2017-10-22 ENCOUNTER — Ambulatory Visit (INDEPENDENT_AMBULATORY_CARE_PROVIDER_SITE_OTHER): Payer: Self-pay | Admitting: Psychology

## 2017-10-22 ENCOUNTER — Encounter (HOSPITAL_COMMUNITY): Payer: Self-pay | Admitting: Psychology

## 2017-10-22 DIAGNOSIS — F1020 Alcohol dependence, uncomplicated: Secondary | ICD-10-CM
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
## 2017-11-06 ENCOUNTER — Encounter (HOSPITAL_COMMUNITY): Payer: Self-pay | Admitting: Medical

## 2017-11-06 ENCOUNTER — Encounter (HOSPITAL_COMMUNITY): Payer: Self-pay | Admitting: Psychology

## 2017-11-06 ENCOUNTER — Other Ambulatory Visit (HOSPITAL_COMMUNITY): Payer: No Typology Code available for payment source | Attending: Psychiatry | Admitting: Psychology

## 2017-11-06 VITALS — BP 105/70 | HR 55 | Ht 67.0 in | Wt 126.6 lb

## 2017-11-06 DIAGNOSIS — F1421 Cocaine dependence, in remission: Secondary | ICD-10-CM

## 2017-11-06 DIAGNOSIS — F1291 Cannabis use, unspecified, in remission: Secondary | ICD-10-CM

## 2017-11-06 DIAGNOSIS — Z87898 Personal history of other specified conditions: Secondary | ICD-10-CM

## 2017-11-06 DIAGNOSIS — Z79899 Other long term (current) drug therapy: Secondary | ICD-10-CM | POA: Insufficient documentation

## 2017-11-06 DIAGNOSIS — T7421XS Adult sexual abuse, confirmed, sequela: Secondary | ICD-10-CM

## 2017-11-06 DIAGNOSIS — Z811 Family history of alcohol abuse and dependence: Secondary | ICD-10-CM

## 2017-11-06 DIAGNOSIS — F1020 Alcohol dependence, uncomplicated: Secondary | ICD-10-CM

## 2017-11-06 DIAGNOSIS — Z62810 Personal history of physical and sexual abuse in childhood: Secondary | ICD-10-CM

## 2017-11-06 DIAGNOSIS — Z6372 Alcoholism and drug addiction in family: Secondary | ICD-10-CM

## 2017-11-06 DIAGNOSIS — F4312 Post-traumatic stress disorder, chronic: Secondary | ICD-10-CM

## 2017-11-06 DIAGNOSIS — F341 Dysthymic disorder: Secondary | ICD-10-CM

## 2017-11-06 DIAGNOSIS — F1024 Alcohol dependence with alcohol-induced mood disorder: Secondary | ICD-10-CM

## 2017-11-06 DIAGNOSIS — F5104 Psychophysiologic insomnia: Secondary | ICD-10-CM

## 2017-11-06 DIAGNOSIS — X58XXXS Exposure to other specified factors, sequela: Secondary | ICD-10-CM | POA: Insufficient documentation

## 2017-11-06 DIAGNOSIS — T7421XA Adult sexual abuse, confirmed, initial encounter: Secondary | ICD-10-CM | POA: Insufficient documentation

## 2017-11-06 DIAGNOSIS — F1290 Cannabis use, unspecified, uncomplicated: Secondary | ICD-10-CM

## 2017-11-06 DIAGNOSIS — D520 Dietary folate deficiency anemia: Secondary | ICD-10-CM

## 2017-11-06 DIAGNOSIS — F419 Anxiety disorder, unspecified: Secondary | ICD-10-CM

## 2017-11-06 MED ORDER — HYDROXYZINE HCL 50 MG PO TABS: 50.0000 mg | ORAL_TABLET | ORAL | 1 refills | Status: AC | PRN

## 2017-11-06 MED ORDER — PROPRANOLOL HCL 10 MG PO TABS: 10.0000 mg | ORAL_TABLET | Freq: Three times a day (TID) | ORAL | 1 refills | Status: DC

## 2017-11-06 MED ORDER — TRAZODONE HCL 100 MG PO TABS: 100.0000 mg | ORAL_TABLET | Freq: Every evening | ORAL | 1 refills | Status: DC | PRN

## 2017-11-06 MED ORDER — GABAPENTIN 300 MG PO CAPS: 300.0000 mg | ORAL_CAPSULE | Freq: Three times a day (TID) | ORAL | 1 refills | Status: DC

## 2017-11-06 MED ORDER — DULOXETINE HCL 60 MG PO CPEP: 120.0000 mg | ORAL_CAPSULE | Freq: Every day | ORAL | 1 refills | Status: DC
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
## 2017-11-07 ENCOUNTER — Other Ambulatory Visit (HOSPITAL_BASED_OUTPATIENT_CLINIC_OR_DEPARTMENT_OTHER): Payer: No Typology Code available for payment source | Admitting: Psychology

## 2017-11-07 DIAGNOSIS — F4312 Post-traumatic stress disorder, chronic: Secondary | ICD-10-CM

## 2017-11-07 DIAGNOSIS — F1020 Alcohol dependence, uncomplicated: Secondary | ICD-10-CM

## 2017-11-07 MED FILL — traZODone HCL 100 MG TABS: 100 | 90 days supply | Qty: 90 | Fill #0

## 2017-11-07 MED FILL — PROPRANOLOL 10 MG TABLET: 10 | 90 days supply | Qty: 270 | Fill #0

## 2017-11-07 MED FILL — DULoxetine HCL 60 MG CPEP: 60 | 90 days supply | Qty: 180 | Fill #0

## 2017-11-07 MED FILL — hydrOXYzine HCL 50 MG TABS: 50 | 15 days supply | Qty: 90 | Fill #0

## 2017-11-07 MED FILL — GABAPENTIN 300 MG CAPSULE: 300 | 30 days supply | Qty: 90 | Fill #0
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
## 2017-11-08 ENCOUNTER — Encounter (HOSPITAL_COMMUNITY): Payer: Self-pay

## 2017-11-08 NOTE — Progress Notes (Signed)
    Daily Group Progress Note  Program: CD-IOP   11/08/2017 Tyrone AppleAlexa Jule SerM [place] [id number]  Diagnosis:  Alcohol use disorder, severe, dependence (HCC)  Chronic post-traumatic stress disorder (PTSD)   Sobriety Date: 6/18  Group Time: 1-2:30  Participation Level: Active  Behavioral Response: Appropriate and Sharing  Type of Therapy: Process Group  Interventions: CBT, Strength-based and Supportive  Topic: Pts were active and engaged in group processing session. Counselor facilitated CBT-based processing questions to help pts discuss their recovery from mind-altering drugs/alcohol. Emphasis was placed on pts disclosing their challenges and successes pertaining to their treatment plan goals. UDS Results were  returned to some members.      Group Time: 2:30-4  Participation Level: Active  Behavioral Response: Appropriate and Sharing  Type of Therapy: Psycho-education Group  Interventions: CBT, Psychosocial Skills: Resentments and Anger Management Training  Topic: Patients were active and engaged in second discussion on "resentments in addicition". Pts were asked to share about their understanding of resentment and how it impacts their desire to use drugs/alcohol. Pts were guided on the difference between "acceptance of the past and approval of the past". Pts shared about their homework- a handout on resentment w/ blanks to fill in their experience.    Summary: Pt was active and engaged in group. She presented as open and sharing when asked multiple questions by other group members about her situation w/ her husband and her previous jobs. She attended 1 AA meeting this morning. She disclosed that she has had recovery in the past and is feeling "hopeful she can get it again" despite her husband not wanting to trust her again, currently. She disclosed that her husband is a substance abuse counselor who has 30 yrs of sobriety and "this is hard for him to understand how I keep  relapsing". Pt connected w/ another group member who was sharing about wanting to have extreme boundaries w/ others and "compartmentalizing all his relationships so as they do not interact w/ each other" and that she feels like a "chameleon". Pt prays daily for her HP to "rid her of the thoughts that she can control her drinking again". Pt admitted it feels "unnatural for her to have people she relies on but knows that it is good for her to open up to people so she allows herself to do it w/ a few in AA".    UDS collected: No Results: 7/24 results- negative  AA/NA attended?: YesThursday  Sponsor?: No   [place] Ambrosio, LPCA LCASA 11/08/2017 1:14 PM

## 2017-11-11 ENCOUNTER — Encounter: Payer: Self-pay | Admitting: Medical

## 2017-11-11 ENCOUNTER — Other Ambulatory Visit (HOSPITAL_BASED_OUTPATIENT_CLINIC_OR_DEPARTMENT_OTHER): Payer: No Typology Code available for payment source | Admitting: Psychology

## 2017-11-11 DIAGNOSIS — F1020 Alcohol dependence, uncomplicated: Secondary | ICD-10-CM

## 2017-11-11 DIAGNOSIS — F4312 Post-traumatic stress disorder, chronic: Secondary | ICD-10-CM

## 2017-11-13 ENCOUNTER — Encounter (HOSPITAL_COMMUNITY): Payer: Self-pay | Admitting: Medical

## 2017-11-13 ENCOUNTER — Other Ambulatory Visit (INDEPENDENT_AMBULATORY_CARE_PROVIDER_SITE_OTHER): Payer: No Typology Code available for payment source | Admitting: Psychology

## 2017-11-13 DIAGNOSIS — D520 Dietary folate deficiency anemia: Secondary | ICD-10-CM

## 2017-11-13 DIAGNOSIS — Z62810 Personal history of physical and sexual abuse in childhood: Secondary | ICD-10-CM

## 2017-11-13 DIAGNOSIS — F341 Dysthymic disorder: Secondary | ICD-10-CM

## 2017-11-13 DIAGNOSIS — F419 Anxiety disorder, unspecified: Secondary | ICD-10-CM

## 2017-11-13 DIAGNOSIS — Z6372 Alcoholism and drug addiction in family: Secondary | ICD-10-CM

## 2017-11-13 DIAGNOSIS — Z811 Family history of alcohol abuse and dependence: Secondary | ICD-10-CM

## 2017-11-13 DIAGNOSIS — F5104 Psychophysiologic insomnia: Secondary | ICD-10-CM

## 2017-11-13 DIAGNOSIS — F1020 Alcohol dependence, uncomplicated: Secondary | ICD-10-CM

## 2017-11-13 DIAGNOSIS — F4312 Post-traumatic stress disorder, chronic: Secondary | ICD-10-CM

## 2017-11-13 DIAGNOSIS — T7421XS Adult sexual abuse, confirmed, sequela: Secondary | ICD-10-CM
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
## 2017-11-14 ENCOUNTER — Other Ambulatory Visit (HOSPITAL_COMMUNITY): Payer: No Typology Code available for payment source | Attending: Psychiatry | Admitting: Psychology

## 2017-11-14 ENCOUNTER — Encounter (HOSPITAL_COMMUNITY): Payer: Self-pay

## 2017-11-14 DIAGNOSIS — F341 Dysthymic disorder: Secondary | ICD-10-CM | POA: Insufficient documentation

## 2017-11-14 DIAGNOSIS — F4312 Post-traumatic stress disorder, chronic: Secondary | ICD-10-CM

## 2017-11-14 DIAGNOSIS — Z62810 Personal history of physical and sexual abuse in childhood: Secondary | ICD-10-CM | POA: Insufficient documentation

## 2017-11-14 DIAGNOSIS — F1020 Alcohol dependence, uncomplicated: Secondary | ICD-10-CM

## 2017-11-14 DIAGNOSIS — Z79899 Other long term (current) drug therapy: Secondary | ICD-10-CM | POA: Insufficient documentation

## 2017-11-14 DIAGNOSIS — Z811 Family history of alcohol abuse and dependence: Secondary | ICD-10-CM | POA: Insufficient documentation

## 2017-11-14 DIAGNOSIS — D539 Nutritional anemia, unspecified: Secondary | ICD-10-CM | POA: Insufficient documentation

## 2017-11-14 DIAGNOSIS — F1421 Cocaine dependence, in remission: Secondary | ICD-10-CM | POA: Insufficient documentation

## 2017-11-14 DIAGNOSIS — G4700 Insomnia, unspecified: Secondary | ICD-10-CM | POA: Insufficient documentation

## 2017-11-14 DIAGNOSIS — F419 Anxiety disorder, unspecified: Secondary | ICD-10-CM | POA: Insufficient documentation

## 2017-11-14 DIAGNOSIS — T7421XS Adult sexual abuse, confirmed, sequela: Secondary | ICD-10-CM | POA: Insufficient documentation

## 2017-11-14 DIAGNOSIS — F1290 Cannabis use, unspecified, uncomplicated: Secondary | ICD-10-CM | POA: Insufficient documentation
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
## 2017-11-18 ENCOUNTER — Encounter (HOSPITAL_COMMUNITY): Payer: Self-pay | Admitting: Psychology

## 2017-11-18 ENCOUNTER — Other Ambulatory Visit (HOSPITAL_BASED_OUTPATIENT_CLINIC_OR_DEPARTMENT_OTHER): Payer: No Typology Code available for payment source | Admitting: Psychology

## 2017-11-18 DIAGNOSIS — F4312 Post-traumatic stress disorder, chronic: Secondary | ICD-10-CM

## 2017-11-18 DIAGNOSIS — F1020 Alcohol dependence, uncomplicated: Secondary | ICD-10-CM

## 2017-11-18 DIAGNOSIS — F419 Anxiety disorder, unspecified: Secondary | ICD-10-CM

## 2017-11-18 NOTE — Progress Notes (Signed)
    Daily Group Progress Note  Program: CD-IOP   11/18/2017 Dawn Jackson [id number]  Diagnosis: F10.20 Alcohol Use Disorder, Severe  Sobriety Date: 6/18  Group Time: 1-2:30pm  Participation Level: Active  Behavioral Response: Sharing  Type of Therapy: Process Group  Interventions: Supportive  Topic: Process: The first half of group was spent in process. Members shared about any challenges or struggles in early recovery along with any shining moments or successes. Members shared about any reflections or insights they had gained since we met yesterday and completed the Wheel of Life.   Group Time: 2:30-4pm  Participation Level: Active  Behavioral Response: Appropriate  Type of Therapy: Psycho-education Group  Interventions: Psychosocial Skills: Self Care  Topic: Self-Care: The second half of group was spent in a psycho-ed on Self-Care. Group members shared about what they do to take care of themselves and some continued to identify what they were feeling after completing the Wheel of Life exercise in yesterdays' group session. Having a sense of purpose in life was discussed at length.   Summary: The patient reported her husband is 'getting on my nerves'. She explained that he is angry and "I just listen". She reported plans to attend a 'retreat' this weekend. she and ten other ladies are going up to a center in [place] for the weekend. the counselor asked the patient 'what would cause you to drink?' She reported it would be an internal feeling; the thought that I could have just one". She also admitted that 'shame is an issue for me'. In the psycho-ed, the patient reported she had a sense of meaning by being a mother. The patient identified her shortcoming is 'the emotional' and wondered 'how do I work on emotional growth?' A group member suggested the [place] and another member passed her the sheet. Her counselor explained, "You can become more emotionally aware and  connected by learning to identify what you are feeling. That is a beginning". She studied the wheel and identified that she felt 'worried and abandoned". The patient begin to understand what this would entail, but seemed willing and ready. She responded well to this intervention.   UDS collected: No  AA/NA attended?: YesWednesday and Thursday  Sponsor?: Yes   [name], LCAS 11/18/2017 8:43 AM

## 2017-11-19 ENCOUNTER — Encounter (HOSPITAL_COMMUNITY): Payer: Self-pay | Admitting: Licensed Clinical Social Worker

## 2017-11-19 ENCOUNTER — Encounter (HOSPITAL_COMMUNITY): Payer: Self-pay | Admitting: Psychology

## 2017-11-19 DIAGNOSIS — F419 Anxiety disorder, unspecified: Secondary | ICD-10-CM

## 2017-11-19 DIAGNOSIS — F4312 Post-traumatic stress disorder, chronic: Secondary | ICD-10-CM

## 2017-11-19 DIAGNOSIS — F1020 Alcohol dependence, uncomplicated: Secondary | ICD-10-CM
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
## 2017-11-19 NOTE — Progress Notes (Signed)
   THERAPIST PROGRESS NOTE  Session Time: 11-12  Participation Level: Active  Behavioral Response: Neat and Well GroomedAlertEuthymic  Type of Therapy: Individual Therapy  Treatment Goals addressed: Anxiety and Diagnosis: SUD  Interventions: CBT, Strength-based, Supportive and Reframing  Summary: Dawn Jackson is a 47 y.o. female who presents with hx of PTSD and Alcohol Use Disorder, Severe. She is active and engaged in her weekly individual appointment for CD-IOP. 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 Counselor and pt discuss the topic of "what does pt need to be working on to stay sober, currently?". Pt admits she feels "overall very capable of staying sober but admits that she still has a "small voice that tells her she can have just one drink". Pt spends time trying to gain insight into what the drink of alcohol "would provide for her". Pt has extensive skills in "playing tape to end", distracting herself, and calling sponsor when she has these thoughts. Counselor spends time asking about pt's spiritual life and how her HP helps pt stay sober. Counselor encourages pt to consider asking God to "show her how to stay sober rather than simply asking to take a thought away". Counselor encourages pt to consider bxs that can replace the thoughts to drink. Pt gained insight into her long-term "father issues" and wanting her father's approval of her life. She admitted she wishes her father believed she was competent.   Suicidal/Homicidal: Nowithout intent/plan  Therapist Response: Counselor used open questions, active listening, coping skill building, relapse prevention discussion and planning.  Plan: Continue in CD-IOP for 6 weeks.  Diagnosis:    ICD-10-CM   1. Alcohol use disorder, severe, dependence (HCC) F10.20   2. Chronic post-traumatic stress disorder (PTSD) F43.12   3. Chronic  anxiety F41.9       Margo CommonWesley E Marvie Calender, LCAS-A 11/19/2017

## 2017-11-20 ENCOUNTER — Other Ambulatory Visit (HOSPITAL_COMMUNITY): Payer: No Typology Code available for payment source | Admitting: Psychology

## 2017-11-20 DIAGNOSIS — F1020 Alcohol dependence, uncomplicated: Secondary | ICD-10-CM

## 2017-11-20 DIAGNOSIS — F4312 Post-traumatic stress disorder, chronic: Secondary | ICD-10-CM

## 2017-11-21 ENCOUNTER — Other Ambulatory Visit (HOSPITAL_BASED_OUTPATIENT_CLINIC_OR_DEPARTMENT_OTHER): Payer: No Typology Code available for payment source | Admitting: Psychology

## 2017-11-21 DIAGNOSIS — F1020 Alcohol dependence, uncomplicated: Secondary | ICD-10-CM

## 2017-11-21 DIAGNOSIS — F4312 Post-traumatic stress disorder, chronic: Secondary | ICD-10-CM

## 2017-11-22 ENCOUNTER — Encounter (HOSPITAL_COMMUNITY): Payer: Self-pay | Admitting: Psychology

## 2017-11-22 NOTE — Progress Notes (Signed)
    Daily Group Progress Note  Program: CD-IOP   11/22/2017 Dawn Jackson [id number]  Diagnosis: alcohol Use disorder, severe  Sobriety Date: 6/18  Group Time: 1-2:30pm  Participation Level: Active  Behavioral Response: Appropriate, Sharing and Intrusive  Type of Therapy: Process Group  Interventions: Supportive  Topic: Process: the first half of group was spent in process. Group members shared about the past few days and any shining moments or speed bumps in their recovery. The medical director met with a few group members for med checks. Three drug tests were collected.  Group Time: 2:30-4pm  Participation Level: Active  Behavioral Response: Sharing  Type of Therapy: Happy Birthday/Psychoeducation  Interventions: Family Systems  Topic: Psycho-Education: Family Roles. The second half of group began with cupcakes and the group singing 'Happy Birthday' to a member who was celebrating her birthday today. She had a candle and blew it out after making a wish. Following this brief celebration, a handout was passed around. It identified the different roles often times appearing in dysfunctional family systems and the characteristics of each one. Members shared about their own childhoods and almost all were able to identify what role or roles they might have taken on in their family. It proved very insightful for some who were not familiar with this topic.   Summary: The patient reported she was having a 'great day'. It was her birthday and she had attended an [place] meeting this morning. She admitted that she is going out for dinner with her father and step-mother and she isn't looking forward to that. The patient shared they had chosen to go to a restaurant where the bar was not prevalent. She had wanted to go elsewhere. The patient provided interesting feedback to another member who was asking what 'Serenity' felt like? She wasn't sure if she didn't confuse it with boredom because it  certainly doesn't include chaos. As the psycho-ed began, the group sang "happy birthday' and had cupcakes for the patient's birthday. She blew out a candle and thanked the group for the celebration. As the family roles progressed, she identified herself as having been the 'mascot' at first, but as time has passed, "I've become the scapegoat".The patient noted that none of her siblings have had the problems she has had. The patient admitted it is painful, especially when her father tells her "I don't think you can stop drinking". The patient has had numerous previous treatment episodes and was very familiar with the various roles in dysfunctional families. She provided supportive feedback and responded well to this intervention.    UDS collected: No   AA/NA attended?: YesTuesday and Wednesday  Sponsor?: Yes   [name], LCAS 11/22/2017 11:11 AM

## 2017-11-25 ENCOUNTER — Other Ambulatory Visit (HOSPITAL_BASED_OUTPATIENT_CLINIC_OR_DEPARTMENT_OTHER): Payer: No Typology Code available for payment source | Admitting: Psychology

## 2017-11-25 DIAGNOSIS — F4312 Post-traumatic stress disorder, chronic: Secondary | ICD-10-CM

## 2017-11-25 DIAGNOSIS — F1020 Alcohol dependence, uncomplicated: Secondary | ICD-10-CM

## 2017-11-27 ENCOUNTER — Ambulatory Visit (HOSPITAL_COMMUNITY)
Admission: EM | Admit: 2017-11-27 | Discharge: 2017-11-27 | Disposition: A | Discharge status: Home / Self Care | Payer: Self-pay | Attending: Family Medicine | Admitting: Family Medicine

## 2017-11-27 ENCOUNTER — Encounter (HOSPITAL_COMMUNITY): Payer: Self-pay | Admitting: Emergency Medicine

## 2017-11-27 ENCOUNTER — Other Ambulatory Visit (INDEPENDENT_AMBULATORY_CARE_PROVIDER_SITE_OTHER): Payer: No Typology Code available for payment source | Admitting: Psychology

## 2017-11-27 ENCOUNTER — Encounter (HOSPITAL_COMMUNITY): Payer: Self-pay | Admitting: Medical

## 2017-11-27 DIAGNOSIS — F1291 Cannabis use, unspecified, in remission: Secondary | ICD-10-CM

## 2017-11-27 DIAGNOSIS — Z811 Family history of alcohol abuse and dependence: Secondary | ICD-10-CM

## 2017-11-27 DIAGNOSIS — Z62810 Personal history of physical and sexual abuse in childhood: Secondary | ICD-10-CM

## 2017-11-27 DIAGNOSIS — F4312 Post-traumatic stress disorder, chronic: Secondary | ICD-10-CM

## 2017-11-27 DIAGNOSIS — F1290 Cannabis use, unspecified, uncomplicated: Secondary | ICD-10-CM

## 2017-11-27 DIAGNOSIS — F419 Anxiety disorder, unspecified: Secondary | ICD-10-CM

## 2017-11-27 DIAGNOSIS — F341 Dysthymic disorder: Secondary | ICD-10-CM

## 2017-11-27 DIAGNOSIS — T7421XS Adult sexual abuse, confirmed, sequela: Secondary | ICD-10-CM

## 2017-11-27 DIAGNOSIS — S46811A Strain of other muscles, fascia and tendons at shoulder and upper arm level, right arm, initial encounter: Secondary | ICD-10-CM

## 2017-11-27 DIAGNOSIS — T148XXA Other injury of unspecified body region, initial encounter: Secondary | ICD-10-CM

## 2017-11-27 DIAGNOSIS — Z6372 Alcoholism and drug addiction in family: Secondary | ICD-10-CM

## 2017-11-27 DIAGNOSIS — M5489 Other dorsalgia: Secondary | ICD-10-CM

## 2017-11-27 DIAGNOSIS — F1421 Cocaine dependence, in remission: Secondary | ICD-10-CM

## 2017-11-27 DIAGNOSIS — Z87898 Personal history of other specified conditions: Secondary | ICD-10-CM

## 2017-11-27 DIAGNOSIS — D520 Dietary folate deficiency anemia: Secondary | ICD-10-CM

## 2017-11-27 DIAGNOSIS — F1020 Alcohol dependence, uncomplicated: Secondary | ICD-10-CM

## 2017-11-27 DIAGNOSIS — F5104 Psychophysiologic insomnia: Secondary | ICD-10-CM

## 2017-11-27 MED ORDER — CYCLOBENZAPRINE HCL 10 MG PO TABS: 10.0000 mg | ORAL_TABLET | Freq: Two times a day (BID) | ORAL | 0 refills | Status: DC | PRN
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
## 2017-11-27 NOTE — ED Triage Notes (Signed)
Pt states she tweated her back Sunday trying to start the lawnmower. "feels like something is pinched." Pt states shes done this before to her back and needs a muscle relaxer.
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
## 2017-11-27 NOTE — Discharge Instructions (Addendum)
It was nice meeting you!!  We will give you a muscle relaxer to help with the muscle strain/spasm in your back.  Be aware this medication will make you drowsy.  Follow up as need.

## 2017-11-28 ENCOUNTER — Encounter (HOSPITAL_COMMUNITY): Payer: Self-pay | Admitting: Psychology

## 2017-11-28 ENCOUNTER — Other Ambulatory Visit (HOSPITAL_COMMUNITY): Payer: No Typology Code available for payment source | Admitting: Psychology

## 2017-11-28 DIAGNOSIS — F4312 Post-traumatic stress disorder, chronic: Secondary | ICD-10-CM

## 2017-11-28 DIAGNOSIS — F1020 Alcohol dependence, uncomplicated: Secondary | ICD-10-CM
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
## 2017-11-28 NOTE — Progress Notes (Signed)
    Daily Group Progress Note  Program: CD-IOP   11/28/2017 Tyrone AppleAlexa Jule SerM Kuzniar [id number]  Diagnosis:  Alcohol use disorder, severe, dependence (HCC)  Chronic post-traumatic stress disorder (PTSD)   Sobriety Date: 6/18  Group Time: 1-2:30  Participation Level: Active  Behavioral Response: Appropriate and Sharing  Type of Therapy: Process Group  Interventions: CBT  Topic: Pts were active and engaged in group processing session. Counselor facilitated CBT-based processing questions to help pts discuss their recovery from mind-altering drugs/alcohol. Emphasis was placed on pts disclosing their challenges and successes pertaining to their treatment plan goals. UDS Results were collected and returned to some members. One member graduated successfully and her sister was present for final 15 min of session to congratulate her. One new member was present for group and shared extensively about his shame and family hx of addiction.      Group Time: 2:30-4  Participation Level: Active  Behavioral Response: Appropriate and Sharing  Type of Therapy: Psycho-education Group  Interventions: CBT  Topic: Patients were active and engaged in a talk provided by a former group member who has sustained nearly 1 year of sobriety. This former pt spoke on changes she has made in her life and coping skills for maintaining recovery after graduating from CD-IOP last year.    Summary: Pt was active and engaged in group. She attended 1 AA meetings since yesterday at her regularly morning meeting. She expressed gratitude and shock at her birthday dinner last night in which her father took her out and stated "he was wrong" about not giving her a car when she asked a few weeks ago. Pt stated her father has never admitted fault to her directly and this is a sign that her relationship w/ him may be changing for the better. Pt received flowers from her husband who lives in another state for now due to work  conflicts. Pt provided helpful feedback to another member who was struggling w/ codependent bsx towards his wife. Pt continues to attend AA regularly, talk w/ sponsor regularly, and is gaining insight into her need for more emotional awareness and displaying her affect more comfortably.    UDS collected: No Results: negative from 8/5  AA/NA attended?: YesThursday  Sponsor?: Yes  Dorann LodgeWes Ambrose Wile, LPCA LCASA 11/28/2017 10:15 AM

## 2017-12-02 ENCOUNTER — Encounter (HOSPITAL_COMMUNITY): Payer: Self-pay | Admitting: Psychology

## 2017-12-02 ENCOUNTER — Other Ambulatory Visit (HOSPITAL_COMMUNITY): Payer: No Typology Code available for payment source

## 2017-12-02 NOTE — Progress Notes (Signed)
    Daily Group Progress Note  Program: CD-IOP   12/02/2017 -Marie M Roat [id number]  Diagnosis: cocaine use disorder  Sobriety Date: 6/18  Group Time: 1-2:30pm  Participation Level: Active  Behavioral Response: Appropriate  Type of Therapy: Process Group  Interventions: Supportive  Topic: Process: the group began with a brief check-in: name sobriety date and how many meetings each member had attended since we last met. Since the last group was yesterday, afternoon not everyone had gone to a meeting. One member returned after being absent yesterday and a drug test was collected from him.   Group Time: 2:30-4pm  Participation Level: Active  Behavioral Response: Sharing  Type of Therapy: Psycho-education Group  Interventions: Family Systems  Topic: Psycho-Education: Family Sculpture, Part 2. The second half of group was a continuation of the Family Sculpture that was introduced yesterday in the psycho-ed. The sculptures varied considerably from seemingly happy engaged family to distant, cold and frequently abusive ones. Members shared their feelings as they observed the sculpture of their family and what this 'still life' brought up for them. A few proved instructive in allowing members to identify something different from what they had remembered in childhood.    Summary: The patient reported she had attended two AA meetings since we last met. She noted that she was feeling a little irritable and anxious about leaving tomorrow morning. "It is a change in my routine", and I don't do well under those circumstances. She reported her plane is scheduled to depart at 6 am and her father is taking her to the airport. She will be in [place], [place] for a week with her husband. The group expressed concerns about her and the awkwardness and difficulty of being in the same city where her daughter lives - whom she hasn't seen in 4+ years. The patient reported she already knows which meetings she  will attend and looks forward to being with her husband. In the psycho-ed, the patient sculpted her family. The parents had a child between themselves and the patient pointed this out. She seemed to be the 'scapegoat' and reminded the group she was the youngest. The sculpture invited the patient to share more about her parents, especially her mother, who died 15 years ago. They had been very close. The patient received good wishes from the entire group and they wished her safe travels and a good trip. She expressed her appreciation and reported she would return on the next Monday. she made some insightful comments and responded well to this intervention. The patient will be excused from the next three group sessions. She had made these travel plans and airplane reservations well before she began our program. Seeing her husband will be therapeutic for her.   UDS collected: No   AA/NA attended?: YesWednesday and Thursday  Sponsor?: Yes    , LCAS 12/02/2017 10:03 AM 

## 2017-12-04 ENCOUNTER — Encounter (HOSPITAL_COMMUNITY): Payer: Self-pay

## 2017-12-04 ENCOUNTER — Other Ambulatory Visit (HOSPITAL_COMMUNITY): Payer: No Typology Code available for payment source

## 2017-12-04 NOTE — Progress Notes (Signed)
Patient ID: Dawn Jackson [place], female   DOB: 09/20/1970, 47 y.o.   MRN: [id number]     Daily Group Progress Note  Program: CD-IOP   12/04/2017 Dawn Jackson Dimitrov [id number]  Diagnosis:  Alcohol use disorder, severe, dependence (HCC)  Chronic post-traumatic stress disorder (PTSD)   Sobriety Date: 6/18  Group Time: 1-2:30  Participation Level: Active  Behavioral Response: Appropriate and Sharing  Type of Therapy: Process Group  Interventions: CBT, Solution Focused and Supportive  Topic: Pts were active and engaged in group processing session. Counselor facilitated CBT-based processing questions to help pts discuss their recovery from mind-altering drugs/alcohol. Emphasis was placed on pts disclosing their challenges and successes pertaining to their treatment plan goals. UDS Results were collected and returned to some members.       Group Time: 2:30-4  Participation Level: Active  Behavioral Response: Appropriate and Sharing  Type of Therapy: Psycho-education Group  Interventions: CBT and Meditation: Mindfulness of emotions  Topic:  Patients were active and engaged in a brief mindfulness exercise aimed at gaining insight into their feelings state and growing their emotional vocabulary.    Summary: "I had a good weekend... I slept a lot... but I didn't think about drinking at all... Me and God are getting close." Pt was active, engaged, made strong eye contact, was agitated in session. She reported she felt angry this weekend from having a hive-like reaction to strawberries and struggling to get her lawn mower started on Sunday. She reported she is feeling more connected to her HP and her prayer to 'not think about drinking' is evolving into a more developed sense of gratitude for sobriety. Pt stated she took 3 naps on Sunday and is unaware as to why this is. Pt attended 5 AA meetings since last group. She communicated her discomfort over a "cross-talk incident" at a meeting and  felt proud to share her feelings in the moment w/ a group.   UDS collected: Yes Results: negative  AA/NA attended?: YesThursday, Friday, Saturday and Sunday  Sponsor?: Yes   Dorann LodgeWes Jarmon Javid, LPCA LCASA 12/04/2017 11:34 AM

## 2017-12-05 ENCOUNTER — Other Ambulatory Visit (HOSPITAL_COMMUNITY): Payer: No Typology Code available for payment source

## 2017-12-09 ENCOUNTER — Encounter (HOSPITAL_COMMUNITY): Payer: Self-pay

## 2017-12-09 ENCOUNTER — Other Ambulatory Visit (HOSPITAL_BASED_OUTPATIENT_CLINIC_OR_DEPARTMENT_OTHER): Payer: No Typology Code available for payment source | Admitting: Psychology

## 2017-12-09 DIAGNOSIS — F1020 Alcohol dependence, uncomplicated: Secondary | ICD-10-CM

## 2017-12-09 DIAGNOSIS — F419 Anxiety disorder, unspecified: Secondary | ICD-10-CM

## 2017-12-09 DIAGNOSIS — F4312 Post-traumatic stress disorder, chronic: Secondary | ICD-10-CM

## 2017-12-09 NOTE — Progress Notes (Signed)
    Daily Group Progress Note  Program: CD-IOP   12/09/2017 Tyrone AppleAlexa Jule SerM Shorty [id number]  Diagnosis:  Alcohol use disorder, severe, dependence (HCC)  Chronic post-traumatic stress disorder (PTSD)  Chronic anxiety   Sobriety Date: 6/18  Group Time: 1-2:30  Participation Level: Active  Behavioral Response: Appropriate and Sharing  Type of Therapy: Process Group  Interventions: CBT  Topic: Pts were active and engaged in group processing session. Counselor facilitated CBT-based processing questions to help pts discuss their recovery from mind-altering drugs/alcohol. Emphasis was placed on pts disclosing their challenges and successes pertaining to their treatment plan goals. UDS Results were collected and returned to some members. One new member was present and talked about her alcohol addiction and issues of unhealthy marriage. One member graduated and other members celebrated him in final 15min of session. One member returned after a week long trip.      Group Time: 2:30-4  Participation Level: Active  Behavioral Response: Appropriate and Sharing  Type of Therapy: Psycho-education Group  Interventions: Family Systems  Topic: Patient were active and engaged in psychoeducation session which followed up on a homework assignment. Pts were instructed to write an "I Am From" poem using the provided handout. Pts shared their poems and discussed their family hx in childhood.     Summary: Subjective: "I had a wonderful trip w/ my husband. I had an epiphany that I can't control my drinking... it really hit home for me this time."  Pt made strong eye contact, spoke clearly and assertively, and smiled throughout session. She reported attended at least 3 AA meetings since returning from [place] last Friday. She stated her trip to [place] was "wonderful" and she felt like it was a "honeymoon". She reconnected w/ her husband. She admitted she was triggered slightly on the plane ride home when she  was offered a complementary cocktail due to a airline error. Pt politely refused drink and did not hesitate, however she admitted she thought about the offer later in the trip home. She picked up her 2 mo chip and talked w/ her sponsor while in [place]. Pt reported happily that her friends and husband are noticing a physical and psychological difference in her presentation, even though she has had longer periods of sobriety in the past. A UDS was collected.   UDS collected: Yes Results: Pending- most recent from 8/14 was negative.  AA/NA attended?: YesThursday, Friday and Saturday  Sponsor?: Yes   Dorann LodgeWes Cordelle Dahmen, LPCA LCASA 12/09/2017 4:36 PM

## 2017-12-11 ENCOUNTER — Encounter (HOSPITAL_COMMUNITY): Payer: Self-pay | Admitting: Medical

## 2017-12-11 ENCOUNTER — Other Ambulatory Visit (HOSPITAL_COMMUNITY): Payer: Self-pay

## 2017-12-11 ENCOUNTER — Other Ambulatory Visit (INDEPENDENT_AMBULATORY_CARE_PROVIDER_SITE_OTHER): Payer: No Typology Code available for payment source | Admitting: Psychology

## 2017-12-11 DIAGNOSIS — Z6372 Alcoholism and drug addiction in family: Secondary | ICD-10-CM

## 2017-12-11 DIAGNOSIS — F419 Anxiety disorder, unspecified: Secondary | ICD-10-CM

## 2017-12-11 DIAGNOSIS — F1291 Cannabis use, unspecified, in remission: Secondary | ICD-10-CM

## 2017-12-11 DIAGNOSIS — F1421 Cocaine dependence, in remission: Secondary | ICD-10-CM

## 2017-12-11 DIAGNOSIS — T7421XS Adult sexual abuse, confirmed, sequela: Secondary | ICD-10-CM

## 2017-12-11 DIAGNOSIS — Z811 Family history of alcohol abuse and dependence: Secondary | ICD-10-CM

## 2017-12-11 DIAGNOSIS — F1020 Alcohol dependence, uncomplicated: Secondary | ICD-10-CM

## 2017-12-11 DIAGNOSIS — Z62810 Personal history of physical and sexual abuse in childhood: Secondary | ICD-10-CM

## 2017-12-11 DIAGNOSIS — Z87898 Personal history of other specified conditions: Secondary | ICD-10-CM

## 2017-12-11 DIAGNOSIS — F4312 Post-traumatic stress disorder, chronic: Secondary | ICD-10-CM

## 2017-12-11 DIAGNOSIS — F1290 Cannabis use, unspecified, uncomplicated: Secondary | ICD-10-CM

## 2017-12-11 MED ORDER — GABAPENTIN 300 MG PO CAPS: 300.0000 mg | ORAL_CAPSULE | Freq: Three times a day (TID) | ORAL | 1 refills | Status: DC
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
## 2017-12-12 ENCOUNTER — Other Ambulatory Visit (HOSPITAL_BASED_OUTPATIENT_CLINIC_OR_DEPARTMENT_OTHER): Payer: No Typology Code available for payment source | Admitting: Psychology

## 2017-12-12 DIAGNOSIS — F419 Anxiety disorder, unspecified: Secondary | ICD-10-CM

## 2017-12-12 DIAGNOSIS — F1020 Alcohol dependence, uncomplicated: Secondary | ICD-10-CM
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
## 2017-12-17 NOTE — Addendum Note (Signed)
Addended by: Dimas Chyle E on: 12/17/2017 03:32 PM   Modules accepted: Level of Service

## 2017-12-18 ENCOUNTER — Encounter (HOSPITAL_COMMUNITY): Payer: Self-pay | Admitting: Psychology

## 2017-12-18 ENCOUNTER — Other Ambulatory Visit (HOSPITAL_COMMUNITY): Payer: No Typology Code available for payment source | Attending: Psychiatry | Admitting: Psychology

## 2017-12-18 DIAGNOSIS — F341 Dysthymic disorder: Secondary | ICD-10-CM | POA: Insufficient documentation

## 2017-12-18 DIAGNOSIS — Z79899 Other long term (current) drug therapy: Secondary | ICD-10-CM | POA: Insufficient documentation

## 2017-12-18 DIAGNOSIS — F419 Anxiety disorder, unspecified: Secondary | ICD-10-CM

## 2017-12-18 DIAGNOSIS — F4312 Post-traumatic stress disorder, chronic: Secondary | ICD-10-CM | POA: Insufficient documentation

## 2017-12-18 DIAGNOSIS — F1020 Alcohol dependence, uncomplicated: Secondary | ICD-10-CM | POA: Insufficient documentation

## 2017-12-18 DIAGNOSIS — Z62810 Personal history of physical and sexual abuse in childhood: Secondary | ICD-10-CM | POA: Insufficient documentation

## 2017-12-18 NOTE — Progress Notes (Signed)
    Daily Group Progress Note  Program: CD-IOP   12/18/2017 Dawn Jackson [id number]  Diagnosis:  Alcohol use disorder, severe, dependence (HCC)  Chronic anxiety   Sobriety Date: 6/18  Group Time: 1-2:30  Participation Level: Active  Behavioral Response: Appropriate and Sharing  Type of Therapy: Process Group  Interventions: CBT  Topic: Pts were active and engaged in group processing session. Counselor facilitated CBT-based processing questions to help pts discuss their recovery from mind-altering drugs/alcohol. Emphasis was placed on pts disclosing their challenges and successes pertaining to their treatment plan goals. One member arrived but stated he felt that he was sick and needed to go home. Counselor discussed this option w/ pt and clinical supervisor and was advised to allow pt to return home. Pts were provided w/ popsickle stick topics to discuss as a group.       Group Time: 2:30-4  Participation Level: Active  Behavioral Response: Appropriate and Sharing  Type of Therapy: Psycho-education Group  Interventions: CBT and Psychosocial Skills: Self Care  Topic: Patient were active and engaged in psychoeducation session which was led by Enriqueta Shutter, a [place] Educator. She discussed topics of ADL's including improving quality of sleep, diet, and exercise. Pts asks specific questions about their daily routines.    Summary: Subjective: "I'm not a very motivated person naturally, I have to work at that everyday."  Pt made strong eye contact, had open posture, and appeared comfortable in session. She reported attending 0 AA meetings since yesterday's group. She stated she plans to go to her family farm this holiday weekend and is bringing a friend in recovery. She discussed the topic of motivation and wisely stated that she does not have natural motivation currently so she is sticking to her plan to "learn to build up her motivation". Pt stated she also can not  count on her motivation so she follows her routine "just in case".  Pt briefly discussed her [name] which is a universal religion encouraging peace and positive deeds. Pt acknowledged her difficulties of tolerating nights and admitted she is more of a morning person.    UDS collected: No Results: negative  AA/NA attended?: No  Sponsor?: Yes  Wes Obera Stauch, LPCA LCASA 12/18/2017 2:30 PM

## 2017-12-19 ENCOUNTER — Other Ambulatory Visit (HOSPITAL_COMMUNITY): Payer: No Typology Code available for payment source | Admitting: Psychology

## 2017-12-19 DIAGNOSIS — F1020 Alcohol dependence, uncomplicated: Secondary | ICD-10-CM

## 2017-12-19 DIAGNOSIS — F4312 Post-traumatic stress disorder, chronic: Secondary | ICD-10-CM

## 2017-12-19 DIAGNOSIS — F419 Anxiety disorder, unspecified: Secondary | ICD-10-CM

## 2017-12-20 ENCOUNTER — Telehealth (HOSPITAL_COMMUNITY): Payer: Self-pay | Admitting: Psychology

## 2017-12-20 ENCOUNTER — Encounter (HOSPITAL_COMMUNITY): Payer: Self-pay | Admitting: Psychology
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
## 2017-12-22 ENCOUNTER — Encounter (HOSPITAL_COMMUNITY): Payer: Self-pay | Admitting: Psychology

## 2017-12-22 NOTE — Progress Notes (Signed)
    Daily Group Progress Note  Program: CD-IOP   12/22/2017 Dawn Jackson [id number]  Diagnosis: Alcohol Use Disorder, Severe  Sobriety Date: 6/18  Group Time: 1-2:30pm  Participation Level: Active  Behavioral Response: Sharing  Type of Therapy: Process Group  Interventions: Supportive  Topic: Process: The first half of group was spent in process. Members shared about their struggles as well as 'victories' in early recovery. A new group member was present, and he introduced himself to the group and explained how his cocaine use and recent psych hospitalization brought him to the program. The session was lively, and members questioned the new group member. His disclosures generated more conversation about others' experiences in early recovery and what had happened to them to compel them to seek treatment. A drug test was collected from the new group member.  Group Time: 2:30-4pm  Participation Level: Active  Behavioral Response: Sharing  Type of Therapy: Psycho-ED  Interventions: Graduation  Topic: Psycho-Ed: Graduation. The first part of group ran over time and left little time for the psychoeducation. A graduation was scheduled today and the graduating member had invited a friend who arrived around 3:30. The brownies were shared and the 'graduation medallion' was passed around with each member 'rubbing in something for him to take with him and rubbing out something for him to leave behind'. Kind words of admiration and encouragement were shared by group members and the graduating member appeared genuinely touched by these signs of care. It was a powerful session.   Summary: The patient reported she had attended her home group meeting this morning. Another group member had appeared, and it had been good to see him. She expressed frustration over her difficulty in securing employment. The patient reminded the group, "I have more common sense than average" and admitted she was  disappointed that [employer] had rescinded their offer after reviewing her legal history. She reported this had been kind of depressing. During the graduation ceremony, the patient expressed her hope that the graduating member would not go back out to his old life but remain committed and sober in this new one. She also invited him to return to her home group and reminded him that he had borrowed a book from [employer], and he had to bring it back. "It's belongs to the [place]". The patient continues to make good progress, but the frustration to secure some sort of employment could prove problematic in early recovery.   UDS collected: No  AA/NA attended? Yes, one on Thursday morning  Sponsor?: Yes   [name], LCAS 12/22/2017 12:26 PM

## 2017-12-23 ENCOUNTER — Other Ambulatory Visit (HOSPITAL_BASED_OUTPATIENT_CLINIC_OR_DEPARTMENT_OTHER): Payer: No Typology Code available for payment source | Admitting: Psychology

## 2017-12-23 DIAGNOSIS — F1020 Alcohol dependence, uncomplicated: Secondary | ICD-10-CM

## 2017-12-23 DIAGNOSIS — F4312 Post-traumatic stress disorder, chronic: Secondary | ICD-10-CM

## 2017-12-23 DIAGNOSIS — F419 Anxiety disorder, unspecified: Secondary | ICD-10-CM

## 2017-12-25 ENCOUNTER — Other Ambulatory Visit (HOSPITAL_COMMUNITY): Payer: No Typology Code available for payment source | Admitting: Psychology

## 2017-12-25 DIAGNOSIS — T7421XS Adult sexual abuse, confirmed, sequela: Secondary | ICD-10-CM

## 2017-12-25 DIAGNOSIS — F4312 Post-traumatic stress disorder, chronic: Secondary | ICD-10-CM

## 2017-12-25 DIAGNOSIS — F1020 Alcohol dependence, uncomplicated: Secondary | ICD-10-CM

## 2017-12-26 ENCOUNTER — Other Ambulatory Visit (HOSPITAL_COMMUNITY): Payer: No Typology Code available for payment source | Admitting: Psychology

## 2017-12-26 DIAGNOSIS — F1020 Alcohol dependence, uncomplicated: Secondary | ICD-10-CM

## 2017-12-27 ENCOUNTER — Encounter (HOSPITAL_COMMUNITY): Payer: Self-pay | Admitting: Licensed Clinical Social Worker

## 2017-12-27 ENCOUNTER — Encounter (HOSPITAL_COMMUNITY): Payer: Self-pay | Admitting: Psychology

## 2017-12-27 NOTE — Progress Notes (Signed)
    Daily Group Progress Note  Program: CD-IOP   Group Time: 1-2:30   Participation Level: Active  Behavioral Response: Appropriate  Type of Therapy: Process Group  Topic: Process: The first part of group began with process. Group members shared about their experiences in early recovery since we last met on Monday. There was one new group member in attendance. All other group members were also present. The Medical Director met with two group members individually. One drug test was collected.     Group Time: 2:30-4:00   Participation Level: Active  Behavioral Response: Appropriate  Type of Therapy: Psycho-education Group  Topic: Psycho-ed: Addiction and the Brain; The second half of group was spent in psycho-ed discussing the neurobiology of the brain. Before starting the PowerPoint slide show, group members engaged in a mindfulness body scan and were encouraged to share what the experience was like for them. After, group members were shown a slide show of images explaining how drug use affects the brain in relation to pre-frontal cortex functioning and dopamine levels. Members asked questions about the long-lasting impact of addiction. Group members processed how the information presented to group members influences their recovery.   Summary: The patient reported that she had attended two [employer] meetings since we last met Monday. The patient reported that she applied for a job at the new [employer] and felt that it "went well." The patient shared she also applied for a job at [employer] and described that application process as better than [employer]. She reported that she was able to explain her legal charges to the HR staff at [facility]. The patient shared that a friend she had made in the fellowship of AA had drank last Tuesday and "has not came back."  When asked about how this made her feel, the patient stated, "I'm sad, I miss her." The patient shared that she plans to call her friend once a  week and leave a message letting know she is loved and missed. After completing a mindfulness body scan exercise, the patient shared that she realized she had a lot of tension in her shoulders and neck. She reported that she was able to relax those muscles.The patient was attentive throughout the psycho-ed on the biological origins of addiction. She acknowledged that she was familiar with the importance of dopamine in the addiction process, but gained new insights through the slide show. The patient responded well to this intervention.   UDS collected: No  AA/NA attended?: Yes, Tuesday and Wednesday mornings  Sponsor?: Yes   BH-CIOPB CHEM

## 2017-12-27 NOTE — Progress Notes (Signed)
Dawn Jackson is a 47 y.o. female patient who presents for individual CD-IOP weekly counseling session. She is alert, engaged, funny, and open in session. She states she feels she is doing well in recovery and appears somewhat surprised by this reality. Counselor spends time encouraging pt by reflecting back on her changes that she has made since entering CD-IOP. Counselor and pt reviewed the treatment plan and discussed progress towards treatment goals. Pt agreed that she is staying sober, building proper social support, opening up more emotionally, and continues to look for employment. She has not secured a job as of now but remains vigilant in applying for "around 20 jobs per week". Counselor and pt discuss pt's communication strategy for asking for financial help from her sister.        Margo CommonWesley E Akaash Vandewater, LCAS-A

## 2017-12-27 NOTE — Progress Notes (Signed)
    Daily Group Progress Note  Program: CD-IOP   12/27/2017 Malley Jule SerM Leinbach [id number]  Diagnosis:  Alcohol use disorder, severe, dependence (HCC)  Chronic anxiety  Chronic post-traumatic stress disorder (PTSD)   Sobriety Date: 6/18  Group Time: 1-2:30  Participation Level: Active  Behavioral Response: Appropriate and Sharing  Type of Therapy: Process Group  Interventions: CBT  Topic: Pts were active and engaged in group processing session. Counselor facilitated CBT-based processing questions to help pts discuss their recovery from mind-altering drugs/alcohol. Emphasis was placed on pts disclosing their challenges and successes pertaining to their treatment plan goals. One group member graduated successfully. One group member was absent due to entering residential tx and one group member returned after 1 week of unplanned absence and reported return to alcohol use for 2 days. The group processed their feelings about these events.       Group Time: 2:30-4  Participation Level: Active  Behavioral Response: Appropriate and Sharing  Type of Therapy: Psycho-education Group  Interventions: Conservator, museum/galleryAssertiveness Training and Psychosocial Skills: 5 Love Languages  Topic: Patient were active and engaged in psychoeducation session discussing the "5 Love Languages" and implications it has for pt's ability to communicate their needs for love, connection, and meaning to others. Pts took a brief 30 question test to analyze and predict their "love language" and discussed the possibilities for improving their requests of connection and vulnerability. Pts were provided examples of how to show "self love" based on their love language results.    Summary: Pt was engaged in group, w/ guarded demeanor, stronger eye contact than previous sessions. She reported attending 5 AA meetings since last group. She admitted to having thoughts of "thinking a beer would be nice" but quickly distracted herself  and kept walking past the bar she happen to be walking by at the time. She reported she is "becoming more aware" of her thoughts and judging them less. She expressed dismay that her auto insurance was increasing though she appeared to accept this as "life on life's terms". Pt had participated in a 5 Love Languages activity in previous therapy and stated that the quiz confirmed her previously held beliefs about her love language type. Pt was given examples of ways to show "self love to her body" through massage, essential oils, baths, and masturbation.    UDS collected: No Results: most recent negative  AA/NA attended?: YesFriday, Saturday and Sunday  Sponsor?: Yes   Dorann LodgeWes Prudie Guthridge, LPCA LCASA 12/27/2017 10:50 AM

## 2017-12-29 ENCOUNTER — Encounter (HOSPITAL_COMMUNITY): Payer: Self-pay | Admitting: Psychology

## 2017-12-29 NOTE — Progress Notes (Signed)
    Daily Group Progress Note  Program: CD-IOP   12/29/2017 Dawn KubaAlexa M Fluckiger [id number]  Diagnosis: Alcohol Use Disorder, severe  Sobriety Date: 6/18  Group Time: 1-2:30pm  Participation Level: Active  Behavioral Response: Sharing  Type of Therapy: Process Group  Interventions: Supportive  Topic: Process: the first half of group was spent in process. Members shared about current challenges, struggles and successes in early recovery. The subject of 'hitting bottom' brought on a lively conversation among group members with differing opinions about just what that means? Group members were asked to pick tongue depressors with recovery-based terms and share their experiences or feelings about them. [facility] students [facility] [employer] were present today. One drug test was collected.  Group Time: 2:30-4pm  Participation Level: Active  Behavioral Response: Appropriate  Type of Therapy: Psycho-education Group  Interventions: Strength-based  Topic: Psycho-Ed: "The four main causes of Relapse; which was is the most likely for you"? The second half of group consisted of a psycho education on relapse. A handout was provided identifying the four most common reasons that people 'return to use'. Members were asked to share what would be the most likely reason for them to use? The two most common included 'relieving emotional pain' and 'psychological longing for the euphoria' or what is referred to as 'romancing' the drug. The discussion proved insightful with good disclosure among group members.    Summary: The patient reported she had dreamt about her daughter. She explained she had dreamed of her at different ages, including when she was a little girl, when she had last seen her and then she dreamed about her as she is today. When she woke up, she wrote in a journal that she will give to her daughter someday. It includes her thoughts and feelings about her. she explained he had not seen her at all in  over four years but showed another member a picture of her daughter as she had been when she was about [age] years old when she last saw her. The patient's phrase on the stick was 'relationships in recovery'. The patient noted that she has worked to develop new relationships in recovery and developed some close friends. That is one reason she is so concerned about the friend who went back out and hasn't returned to a meeting yet. "I talked to her every day and I really miss her". Of course, if she doesn't get back into the rooms, I will not be able to remain friends with her. In the psycho-ed, the patient reported emotional pain has always been a big trigger for her, but also the psychological yearning and momentary amnesia that she can have just one drink. The patient provided helpful feedback to fellow group members and responded well to this intervention.   UDS collected: No  AA/NA attended?Yes, one  This morning  Sponsor?: Yes   Charmian Muffnn Eddis Pingleton, LCAS 12/29/2017 2:42 PM

## 2017-12-30 ENCOUNTER — Encounter (HOSPITAL_COMMUNITY): Payer: Self-pay | Admitting: Psychology

## 2017-12-30 ENCOUNTER — Other Ambulatory Visit (HOSPITAL_BASED_OUTPATIENT_CLINIC_OR_DEPARTMENT_OTHER): Payer: No Typology Code available for payment source | Admitting: Psychology

## 2017-12-30 DIAGNOSIS — F1020 Alcohol dependence, uncomplicated: Secondary | ICD-10-CM

## 2017-12-30 DIAGNOSIS — F419 Anxiety disorder, unspecified: Secondary | ICD-10-CM

## 2017-12-30 DIAGNOSIS — F4312 Post-traumatic stress disorder, chronic: Secondary | ICD-10-CM

## 2017-12-30 NOTE — Progress Notes (Signed)
    Daily Group Progress Note  Program: CD-IOP   12/30/2017 Roxine Caddy Baldo Daub [id number]  Diagnosis:  Alcohol use disorder, severe, dependence ([place])  Chronic anxiety  Chronic post-traumatic stress disorder (PTSD)   Sobriety Date: 6/18  Group Time: 1-2:30  Participation Level: Active  Behavioral Response: Appropriate and Sharing  Type of Therapy: Process Group  Interventions: CBT  Topic: Pts were active and engaged in group processing session. Counselor facilitated CBT-based processing questions to help pts discuss their recovery from mind-altering drugs/alcohol. Emphasis was placed on pts disclosing their challenges and successes pertaining to their treatment plan goals. UDS samples were collected from 2 members. One member met w/ medical director to discuss MAT.       Group Time: 2:30-4  Participation Level: Active  Behavioral Response: Appropriate and Sharing  Type of Therapy: Psycho-education Group  Interventions: Other: MAT, Pharmacist  Topic: Patient were active and engaged in psychoeducation session in which a guest pharmacist discussed addiction medications. Eleana P from [facility] discussed MAT, SSRI's, and medicaitons for sleep. Pts asked many questions about various drug interactions. At one point a pt asked a question that indicated he was interested in learning how to increase the effects of illicit substances, which Eleana refrained from explaining and redirected group to discuss appropriate topics.     Summary: Pt was active and engaged. She made good eye contact, had relaxed body posture, and low agitation. Attended 4 AA meetings. Was happy to report she has hit 90 days of sobriety and plans to pick up a chip at [place]. Pt shared about her frustration and disappointment in a friend who is no longer returning to AA after 6 mo of sobriety. Pt spoke w/ sponsor to discuss feelings and how she should respond to someone asking her for $20. Pt is hopeful  about an upcoming job interview at a [employer] firm tomorrow. Pt asked about the price of [place].    UDS collected: Yes Results: pending  AA/NA attended?: YesFriday, Saturday and Sunday  Sponsor?: Yes   Youlanda Roys, LPCA LCASA 12/30/2017 4:33 PM

## 2017-12-31 ENCOUNTER — Encounter (HOSPITAL_COMMUNITY): Payer: Self-pay | Admitting: Psychology

## 2018-01-01 ENCOUNTER — Encounter (HOSPITAL_COMMUNITY): Payer: Self-pay | Admitting: Medical

## 2018-01-01 ENCOUNTER — Other Ambulatory Visit (INDEPENDENT_AMBULATORY_CARE_PROVIDER_SITE_OTHER): Payer: No Typology Code available for payment source | Admitting: Psychology

## 2018-01-01 DIAGNOSIS — F1020 Alcohol dependence, uncomplicated: Secondary | ICD-10-CM

## 2018-01-01 DIAGNOSIS — F1291 Cannabis use, unspecified, in remission: Secondary | ICD-10-CM

## 2018-01-01 DIAGNOSIS — F4312 Post-traumatic stress disorder, chronic: Secondary | ICD-10-CM

## 2018-01-01 DIAGNOSIS — Z6372 Alcoholism and drug addiction in family: Secondary | ICD-10-CM

## 2018-01-01 DIAGNOSIS — Z87898 Personal history of other specified conditions: Secondary | ICD-10-CM

## 2018-01-01 DIAGNOSIS — F1290 Cannabis use, unspecified, uncomplicated: Secondary | ICD-10-CM

## 2018-01-01 DIAGNOSIS — F341 Dysthymic disorder: Secondary | ICD-10-CM

## 2018-01-01 DIAGNOSIS — F419 Anxiety disorder, unspecified: Secondary | ICD-10-CM

## 2018-01-01 DIAGNOSIS — Z62810 Personal history of physical and sexual abuse in childhood: Secondary | ICD-10-CM

## 2018-01-01 DIAGNOSIS — T7421XS Adult sexual abuse, confirmed, sequela: Secondary | ICD-10-CM

## 2018-01-01 DIAGNOSIS — Z811 Family history of alcohol abuse and dependence: Secondary | ICD-10-CM

## 2018-01-01 DIAGNOSIS — F1421 Cocaine dependence, in remission: Secondary | ICD-10-CM

## 2018-01-01 MED ORDER — PROPRANOLOL HCL 10 MG PO TABS: 10.0000 mg | ORAL_TABLET | Freq: Three times a day (TID) | ORAL | 1 refills | Status: DC

## 2018-01-01 MED ORDER — TRAZODONE HCL 100 MG PO TABS: 100.0000 mg | ORAL_TABLET | Freq: Every evening | ORAL | 1 refills | Status: DC | PRN

## 2018-01-01 MED ORDER — GABAPENTIN 300 MG PO CAPS: 300.0000 mg | ORAL_CAPSULE | Freq: Three times a day (TID) | ORAL | 1 refills | Status: DC

## 2018-01-01 MED ORDER — DULOXETINE HCL 60 MG PO CPEP: 120.0000 mg | ORAL_CAPSULE | Freq: Every day | ORAL | 1 refills | Status: DC
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
## 2018-01-02 ENCOUNTER — Encounter (HOSPITAL_COMMUNITY): Payer: Self-pay | Admitting: Psychology

## 2018-01-02 ENCOUNTER — Telehealth (HOSPITAL_COMMUNITY): Payer: Self-pay | Admitting: Psychology

## 2018-01-02 ENCOUNTER — Other Ambulatory Visit (HOSPITAL_BASED_OUTPATIENT_CLINIC_OR_DEPARTMENT_OTHER): Payer: No Typology Code available for payment source | Admitting: Psychology

## 2018-01-02 DIAGNOSIS — F1020 Alcohol dependence, uncomplicated: Secondary | ICD-10-CM

## 2018-01-02 DIAGNOSIS — F4312 Post-traumatic stress disorder, chronic: Secondary | ICD-10-CM

## 2018-01-02 NOTE — Progress Notes (Signed)
    Daily Group Progress Note  Program: CD-IOP   01/02/2018 Tyrone AppleAlexa Jule SerM Jackson [id number]  Diagnosis:  Alcohol use disorder, severe, dependence (HCC)  Chronic post-traumatic stress disorder (PTSD)   Sobriety Date: 6/18  Group Time: 1-2:30  Participation Level: Active  Behavioral Response: Appropriate and Sharing  Type of Therapy: Process Group  Interventions: CBT  Topic: Pts were active and engaged in group processing session. Counselor facilitated CBT-based processing questions to help pts discuss their recovery from mind-altering drugs/alcohol. Emphasis was placed on pts disclosing their challenges and successes pertaining to their treatment plan goals. UDS samples were collected from 1 member.        Group Time: 2:30-4  Participation Level: Active  Behavioral Response: Appropriate and Sharing  Type of Therapy: Psycho-education Group  Interventions: Other: Emotional Vocabulary  Topic:  Patient were active and engaged in psychoeducation session in which pts played a game of Jenga blocks w/ emotions written on them. Emotions were discussed and pts were asked to describe their physiological response to varying emotions. 2 [employer] Med students joined the discussion and participated actively.     Summary: Pt was active and engaged in session. She presents as upbeat, peaceful, and coherent. Pt had 2 job interviews this morning and reflected on an active week of job searching. Pt was encouraging of other members and spoke thoughtfully on various emotions. Pt asked for feedback from group about whether or not to take first job she got offered. Pt stated she has not felt "lonely" in a long time. She states her time alone is no longer lonely bc she knows she can reach out to others in [place] and in this group.    UDS collected: No Results: negative  AA/NA attended?: YesFriday, Saturday and Sunday  Sponsor?: Yes  Dorann LodgeWes Obediah Welles, LPCA LCASA 01/02/2018 4:15 PM

## 2018-01-06 ENCOUNTER — Encounter (HOSPITAL_COMMUNITY): Payer: Self-pay | Admitting: Psychology

## 2018-01-06 ENCOUNTER — Other Ambulatory Visit (HOSPITAL_BASED_OUTPATIENT_CLINIC_OR_DEPARTMENT_OTHER): Payer: No Typology Code available for payment source | Admitting: Psychology

## 2018-01-06 DIAGNOSIS — F4312 Post-traumatic stress disorder, chronic: Secondary | ICD-10-CM

## 2018-01-06 DIAGNOSIS — F1020 Alcohol dependence, uncomplicated: Secondary | ICD-10-CM
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
## 2018-01-07 ENCOUNTER — Encounter (HOSPITAL_COMMUNITY): Payer: Self-pay | Admitting: Psychology

## 2018-01-07 NOTE — Progress Notes (Signed)
    Daily Group Progress Note  Program: CD-IOP   01/07/2018 Roxine Caddy Baldo Daub [id number]  Diagnosis:  Alcohol use disorder, severe, dependence ([place])  Chronic post-traumatic stress disorder (PTSD)   Sobriety Date: 6/18  Group Time: 1-2:30  Participation Level: Active  Behavioral Response: Appropriate and Sharing  Type of Therapy: Process Group  Interventions: CBT  Topic: Pts were active and engaged in group processing session. Counselor facilitated CBT-based processing questions to help pts discuss their recovery from mind-altering drugs/alcohol. Emphasis was placed on pts disclosing their challenges and successes pertaining to their treatment plan goals. UDS samples were collected from 4 members. Medical director met w/ 2 pts to discuss MAT compliance.       Group Time: 2:30-4  Participation Level: Active  Behavioral Response: Appropriate and Sharing  Type of Therapy: Psycho-education Group  Interventions: CBT and Psychosocial Skills: Group Therapy Skills  Topic: Patient were active and engaged in psychoeducation session in which counselors led discussion on group dynamics and a handout entitled "Giving the Group Your Best". Counselors discussed how to "take risks" in group therapy and be authentic. This led to deeper disclosures from some members.     Summary: Pt was active, engaged, made strong eye contact, and disclosed deeper awareness of her emotional needs to group. She had appropriate affect and mood. She reported attending 6 AA meetings since last group. She spent time w/ her sister talking deeply about her recovery and how she needs to be supported by her sister. Pt continues to interview for jobs at least 3 times per week. Pt states she feels she is still on step 1 and "this is ok". Pt plans to speak at a tx facility this week after being invited to share her story.    UDS collected: No Results: most recent 9/18 negative  AA/NA attended?: [place], Saturday  and Sunday  Sponsor?: Yes   Youlanda Roys, LPCA LCASA 01/07/2018 9:40 AM

## 2018-01-08 ENCOUNTER — Other Ambulatory Visit (HOSPITAL_COMMUNITY): Payer: Self-pay | Admitting: Medical

## 2018-01-08 ENCOUNTER — Other Ambulatory Visit (HOSPITAL_COMMUNITY): Payer: No Typology Code available for payment source | Admitting: Psychology

## 2018-01-08 DIAGNOSIS — F1020 Alcohol dependence, uncomplicated: Secondary | ICD-10-CM

## 2018-01-08 DIAGNOSIS — F4312 Post-traumatic stress disorder, chronic: Secondary | ICD-10-CM

## 2018-01-08 MED ORDER — DULOXETINE HCL 60 MG PO CPEP: 120.0000 mg | ORAL_CAPSULE | Freq: Every day | ORAL | 1 refills | Status: DC

## 2018-01-08 MED ORDER — TRAZODONE HCL 100 MG PO TABS: 100.0000 mg | ORAL_TABLET | Freq: Every evening | ORAL | 1 refills | Status: DC | PRN

## 2018-01-08 MED ORDER — PROPRANOLOL HCL 10 MG PO TABS: 10.0000 mg | ORAL_TABLET | Freq: Three times a day (TID) | ORAL | 1 refills | Status: DC

## 2018-01-08 NOTE — Progress Notes (Signed)
Reordered drugs at [facility] due to cost at [facility] pharmacy -coupon use was limited to 1 visit!!!

## 2018-01-09 ENCOUNTER — Other Ambulatory Visit (HOSPITAL_COMMUNITY): Payer: No Typology Code available for payment source | Admitting: Psychology

## 2018-01-09 DIAGNOSIS — F1020 Alcohol dependence, uncomplicated: Secondary | ICD-10-CM

## 2018-01-09 DIAGNOSIS — F4312 Post-traumatic stress disorder, chronic: Secondary | ICD-10-CM

## 2018-01-10 ENCOUNTER — Encounter: Payer: Self-pay | Admitting: Family Medicine

## 2018-01-10 ENCOUNTER — Other Ambulatory Visit: Payer: Self-pay

## 2018-01-10 ENCOUNTER — Ambulatory Visit (INDEPENDENT_AMBULATORY_CARE_PROVIDER_SITE_OTHER): Payer: Self-pay | Admitting: Family Medicine

## 2018-01-10 ENCOUNTER — Encounter (HOSPITAL_COMMUNITY): Payer: Self-pay | Admitting: Psychology

## 2018-01-10 VITALS — BP 76/54 | HR 75 | Temp 98.3°F | Resp 20 | Ht 66.81 in | Wt 124.0 lb

## 2018-01-10 DIAGNOSIS — Z0001 Encounter for general adult medical examination with abnormal findings: Secondary | ICD-10-CM

## 2018-01-10 DIAGNOSIS — Z13220 Encounter for screening for lipoid disorders: Secondary | ICD-10-CM

## 2018-01-10 DIAGNOSIS — Z0000 Encounter for general adult medical examination without abnormal findings: Secondary | ICD-10-CM

## 2018-01-10 DIAGNOSIS — I959 Hypotension, unspecified: Secondary | ICD-10-CM

## 2018-01-10 DIAGNOSIS — R001 Bradycardia, unspecified: Secondary | ICD-10-CM

## 2018-01-10 DIAGNOSIS — Z23 Encounter for immunization: Secondary | ICD-10-CM

## 2018-01-10 LAB — POCT CBC
Granulocyte percent: 68.9 %G (ref 37–80)
HCT, POC: 41.6 % (ref 37.7–47.9)
Hemoglobin: 14.1 g/dL (ref 12.2–16.2)
Lymph, poc: 1.1 (ref 0.6–3.4)
MCH, POC: 32.8 pg — AB (ref 27–31.2)
MCHC: 33.8 g/dL (ref 31.8–35.4)
MCV: 96.9 fL (ref 80–97)
MID (cbc): 0.9 (ref 0–0.9)
MPV: 6.8 fL (ref 0–99.8)
POC Granulocyte: 4.3 (ref 2–6.9)
POC LYMPH PERCENT: 17.2 %L (ref 10–50)
POC MID %: 13.9 %M — AB (ref 0–12)
Platelet Count, POC: 247 10*3/uL (ref 142–424)
RBC: 4.29 M/uL (ref 4.04–5.48)
RDW, POC: 12.9 %
WBC: 6.2 10*3/uL (ref 4.6–10.2)

## 2018-01-10 NOTE — Progress Notes (Signed)
9/27/20192:08 PM  Dawn Jackson 11-Feb-1971, 47 y.o. female [id number]  Chief Complaint  Patient presents with  . Annual Exam    HPI:   Patient is a 47 y.o. female who presents today for CPE  Patient Care Team: Merri Brunette, MD as PCP - General (Internal Medicine)  Currently on [employer] patient assistance  Cervical Cancer Screening: June 2018, normal, mirena IUD Breast Cancer Screening: Dec 2018, normal Colorectal Cancer Screening: at age 36 Bone Density Testing: at age 29 HIV Screening: reports past testing, negative Seasonal Influenza Vaccination: today Td/Tdap Vaccination: > 10 years, will get once insurance is back Pneumococcal Vaccination: at age 22 Zoster Vaccination: at age 33 Frequency of Dental evaluation: Q6 months Frequency of Eye evaluation: has not been in several years, uses readers  Fall Risk  01/10/2018  Falls in the past year? No     Depression screen [facility] 2/9 01/10/2018 04/25/2015  Decreased Interest 0 0  Down, Depressed, Hopeless 0 1  PHQ - 2 Score 0 1  Some encounter information is confidential and restricted. Go to Review Flowsheets activity to see all data.    No Known Allergies  Prior to Admission medications   Medication Sig Start Date End Date Taking? Authorizing Provider  DULoxetine (CYMBALTA) 60 MG capsule Take 2 capsules (120 mg total) by mouth daily. For mood control 01/08/18 04/08/18 Yes Court Joy, PA-C  gabapentin (NEURONTIN) 300 MG capsule Take 1 capsule (300 mg total) by mouth 3 (three) times daily. 01/01/18  Yes Court Joy, PA-C  propranolol (INDERAL) 10 MG tablet Take 1 tablet (10 mg total) by mouth 3 (three) times daily. 01/08/18  Yes Court Joy, PA-C  traZODone (DESYREL) 100 MG tablet Take 1 tablet (100 mg total) by mouth at bedtime as needed for sleep. 01/08/18 04/08/18 Yes Court Joy, PA-C    Past Medical History:  Diagnosis Date  . Allergy   . Anxiety   . Depression   . PTSD (post-traumatic stress disorder)    . Substance abuse [facility])     Past Surgical History:  Procedure Laterality Date  . BREAST BIOPSY    . WISDOM TOOTH EXTRACTION      Social History   Tobacco Use  . Smoking status: Current Every Day Smoker    Packs/day: 1.00    Years: 6.00    Pack years: 6.00    Types: Cigarettes  . Smokeless tobacco: Never Used  Substance Use Topics  . Alcohol use: Yes    Comment: daily usage 1-2 bottles of wine    Family History  Problem Relation Age of Onset  . Cancer Mother   . Heart disease Father   . COPD Maternal Grandfather   . Cancer Paternal Grandmother   . Breast cancer Paternal Grandmother 50    Review of Systems  Constitutional: Positive for diaphoresis (starting to have some night sweats). Negative for chills, fever, malaise/fatigue and weight loss.  HENT: Negative for congestion, sore throat and tinnitus.   Eyes: Negative for blurred vision and double vision.  Respiratory: Negative for cough and shortness of breath.   Cardiovascular: Negative for chest pain, palpitations and leg swelling.  Gastrointestinal: Negative for abdominal pain, nausea and vomiting.  Genitourinary: Negative for dysuria and hematuria.  Musculoskeletal: Negative for falls, joint pain and myalgias.  Neurological: Positive for dizziness (not currently). Negative for headaches.  Psychiatric/Behavioral: The patient is nervous/anxious. The patient does not have insomnia.      OBJECTIVE:  Blood pressure (!) 87/51,  pulse 75, temperature 98.3 F (36.8 C), temperature source Oral, resp. rate 20, height 5' 6.81" (1.697 m), weight 124 lb (56.2 kg), SpO2 98 %. Body mass index is 19.53 kg/m.   BP Readings from Last 3 Encounters:  01/10/18 (!) 87/51  11/27/17 111/77  10/01/17 118/85   Wt Readings from Last 3 Encounters:  01/10/18 124 lb (56.2 kg)  09/28/17 125 lb (56.7 kg)  08/09/17 125 lb (56.7 kg)   Orthostatic VS for the past 24 hrs:  BP- Lying Pulse- Lying BP- Sitting Pulse- Sitting BP- Standing  at 0 minutes Pulse- Standing at 0 minutes  01/10/18 1511 109/72 54 (!) 83/60 69 (!) 88/56 82     Physical Exam  Constitutional: She is oriented to person, place, and time. She appears well-developed and well-nourished.  HENT:  Head: Normocephalic and atraumatic.  Right Ear: Hearing, tympanic membrane, external ear and ear canal normal.  Left Ear: Hearing, tympanic membrane, external ear and ear canal normal.  Mouth/Throat: Oropharynx is clear and moist.  Eyes: Pupils are equal, round, and reactive to light. Conjunctivae and EOM are normal.  Neck: Neck supple. No thyromegaly present.  Cardiovascular: Normal rate, regular rhythm, normal heart sounds and intact distal pulses. Exam reveals no gallop and no friction rub.  No murmur heard. Pulmonary/Chest: Effort normal and breath sounds normal. She has no wheezes. She has no rales.  Abdominal: Soft. Bowel sounds are normal. She exhibits no distension and no mass. There is no tenderness.  Musculoskeletal: Normal range of motion. She exhibits no edema.  Lymphadenopathy:    She has no cervical adenopathy.  Neurological: She is alert and oriented to person, place, and time. She has normal reflexes. No cranial nerve deficit. Gait normal.  Skin: Skin is warm and dry.  Psychiatric: She has a normal mood and affect.  Nursing note and vitals reviewed.   Results for orders placed or performed in visit on 01/10/18 (from the past 24 hour(s))  POCT CBC     Status: Abnormal   Collection Time: 01/10/18  3:03 PM  Result Value Ref Range   WBC 6.2 4.6 - 10.2 K/uL   Lymph, poc 1.1 0.6 - 3.4   POC LYMPH PERCENT 17.2 10 - 50 %L   MID (cbc) 0.9 0 - 0.9   POC MID % 13.9 (A) 0 - 12 %M   POC Granulocyte 4.3 2 - 6.9   Granulocyte percent 68.9 37 - 80 %G   RBC 4.29 4.04 - 5.48 M/uL   Hemoglobin 14.1 12.2 - 16.2 g/dL   HCT, POC 16.1 09.6 - 47.9 %   MCV 96.9 80 - 97 fL   MCH, POC 32.8 (A) 27 - 31.2 pg   MCHC 33.8 31.8 - 35.4 g/dL   RDW, POC 04.5 %    Platelet Count, POC 247 142 - 424 K/uL   MPV 6.8 0 - 99.8 fL    My interpretation of EKG:  Sinus, HR 53, no st changes  ASSESSMENT and PLAN   1. Annual physical exam  Routine HCM labs ordered. HCM reviewed/discussed. Anticipatory guidance regarding healthy weight, lifestyle and choices given.   2. Need for influenza vaccination - Flu Vaccine QUAD 36+ mos IM  3. Hypotension, unspecified hypotension type Asymptomatic, stop BB, push fluids, re-eval tomorrow. Strict ER precautions given - Comprehensive metabolic panel - TSH - EKG 12-Lead - POCT CBC - Phosphorus - Magnesium - Orthostatic vital signs  4. Screening for lipid disorders - Lipid panel  5. Sinus bradycardia  Stop BB  Return in about 1 year (around 01/11/2019) for low BP, ok to overbook with me.    Myles Lipps, MD Primary Care at [facility] [address] [place], [place] [address] Ph.  [phone] Fax [phone]

## 2018-01-10 NOTE — Progress Notes (Signed)
    Daily Group Progress Note  Program: CD-IOP   01/10/2018 Dawn Jackson [id number]  Diagnosis: Alcohol Use Disorder, severe  Sobriety Date: 6/18  Group Time: 1-2:30pm  Participation Level: Active  Behavioral Response: Sharing  Type of Therapy: Process Group  Interventions: Supportive  Topic: Process: The first half of group was spent in process. Members shared about their experiences in early recovery and identified any speed bumps or shining moments. After check-in, the Popsicle sticks with recovery-related words or phrases were distributed and members shared how those related to their own lives. Members shared openly about the challenges they are facing and provided feedback to each other.   Group Time: 2:30-4pm  Participation Level: Active  Behavioral Response: Sharing  Type of Therapy: Psycho-education Group  Interventions: Strength-based  Topic: Psycho-Ed: Resentments. The second half of group was spent in a psycho-education session. The topic was 'Resentments' and a handout was passed around. The group discussed what is meant by resentments and how they might affect those that have them. Patients were then provided with an opportunity to complete the first page of the handout that asked them to identify a resentment and what they say to themselves about the person or entity for whom they feel resentful.   Summary: The patient reported she had attended two [place] meetings since we met yesterday. She described having completed several phone interviews with face-to-face coming up. However, she admitted, "No one is offering me a job". The patient reported she had slept in this morning and was surprised that she had slept so long. She had gone to an 8 am meeting and then upon returning home, decided to lay down and slept for three hours. She reported her job search has been 'discouraging'. The word she drew from the popsicle sticks was 'communication'. 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 "I am not forthcoming". She also pointed out that her family of origin had never talked about anything but happy things and never discussed or addressed emotions like anger or pain. In the psycho-ed on resentments, the patient reported "I have resentments towards my ex and resentments towards myself". She recognizes how those towards herself can be troubling for her and her recovery. The patient offered helpful feedback to fellow group members. she responded well to this intervention.    UDS collected: No Results:  AA/NA attended?: YesWednesday and Thursday  Sponsor?: Yes   Dawn Jackson, LCAS 01/10/2018 11:39 AM

## 2018-01-10 NOTE — Patient Instructions (Signed)
° ° ° °  If you have lab work done today you will be contacted with your lab results within the next 2 weeks.  If you have not heard from us then please contact us. The fastest way to get your results is to register for My Chart. ° ° °IF you received an x-ray today, you will receive an invoice from [facility]. Please contact [facility] at [phone] with questions or concerns regarding your invoice.  ° °IF you received labwork today, you will receive an invoice from [facility]. Please contact [facility] at [phone] with questions or concerns regarding your invoice.  ° °Our billing staff will not be able to assist you with questions regarding bills from these companies. ° °You will be contacted with the lab results as soon as they are available. The fastest way to get your results is to activate your My Chart account. Instructions are located on the last page of this paperwork. If you have not heard from us regarding the results in 2 weeks, please contact this office. °  ° ° ° °

## 2018-01-11 ENCOUNTER — Encounter: Payer: Self-pay | Admitting: Family Medicine

## 2018-01-11 ENCOUNTER — Ambulatory Visit (INDEPENDENT_AMBULATORY_CARE_PROVIDER_SITE_OTHER): Payer: Self-pay | Admitting: Family Medicine

## 2018-01-11 VITALS — BP 99/68 | HR 76 | Temp 98.6°F | Ht 66.81 in | Wt 124.0 lb

## 2018-01-11 DIAGNOSIS — R031 Nonspecific low blood-pressure reading: Secondary | ICD-10-CM

## 2018-01-11 LAB — COMPREHENSIVE METABOLIC PANEL
ALT: 15 IU/L (ref 0–32)
AST: 17 IU/L (ref 0–40)
Albumin/Globulin Ratio: 2 (ref 1.2–2.2)
Albumin: 4.6 g/dL (ref 3.5–5.5)
Alkaline Phosphatase: 40 IU/L (ref 39–117)
BUN/Creatinine Ratio: 18 (ref 9–23)
BUN: 14 mg/dL (ref 6–24)
Bilirubin Total: 0.5 mg/dL (ref 0.0–1.2)
CO2: 24 mmol/L (ref 20–29)
Calcium: 9.4 mg/dL (ref 8.7–10.2)
Chloride: 101 mmol/L (ref 96–106)
Creatinine, Ser: 0.8 mg/dL (ref 0.57–1.00)
GFR calc Af Amer: 100 mL/min/{1.73_m2} (ref >59)
GFR calc non Af Amer: 87 mL/min/{1.73_m2} (ref >59)
Globulin, Total: 2.3 g/dL (ref 1.5–4.5)
Glucose: 63 mg/dL — ABNORMAL LOW (ref 65–99)
Potassium: 3.9 mmol/L (ref 3.5–5.2)
Sodium: 141 mmol/L (ref 134–144)
Total Protein: 6.9 g/dL (ref 6.0–8.5)

## 2018-01-11 LAB — LIPID PANEL
Chol/HDL Ratio: 3.7 ratio (ref 0.0–4.4)
Cholesterol, Total: 179 mg/dL (ref 100–199)
HDL: 49 mg/dL (ref >39)
LDL Calculated: 103 mg/dL — ABNORMAL HIGH (ref 0–99)
Triglycerides: 134 mg/dL (ref 0–149)
VLDL Cholesterol Cal: 27 mg/dL (ref 5–40)

## 2018-01-11 LAB — TSH: TSH: 1.07 u[IU]/mL (ref 0.450–4.500)

## 2018-01-11 LAB — PHOSPHORUS: Phosphorus: 4.3 mg/dL (ref 2.5–4.5)

## 2018-01-11 LAB — MAGNESIUM: Magnesium: 2.2 mg/dL (ref 1.6–2.3)

## 2018-01-11 MED ORDER — IBUPROFEN 800 MG PO TABS: 800.0000 mg | ORAL_TABLET | Freq: Three times a day (TID) | ORAL | 0 refills | Status: DC | PRN

## 2018-01-11 NOTE — Patient Instructions (Addendum)
If you have lab work done today you will be contacted with your lab results within the next 2 weeks.  If you have not heard from [place] then please contact us. The fastest way to get your results is to register for My Chart.   IF you received an x-ray today, you will receive an invoice from [facility]. Please contact [facility] at [phone] with questions or concerns regarding your invoice.   IF you received labwork today, you will receive an invoice from [place]. Please contact [facility] at [phone] with questions or concerns regarding your invoice.   Our billing staff will not be able to assist you with questions regarding bills from these companies.  You will be contacted with the lab results as soon as they are available. The fastest way to get your results is to activate your My Chart account. Instructions are located on the last page of this paperwork. If you have not heard from [place] regarding the results in 2 weeks, please contact this office.     Hypotension As your heart beats, it forces blood through your body. This force is called blood pressure. If you have hypotension, you have low blood pressure. When your blood pressure is too low, you may not get enough blood to your brain. You may feel weak, feel light-headed, have a fast heartbeat, or even pass out (faint). Follow these instructions at home: Eating and drinking  Drink enough fluids to keep your pee (urine) clear or pale yellow.  Eat a healthy diet, and follow instructions from your doctor about eating or drinking restrictions. A healthy diet includes: ? Fresh fruits and vegetables. ? Whole grains. ? Low-fat (lean) meats. ? Low-fat dairy products.  Eat extra salt only as told. Do not add extra salt to your diet unless your doctor tells you to.  Eat small meals often.  Avoid standing up quickly after you eat. Medicines  Take over-the-counter and prescription medicines only as told by your  doctor. ? Follow instructions from your doctor about changing how much you take (the dosage) of your medicines, if this applies. ? Do not stop or change your medicine on your own. General instructions  Wear compression stockings as told by your doctor.  Get up slowly from lying down or sitting.  Avoid hot showers and a lot of heat as told by your doctor.  Return to your normal activities as told by your doctor. Ask what activities are safe for you.  Do not use any products that contain nicotine or tobacco, such as cigarettes and e-cigarettes. If you need help quitting, ask your doctor.  Keep all follow-up visits as told by your doctor. This is important. Contact a doctor if:  You throw up (vomit).  You have watery poop (diarrhea).  You have a fever for more than 2-3 days.  You feel more thirsty than normal.  You feel weak and tired. Get help right away if:  You have chest pain.  You have a fast or irregular heartbeat.  You lose feeling (get numbness) in any part of your body.  You cannot move your arms or your legs.  You have trouble talking.  You get sweaty or feel light-headed.  You faint.  You have trouble breathing.  You have trouble staying awake.  You feel confused. This information is not intended to replace advice given to you by your health care provider. Make sure you discuss any questions you have with your health care provider.  Document Released: 06/27/2009 Document Revised: 12/20/2015 Document Reviewed: 12/20/2015 Elsevier Interactive Patient Education  2017 [employer].

## 2018-01-11 NOTE — Progress Notes (Signed)
9/28/20199:06 AM  Raygen Jule Ser [birth year], 47 y.o. female [id number]  Chief Complaint  Patient presents with  . Follow-up    bp follow up    HPI:   Patient is a 47 y.o. female who presents today for follow-up on BP  Seen yesterday for CPE Found to have low Bps 70-80/50, asymptomatic Requested her to stop propranolol IR (takes for anxiety) and change to vistaril prn Comes in today for repeat BP  She is also requesting ibu 800mg , she takes prn for cramps or headaches  Fall Risk  01/11/2018 01/10/2018  Falls in the past year? No No     Depression screen [facility] 2/9 01/11/2018 01/10/2018 04/25/2015  Decreased Interest 0 0 0  Down, Depressed, Hopeless 0 0 1  PHQ - 2 Score 0 0 1  Some encounter information is confidential and restricted. Go to Review Flowsheets activity to see all data.    No Known Allergies  Prior to Admission medications   Medication Sig Start Date End Date Taking? Authorizing Provider  DULoxetine (CYMBALTA) 60 MG capsule Take 2 capsules (120 mg total) by mouth daily. For mood control 01/08/18 04/08/18 Yes Court Joy, PA-C  gabapentin (NEURONTIN) 300 MG capsule Take 1 capsule (300 mg total) by mouth 3 (three) times daily. 01/01/18  Yes Court Joy, PA-C  traZODone (DESYREL) 100 MG tablet Take 1 tablet (100 mg total) by mouth at bedtime as needed for sleep. 01/08/18 04/08/18 Yes Court Joy, PA-C    Past Medical History:  Diagnosis Date  . Allergy   . Anxiety   . Depression   . PTSD (post-traumatic stress disorder)   . Substance abuse [facility])     Past Surgical History:  Procedure Laterality Date  . BREAST BIOPSY    . WISDOM TOOTH EXTRACTION      Social History   Tobacco Use  . Smoking status: Current Every Day Smoker    Packs/day: 1.00    Years: 6.00    Pack years: 6.00    Types: Cigarettes  . Smokeless tobacco: Never Used  Substance Use Topics  . Alcohol use: Yes    Comment: daily usage 1-2 bottles of wine    Family History    Problem Relation Age of Onset  . Cancer Mother   . Heart disease Father   . COPD Maternal Grandfather   . Cancer Paternal Grandmother   . Breast cancer Paternal Grandmother 50    ROS Per hpi  OBJECTIVE:  Blood pressure 99/68, pulse 76, temperature 98.6 F (37 C), temperature source Oral, height 5' 6.81" (1.697 m), weight 124 lb (56.2 kg), SpO2 97 %. Body mass index is 19.53 kg/m.   Physical Exam  Constitutional: She is oriented to person, place, and time. She appears well-developed and well-nourished.  HENT:  Head: Normocephalic and atraumatic.  Mouth/Throat: Mucous membranes are normal.  Eyes: Pupils are equal, round, and reactive to light. Conjunctivae and EOM are normal. No scleral icterus.  Neck: Neck supple.  Pulmonary/Chest: Effort normal.  Neurological: She is alert and oriented to person, place, and time.  Skin: Skin is warm and dry.  Psychiatric: She has a normal mood and affect.  Nursing note and vitals reviewed.    Results for orders placed or performed in visit on 01/10/18 (from the past 24 hour(s))  POCT CBC     Status: Abnormal   Collection Time: 01/10/18  3:03 PM  Result Value Ref Range   WBC 6.2 4.6 - 10.2 K/uL  Lymph, poc 1.1 0.6 - 3.4   POC LYMPH PERCENT 17.2 10 - 50 %L   MID (cbc) 0.9 0 - 0.9   POC MID % 13.9 (A) 0 - 12 %M   POC Granulocyte 4.3 2 - 6.9   Granulocyte percent 68.9 37 - 80 %G   RBC 4.29 4.04 - 5.48 M/uL   Hemoglobin 14.1 12.2 - 16.2 g/dL   HCT, POC 16.1 09.6 - 47.9 %   MCV 96.9 80 - 97 fL   MCH, POC 32.8 (A) 27 - 31.2 pg   MCHC 33.8 31.8 - 35.4 g/dL   RDW, POC 04.5 %   Platelet Count, POC 247 142 - 424 K/uL   MPV 6.8 0 - 99.8 fL  Comprehensive metabolic panel     Status: Abnormal   Collection Time: 01/10/18  3:50 PM  Result Value Ref Range   Glucose 63 (L) 65 - 99 mg/dL   BUN 14 6 - 24 mg/dL   Creatinine, Ser 4.09 0.57 - 1.00 mg/dL   GFR calc non Af Amer 87 >59 mL/min/1.73   GFR calc Af Amer 100 >59 mL/min/1.73    BUN/Creatinine Ratio 18 9 - 23   Sodium 141 134 - 144 mmol/L   Potassium 3.9 3.5 - 5.2 mmol/L   Chloride 101 96 - 106 mmol/L   CO2 24 20 - 29 mmol/L   Calcium 9.4 8.7 - 10.2 mg/dL   Total Protein 6.9 6.0 - 8.5 g/dL   Albumin 4.6 3.5 - 5.5 g/dL   Globulin, Total 2.3 1.5 - 4.5 g/dL   Albumin/Globulin Ratio 2.0 1.2 - 2.2   Bilirubin Total 0.5 0.0 - 1.2 mg/dL   Alkaline Phosphatase 40 39 - 117 IU/L   AST 17 0 - 40 IU/L   ALT 15 0 - 32 IU/L   Narrative   Performed at:  [address] [place] [address] [place], [place]  [id number] Lab Director: Jolene Schimke MD, Phone:  [phone]  TSH     Status: None   Collection Time: 01/10/18  3:50 PM  Result Value Ref Range   TSH 1.070 0.450 - 4.500 uIU/mL   Narrative   Performed at:  [address], [place], [place]  [id number] Lab Director: Jolene Schimke MD, Phone:  [phone]  Lipid panel     Status: Abnormal   Collection Time: 01/10/18  3:50 PM  Result Value Ref Range   Cholesterol, Total 179 100 - 199 mg/dL   Triglycerides 027 0 - 149 mg/dL   HDL 49 >25 mg/dL   VLDL Cholesterol Cal 27 5 - 40 mg/dL   LDL Calculated 366 (H) 0 - 99 mg/dL   Chol/HDL Ratio 3.7 0.0 - 4.4 ratio   Narrative   Performed at:  [address] [place] [address], [place], [place]  [id number] Lab Director: Jolene Schimke MD, Phone:  [phone]  Phosphorus     Status: None   Collection Time: 01/10/18  3:55 PM  Result Value Ref Range   Phosphorus 4.3 2.5 - 4.5 mg/dL   Narrative   Performed at:  [address], [place], [place]  [id number] Lab Director: Jolene Schimke MD, Phone:  [phone]  Magnesium     Status: None   Collection Time: 01/10/18  3:55 PM  Result Value Ref Range   Magnesium 2.2 1.6 - 2.3 mg/dL   Narrative   Performed at:  [address], [place], [place]  [id number]  Lab Director: Jolene Schimke MD, Phone:  [phone]    ASSESSMENT and PLAN  1. Low blood  pressure reading Much improved. Cont pushing fluids, increase salt intake. RTC precautions reviewed  Other orders - ibuprofen (ADVIL,MOTRIN) 800 MG tablet; Take 1 tablet (800 mg total) by mouth every 8 (eight) hours as needed.  Return if symptoms worsen or fail to improve.    Myles Lipps, MD Primary Care at [facility] [address] [place], [place] [address] Ph.  [phone] Fax [phone]

## 2018-01-13 ENCOUNTER — Encounter (HOSPITAL_COMMUNITY): Payer: Self-pay | Admitting: Psychology

## 2018-01-13 ENCOUNTER — Other Ambulatory Visit (HOSPITAL_COMMUNITY): Payer: No Typology Code available for payment source | Admitting: Psychology

## 2018-01-13 DIAGNOSIS — F1020 Alcohol dependence, uncomplicated: Secondary | ICD-10-CM

## 2018-01-13 NOTE — Progress Notes (Signed)
    Daily Group Progress Note  Program: CD-IOP   01/13/2018 Dawn Jackson [id number]  Diagnosis: Alcohol Use Disorder, Severe  Sobriety Date: 6/18  Group Time: 1-2:30pm  Participation Level: Active  Behavioral Response: Sharing  Type of Therapy: Process Group  Interventions: Supportive  Topic: Process: The first part of group began with process. Group members shared about their experiences in early recovery since we last met on Monday. The Medical Director met with one group member to complete a discharge plan. All group members were present. Two drug tests were collected.   Group Time: 2:30-4pm  Participation Level: Active  Behavioral Response: Sharing  Type of Therapy: Psycho-education Group  Interventions: Strength-based; Serenity Prayer  Topic: Psycho-ed: Teacher, adult education; The second half of group was spent in psycho-ed discussing the meaning of the Serenity Prayer. A handout was passed out instructing each group member to list 5 things they cannot change and list 5 things they can change. Group members were encouraged to think critically about how their answers relate to their recovery. Each group member was given the opportunity to share their lists with the group. Members provided each other with feedback and support.   Summary: The patient reported that she had attended two [place] meetings since we last met on Monday. A drug test was collected from the patient. She shared that she has been experiencing a lot of anxiety and is feeling "discombobulated." She woke up this morning unsure of what day it was. The patient has been under a great deal of stress lately. She has had 6 job interviews in the last 8 days and reports that she has two more job interviews this week. The patient shared that she is fearful of the way she has felt the last couple of days and stated, "how I am going to handle a job if I don't know what day of the week it is?" The patient also shared that their  has been a recall on the breathalyzer on her car and she needs to have the device changed within the next two days. The patient will be leaving group early on Thursday so she can arrive on time for her scheduled appointment. During the psycho-ed portion of group, the patient was able to identify several things in her life that she cannot change as well as several things that she can change. She identified she cannot change her "daughter's life" and her "husband's whereabouts." The patient identified her "attitude," "decision to exercise or not exercise," and ability to "apply for more jobs" as things she can change.    UDS collected: Yes Results: negative  AA/NA attended?: YesTuesday and Wednesday  Sponsor?: Yes   [name], LCAS 01/13/2018 4:31 PM

## 2018-01-14 ENCOUNTER — Encounter (HOSPITAL_COMMUNITY): Payer: Self-pay | Admitting: Psychology
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
## 2018-01-15 ENCOUNTER — Ambulatory Visit (HOSPITAL_COMMUNITY): Payer: Self-pay | Admitting: Psychology

## 2018-01-15 ENCOUNTER — Other Ambulatory Visit (HOSPITAL_COMMUNITY): Payer: No Typology Code available for payment source | Attending: Psychiatry | Admitting: Psychology

## 2018-01-15 DIAGNOSIS — Z62810 Personal history of physical and sexual abuse in childhood: Secondary | ICD-10-CM | POA: Insufficient documentation

## 2018-01-15 DIAGNOSIS — F341 Dysthymic disorder: Secondary | ICD-10-CM | POA: Insufficient documentation

## 2018-01-15 DIAGNOSIS — F5101 Primary insomnia: Secondary | ICD-10-CM | POA: Insufficient documentation

## 2018-01-15 DIAGNOSIS — Z791 Long term (current) use of non-steroidal anti-inflammatories (NSAID): Secondary | ICD-10-CM | POA: Insufficient documentation

## 2018-01-15 DIAGNOSIS — Z79899 Other long term (current) drug therapy: Secondary | ICD-10-CM | POA: Insufficient documentation

## 2018-01-15 DIAGNOSIS — Z6372 Alcoholism and drug addiction in family: Secondary | ICD-10-CM | POA: Insufficient documentation

## 2018-01-15 DIAGNOSIS — F5104 Psychophysiologic insomnia: Secondary | ICD-10-CM | POA: Insufficient documentation

## 2018-01-15 DIAGNOSIS — F419 Anxiety disorder, unspecified: Secondary | ICD-10-CM | POA: Insufficient documentation

## 2018-01-15 DIAGNOSIS — F1024 Alcohol dependence with alcohol-induced mood disorder: Secondary | ICD-10-CM | POA: Insufficient documentation

## 2018-01-15 DIAGNOSIS — D520 Dietary folate deficiency anemia: Secondary | ICD-10-CM | POA: Insufficient documentation

## 2018-01-15 DIAGNOSIS — F1421 Cocaine dependence, in remission: Secondary | ICD-10-CM | POA: Insufficient documentation

## 2018-01-15 DIAGNOSIS — D539 Nutritional anemia, unspecified: Secondary | ICD-10-CM | POA: Insufficient documentation

## 2018-01-15 DIAGNOSIS — F4312 Post-traumatic stress disorder, chronic: Secondary | ICD-10-CM | POA: Insufficient documentation

## 2018-01-15 DIAGNOSIS — F1020 Alcohol dependence, uncomplicated: Secondary | ICD-10-CM

## 2018-01-15 DIAGNOSIS — F1290 Cannabis use, unspecified, uncomplicated: Secondary | ICD-10-CM | POA: Insufficient documentation

## 2018-01-16 ENCOUNTER — Other Ambulatory Visit (HOSPITAL_COMMUNITY): Payer: No Typology Code available for payment source | Admitting: Psychology

## 2018-01-16 DIAGNOSIS — F1020 Alcohol dependence, uncomplicated: Secondary | ICD-10-CM

## 2018-01-17 ENCOUNTER — Encounter (HOSPITAL_COMMUNITY): Payer: Self-pay | Admitting: Psychology

## 2018-01-17 NOTE — Progress Notes (Signed)
    Daily Group Progress Note  Program: CD-IOP   01/17/2018 Erlene Quan [id number]  Diagnosis: Alcohol Use Disorder, severe.   Sobriety Date: 10/01/2017  Group Time: 1-2:30pm  Participation Level: Active  Behavioral Response: Appropriate  Type of Therapy: Process Group  Interventions: Supportive  Topic: Process: The first part of group began with process. Group members shared their experiences in early recovery since we last met on Monday. The Medical Director met with two group members to complete medication checks. All group members were present. No drug tests were collected.     Group Time: 2:30-4pm  Participation Level: Active  Behavioral Response: Appropriate  Type of Therapy: Psycho-education Group  Interventions: CBT  Topic: Psycho-ed: REBT Irrational Beliefs; The second half of the group was spent in psycho-ed discussing Rational Emotive Behavior Therapy. A handout was passed out listing 13 common Irrational Beliefs identified by Whitney Post. Time was spent explaining Ellis's A-B-C theory to the group. Group members were encouraged to identify their Irrational Beliefs and use the A-B-C model to practice changing the emotion that follows the irrational thought. Group members provided each other with support and feedback during this intervention.   Summary: The patient reported that she had attended two [place] meetings since we met last Monday. The patient reported that both [place] meetings she attended were on Tuesday and had attended none today because she had a job interview this morning. The patient shared her interview was a "great job interview." The [place] is also in recovery and she has previously been introduced to him at [employer]. During her job interview, the [place] entered the interview room and stated that he could not recommend her enough. The patient shared that she is "feeling good" but is nervous. She shared that she has a second-round job  interview next Tuesday with [employer]. During the psycho-ed portion of group, the patient identified her irrational belief as "getting a job should be easy for me." The patient identified the origin of this thought stemmed from having a life where jobs have been handed to her in the past without her doing much work. The emotion the patient identified as following this irrational belief is frustration. When the patient changed this thought to "millions of people are struggling with finding a job, I will find one, it will just take time" the new emotion is hopeful.    UDS collected: No   AA/NA attended?: Yes, Tuesday twice  Sponsor?: Yes   [name], [place] 01/17/2018 11:42 AM

## 2018-01-17 NOTE — Progress Notes (Signed)
    Daily Group Progress Note  Program: CD-IOP   01/17/2018 Dawn Jackson [id number]  Diagnosis: Alcohol Use disorder, severe  Sobriety Date: 6/18  Group Time: 1-2:30pm  Participation Level: Active  Behavioral Response: Sharing  Type of Therapy: Process Group  Interventions: Supportive  Topic: Process: The first half of group was spent in process. Members shared about the challenges or speed bumps and shining moments in early recovery. A new member appeared today and he was asked to introduce himself. He shared about a long history of crack cocaine use that left him and his family devastated. A drug test was collected from this patient.   Group Time: 2:30-4pm  Participation Level: Active  Behavioral Response: Sharing  Type of Therapy: Psycho-education Group  Interventions: Psychosocial Skills: Boundaries  Topic: Psycho-Education/Graduation: The psycho-ed today was on the subject of 'Boundaries'. A handout was provided identifying the three different kinds - healthy, porous and rigid. Members read the handout together and provided feedback with examples of their own application of boundaries. One member shared that every one of the rigid boundary descriptions matched his own experience. Another member said, "I need to loosen my boundaries". The session ended with a graduation. 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 "I need to graduate today". Kind words were offered to the woman who has been a mainstay and active group member. She expressed gratitude and wished this newly forming group a healthy and effective time here.   Summary: The patient ws smiling during the check-in and shared some good news. "I got a job", she reported. "I'm going to have to graduate today". She explained that she hd received the offer this morning and they wanted her to begin on Monday. The patient had shared  about the interview she had had in previous session and the fact that an upper mgt employee is also in recovery is hopeful. She reported she had attended three meetings since yesterday afternoon. She was very excited and relieved about the job news and her fellow group members were equally pleased for her. In the psycho-ed, the patient discussed her boundaries. At times they have been porous and at other times, too rigid. She agreed that in early recovery one needs to be very careful about being too vulnerable to others in recovery. She noted the friend whom she has stopped talking to since that friend's relapse. As the session came to an end, the group observed a graduation ceremony for this patient. She expressed her appreciation to the group and how grateful she was for the program. She noted that she had met some really great people and she wished the remaining group members that they would find this experience as enriching as she had. They patient leaves [place] with the same sobriety date as she began and has all the knowledge, strategies and tools to remain sober going forward.   UDS collected: No   AA/NA attended?: Yes, Wednesday and Thursday  Sponsor?: Yes   [name], LCAS 01/17/2018 11:28 AM

## 2018-01-18 ENCOUNTER — Encounter (HOSPITAL_COMMUNITY): Payer: Self-pay

## 2018-01-18 NOTE — Progress Notes (Signed)
    Daily Group Progress Note  Program: CD-IOP   01/18/2018 Erlene Quan [id number]  Diagnosis: alcohol Use disorder, severe  Sobriety Date: 10/01/17  Group Time: 1-2:30pm  Participation Level: Active  Behavioral Response: Sharing  Type of Therapy: Process Group  Interventions: Supportive  Topic: Process: The first part of group began with process. Group members shared their experiences in early recovery since we last met on Monday. The Medical Director met with two group members to complete medication checks. All group members were present. No drug tests were collected.   Group Time: 2:30-4pm  Participation Level: Active  Behavioral Response: Sharing  Type of Therapy: Psycho-education Group  Interventions: CBT  Topic: Psycho-ed: REBT Irrational Beliefs; The second half of the group was spent in psycho-ed discussing Rational Emotive Behavior Therapy. A handout was passed out listing 13 common Irrational Beliefs identified by Whitney Post. Time was spent explaining Ellis's A-B-C theory to the group. Group members were encouraged to identify their Irrational Beliefs and use the A-B-C model to practice changing the emotion that follows the irrational thought. Group members provided each other with support and feedback during this intervention.   Summary: The patient reported that she had attended two [place] meetings since we met last Monday. The patient reported that both [place] meetings she attended were on Tuesday and had attended none today because she had a job interview this morning. The patient shared her interview was a "great job interview." The Ivy is also in recovery and she has previously been introduced to him at [employer]. During her job interview, the [place] entered the interview room and stated that he could not recommend her enough. The patient shared that she is "feeling good" but is nervous. She shared that she has a second-round job interview next Tuesday  with [employer]. During the psycho-ed portion of group, the patient identified her irrational belief as "getting a job should be easy for me." The patient identified the origin of this thought stemmed from having a life where jobs have been handed to her in the past without her doing much work. The emotion the patient identified as following this irrational belief is frustration. When the patient changed this thought to "millions of people are struggling with finding a job, I will find one, it will just take time" the new emotion is hopeful.    UDS collected: No  AA/NA attended?: Yes, Monday and Tuesday  Sponsor?: Yes   [name], LCAS 01/18/2018 2:17 PM

## 2018-01-20 ENCOUNTER — Encounter (HOSPITAL_COMMUNITY): Payer: Self-pay

## 2018-01-20 ENCOUNTER — Encounter (HOSPITAL_COMMUNITY): Payer: Self-pay | Admitting: Medical

## 2018-01-20 ENCOUNTER — Other Ambulatory Visit (HOSPITAL_COMMUNITY): Payer: No Typology Code available for payment source | Admitting: Medical
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
## 2018-01-22 ENCOUNTER — Other Ambulatory Visit (HOSPITAL_COMMUNITY): Payer: No Typology Code available for payment source

## 2018-01-23 ENCOUNTER — Other Ambulatory Visit (HOSPITAL_COMMUNITY): Payer: No Typology Code available for payment source

## 2018-01-27 ENCOUNTER — Other Ambulatory Visit (HOSPITAL_COMMUNITY): Payer: No Typology Code available for payment source

## 2018-01-28 ENCOUNTER — Telehealth (HOSPITAL_COMMUNITY): Payer: Self-pay | Admitting: Licensed Clinical Social Worker

## 2018-01-28 NOTE — Telephone Encounter (Signed)
Called to remind pt of Aftercare group tomorrow at 5:30pm.

## 2018-01-29 ENCOUNTER — Other Ambulatory Visit (HOSPITAL_COMMUNITY): Payer: No Typology Code available for payment source

## 2018-01-30 ENCOUNTER — Other Ambulatory Visit (HOSPITAL_COMMUNITY): Payer: No Typology Code available for payment source

## 2018-02-03 ENCOUNTER — Other Ambulatory Visit (HOSPITAL_COMMUNITY): Payer: No Typology Code available for payment source

## 2018-02-05 ENCOUNTER — Ambulatory Visit (INDEPENDENT_AMBULATORY_CARE_PROVIDER_SITE_OTHER): Payer: No Typology Code available for payment source | Admitting: Licensed Clinical Social Worker

## 2018-02-05 ENCOUNTER — Other Ambulatory Visit (HOSPITAL_COMMUNITY): Payer: No Typology Code available for payment source

## 2018-02-05 DIAGNOSIS — F4312 Post-traumatic stress disorder, chronic: Secondary | ICD-10-CM

## 2018-02-05 DIAGNOSIS — F1020 Alcohol dependence, uncomplicated: Secondary | ICD-10-CM

## 2018-02-07 ENCOUNTER — Encounter (HOSPITAL_COMMUNITY): Payer: Self-pay | Admitting: Licensed Clinical Social Worker

## 2018-02-07 NOTE — Progress Notes (Signed)
  Weekly Group Progress Note  Program: OUTPATIENT SKILLS GROUP  Group Time: 5:30-6:30pm  Participation Level: Active  Behavioral Response: Appropriate and Sharing  Type of Therapy:  Psycho-education Group  Skills discussed: Mindfulness   Summary of Progress: Pt was active, engaged in her first Aftercare Group since graduating from CD-IOP 3 weeks ago. He reports she feels a radical positive shift in her mentality and is becoming more aware of her feelings w/o having to act on them.   Summary of Group: Pts were active and engaged in group skill building psychoeducation session. Pts shared about their past week, dysfunctional bx, and attempts at increasing awareness of anger. Pt's rated their current mood state on a 1-10 scale. Counselor led psychoed on using mindfulness skills including breathing, grounding, and coloring to help calm anxiety and dispell depression and/or cravings.    Margo Common, LPCA, LCASA

## 2018-02-20 ENCOUNTER — Ambulatory Visit (HOSPITAL_COMMUNITY): Payer: No Typology Code available for payment source | Admitting: Psychiatry

## 2018-06-01 ENCOUNTER — Emergency Department (HOSPITAL_COMMUNITY)
Admission: EM | Admit: 2018-06-01 | Discharge: 2018-06-02 | Disposition: A | Discharge status: Other Acute Inpatient Hospital | Payer: BLUE CROSS/BLUE SHIELD | Attending: Emergency Medicine | Admitting: Emergency Medicine

## 2018-06-01 ENCOUNTER — Other Ambulatory Visit: Payer: Self-pay

## 2018-06-01 ENCOUNTER — Encounter (HOSPITAL_COMMUNITY): Payer: Self-pay | Admitting: Emergency Medicine

## 2018-06-01 DIAGNOSIS — F332 Major depressive disorder, recurrent severe without psychotic features: Secondary | ICD-10-CM | POA: Diagnosis not present

## 2018-06-01 DIAGNOSIS — R45851 Suicidal ideations: Secondary | ICD-10-CM | POA: Insufficient documentation

## 2018-06-01 DIAGNOSIS — Z046 Encounter for general psychiatric examination, requested by authority: Secondary | ICD-10-CM | POA: Insufficient documentation

## 2018-06-01 DIAGNOSIS — F17210 Nicotine dependence, cigarettes, uncomplicated: Secondary | ICD-10-CM | POA: Diagnosis not present

## 2018-06-01 DIAGNOSIS — F1010 Alcohol abuse, uncomplicated: Secondary | ICD-10-CM

## 2018-06-01 DIAGNOSIS — F419 Anxiety disorder, unspecified: Secondary | ICD-10-CM | POA: Diagnosis not present

## 2018-06-01 DIAGNOSIS — Z79899 Other long term (current) drug therapy: Secondary | ICD-10-CM | POA: Insufficient documentation

## 2018-06-01 DIAGNOSIS — Y908 Blood alcohol level of 240 mg/100 ml or more: Secondary | ICD-10-CM | POA: Diagnosis not present

## 2018-06-01 DIAGNOSIS — F10230 Alcohol dependence with withdrawal, uncomplicated: Secondary | ICD-10-CM | POA: Insufficient documentation

## 2018-06-01 DIAGNOSIS — F329 Major depressive disorder, single episode, unspecified: Secondary | ICD-10-CM | POA: Diagnosis present

## 2018-06-01 LAB — COMPREHENSIVE METABOLIC PANEL
ALT: 16 U/L (ref 0–44)
AST: 25 U/L (ref 15–41)
Albumin: 5.4 g/dL — ABNORMAL HIGH (ref 3.5–5.0)
Alkaline Phosphatase: 57 U/L (ref 38–126)
Anion gap: 8 (ref 5–15)
BUN: 11 mg/dL (ref 6–20)
CO2: 26 mmol/L (ref 22–32)
Calcium: 8.9 mg/dL (ref 8.9–10.3)
Chloride: 109 mmol/L (ref 98–111)
Creatinine, Ser: 0.64 mg/dL (ref 0.44–1.00)
GFR calc Af Amer: >60 mL/min (ref >60)
GFR calc non Af Amer: >60 mL/min (ref >60)
Glucose, Bld: 99 mg/dL (ref 70–99)
Potassium: 3.8 mmol/L (ref 3.5–5.1)
Sodium: 143 mmol/L (ref 135–145)
Total Bilirubin: 0.7 mg/dL (ref 0.3–1.2)
Total Protein: 7.8 g/dL (ref 6.5–8.1)

## 2018-06-01 LAB — RAPID URINE DRUG SCREEN, HOSP PERFORMED
Amphetamines: NOT DETECTED
Barbiturates: NOT DETECTED
Benzodiazepines: NOT DETECTED
Cocaine: NOT DETECTED
Opiates: NOT DETECTED
Tetrahydrocannabinol: NOT DETECTED

## 2018-06-01 LAB — CBC
HCT: 43.5 % (ref 36.0–46.0)
Hemoglobin: 14.5 g/dL (ref 12.0–15.0)
MCH: 33 pg (ref 26.0–34.0)
MCHC: 33.3 g/dL (ref 30.0–36.0)
MCV: 98.9 fL (ref 80.0–100.0)
Platelets: 248 10*3/uL (ref 150–400)
RBC: 4.4 MIL/uL (ref 3.87–5.11)
RDW: 14.4 % (ref 11.5–15.5)
WBC: 9.7 10*3/uL (ref 4.0–10.5)
nRBC: 0 % (ref 0.0–0.2)

## 2018-06-01 LAB — HCG, QUANTITATIVE, PREGNANCY: hCG, Beta Chain, Quant, S: <1 m[IU]/mL (ref <5)

## 2018-06-01 LAB — ETHANOL: Alcohol, Ethyl (B): 356 mg/dL (ref <10)

## 2018-06-01 MED ORDER — LORAZEPAM 2 MG/ML IJ SOLN: 0.0000 mg | Freq: Four times a day (QID) | INTRAMUSCULAR | Status: DC

## 2018-06-01 MED ORDER — LORAZEPAM 2 MG/ML IJ SOLN: 0.0000 mg | Freq: Two times a day (BID) | INTRAMUSCULAR | Status: DC

## 2018-06-01 MED ORDER — VITAMIN B-1 100 MG PO TABS
100.0000 mg | ORAL_TABLET | Freq: Every day | ORAL | Status: DC
  Administered 2018-06-01 – 2018-06-02 (×2): 100 mg via ORAL
  Filled 2018-06-01 (×2): qty 1

## 2018-06-01 MED ORDER — DULOXETINE HCL 30 MG PO CPEP
120.0000 mg | ORAL_CAPSULE | Freq: Every day | ORAL | Status: DC
  Administered 2018-06-01 – 2018-06-02 (×2): 120 mg via ORAL
  Filled 2018-06-01 (×2): qty 4

## 2018-06-01 MED ORDER — TRAZODONE HCL 100 MG PO TABS
100.0000 mg | ORAL_TABLET | Freq: Every evening | ORAL | Status: DC | PRN
  Administered 2018-06-01: 100 mg via ORAL
  Filled 2018-06-01: qty 1

## 2018-06-01 MED ORDER — ONDANSETRON HCL 4 MG PO TABS: 4.0000 mg | ORAL_TABLET | Freq: Three times a day (TID) | ORAL | Status: DC | PRN

## 2018-06-01 MED ORDER — LORAZEPAM 1 MG PO TABS: 0.0000 mg | ORAL_TABLET | Freq: Two times a day (BID) | ORAL | Status: DC

## 2018-06-01 MED ORDER — GABAPENTIN 300 MG PO CAPS
300.0000 mg | ORAL_CAPSULE | Freq: Three times a day (TID) | ORAL | Status: DC
  Administered 2018-06-01 – 2018-06-02 (×3): 300 mg via ORAL
  Filled 2018-06-01 (×4): qty 1

## 2018-06-01 MED ORDER — THIAMINE HCL 100 MG/ML IJ SOLN: 100.0000 mg | Freq: Every day | INTRAMUSCULAR | Status: DC

## 2018-06-01 MED ORDER — LORAZEPAM 1 MG PO TABS
0.0000 mg | ORAL_TABLET | Freq: Four times a day (QID) | ORAL | Status: DC
  Administered 2018-06-01 – 2018-06-02 (×2): 1 mg via ORAL
  Filled 2018-06-01 (×2): qty 1

## 2018-06-01 NOTE — Progress Notes (Signed)
Nira Conn, NP recommends inpt treatment. EDP Dr. Juleen China, MD and pt's nurse Reita Cliche, RN have been advised. [facility] to review for possible admission pending UDS and labs.  Princess Bruins, MSW, LCSW Therapeutic Triage Specialist  [phone]'
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
## 2018-06-01 NOTE — ED Notes (Signed)
Dr Juleen China made aware of blood etoh level
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
## 2018-06-02 ENCOUNTER — Encounter (HOSPITAL_COMMUNITY): Payer: Self-pay | Admitting: *Deleted

## 2018-06-02 ENCOUNTER — Other Ambulatory Visit: Payer: Self-pay

## 2018-06-02 ENCOUNTER — Inpatient Hospital Stay (HOSPITAL_COMMUNITY)
Admission: AD | Admit: 2018-06-02 | Discharge: 2018-06-05 | DRG: 885 | Disposition: A | Discharge status: Home / Self Care | Payer: BLUE CROSS/BLUE SHIELD | Source: Intra-hospital | Attending: Psychiatry | Admitting: Psychiatry

## 2018-06-02 DIAGNOSIS — F17210 Nicotine dependence, cigarettes, uncomplicated: Secondary | ICD-10-CM | POA: Diagnosis present

## 2018-06-02 DIAGNOSIS — F4310 Post-traumatic stress disorder, unspecified: Secondary | ICD-10-CM | POA: Diagnosis present

## 2018-06-02 DIAGNOSIS — Z975 Presence of (intrauterine) contraceptive device: Secondary | ICD-10-CM | POA: Diagnosis not present

## 2018-06-02 DIAGNOSIS — Z793 Long term (current) use of hormonal contraceptives: Secondary | ICD-10-CM | POA: Diagnosis not present

## 2018-06-02 DIAGNOSIS — Z79899 Other long term (current) drug therapy: Secondary | ICD-10-CM | POA: Diagnosis not present

## 2018-06-02 DIAGNOSIS — F10229 Alcohol dependence with intoxication, unspecified: Secondary | ICD-10-CM | POA: Diagnosis present

## 2018-06-02 DIAGNOSIS — F332 Major depressive disorder, recurrent severe without psychotic features: Principal | ICD-10-CM | POA: Diagnosis present

## 2018-06-02 DIAGNOSIS — R45851 Suicidal ideations: Secondary | ICD-10-CM | POA: Diagnosis present

## 2018-06-02 DIAGNOSIS — F411 Generalized anxiety disorder: Secondary | ICD-10-CM | POA: Diagnosis present

## 2018-06-02 MED ORDER — LORAZEPAM 1 MG PO TABS
0.0000 mg | ORAL_TABLET | Freq: Four times a day (QID) | ORAL | Status: DC
  Administered 2018-06-02: 2 mg via ORAL
  Filled 2018-06-02: qty 2

## 2018-06-02 MED ORDER — THIAMINE HCL 100 MG/ML IJ SOLN: 100.0000 mg | Freq: Every day | INTRAMUSCULAR | Status: DC

## 2018-06-02 MED ORDER — LORAZEPAM 2 MG/ML IJ SOLN: 0.0000 mg | Freq: Four times a day (QID) | INTRAMUSCULAR | Status: DC

## 2018-06-02 MED ORDER — MAGNESIUM HYDROXIDE 400 MG/5ML PO SUSP: 30.0000 mL | Freq: Every day | ORAL | Status: DC | PRN

## 2018-06-02 MED ORDER — ACETAMINOPHEN 325 MG PO TABS: 650.0000 mg | ORAL_TABLET | Freq: Four times a day (QID) | ORAL | Status: DC | PRN

## 2018-06-02 MED ORDER — TRAZODONE HCL 100 MG PO TABS
100.0000 mg | ORAL_TABLET | Freq: Every evening | ORAL | Status: DC | PRN
  Administered 2018-06-02 – 2018-06-04 (×3): 100 mg via ORAL
  Filled 2018-06-02: qty 2
  Filled 2018-06-02 (×2): qty 1

## 2018-06-02 MED ORDER — GABAPENTIN 300 MG PO CAPS
300.0000 mg | ORAL_CAPSULE | Freq: Three times a day (TID) | ORAL | Status: DC
  Administered 2018-06-03 – 2018-06-04 (×6): 300 mg via ORAL
  Filled 2018-06-02 (×11): qty 1

## 2018-06-02 MED ORDER — DULOXETINE HCL 60 MG PO CPEP
120.0000 mg | ORAL_CAPSULE | Freq: Every day | ORAL | Status: DC
  Administered 2018-06-03 – 2018-06-04 (×2): 120 mg via ORAL
  Filled 2018-06-02 (×5): qty 2

## 2018-06-02 MED ORDER — NICOTINE POLACRILEX 2 MG MT GUM
2.0000 mg | CHEWING_GUM | OROMUCOSAL | Status: DC | PRN
  Administered 2018-06-03 – 2018-06-05 (×3): 2 mg via ORAL

## 2018-06-02 MED ORDER — LORAZEPAM 1 MG PO TABS: 0.0000 mg | ORAL_TABLET | Freq: Two times a day (BID) | ORAL | Status: DC

## 2018-06-02 MED ORDER — ONDANSETRON HCL 4 MG PO TABS: 4.0000 mg | ORAL_TABLET | Freq: Three times a day (TID) | ORAL | Status: DC | PRN

## 2018-06-02 MED ORDER — VITAMIN B-1 100 MG PO TABS
100.0000 mg | ORAL_TABLET | Freq: Every day | ORAL | Status: DC
  Administered 2018-06-03 – 2018-06-04 (×2): 100 mg via ORAL
  Filled 2018-06-02 (×5): qty 1

## 2018-06-02 MED ORDER — ALUM & MAG HYDROXIDE-SIMETH 200-200-20 MG/5ML PO SUSP: 30.0000 mL | ORAL | Status: DC | PRN

## 2018-06-02 MED ORDER — LORAZEPAM 2 MG/ML IJ SOLN: 0.0000 mg | Freq: Two times a day (BID) | INTRAMUSCULAR | Status: DC

## 2018-06-02 NOTE — ED Notes (Signed)
Provided patient 2 sticks of cheese and 3 packs of saltine crackers.

## 2018-06-02 NOTE — ED Notes (Signed)
Provided patient magazines to read at her request, since she can not have her hardback book. Also, turned her television off at her request.

## 2018-06-02 NOTE — ED Notes (Signed)
Attempted to call report to [facility], but they are still in report and will call back when available.
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
## 2018-06-02 NOTE — BH Assessment (Signed)
[facility] Assessment Progress Note  Per Juanetta Beets, DO, this pt requires psychiatric hospitalization.  Lamount Cranker, RN, [facility] has assigned pt to [facility] Rm [id number]; [facility] will be ready to receive pt at 19:00.  Pt presents under IVC initiated by pt's father, and upheld by Dr Sharma Covert, and IVC documents have been faxed to [facility].  Pt's nurse has been notified, and agrees to call report to [phone].  Pt is to be transported via [employer].   Doylene Canning, [place] Behavioral Health Coordinator [phone]

## 2018-06-02 NOTE — Progress Notes (Signed)
Dawn Jackson is a 48 year old female pt admitted on involuntary basis. On admission, she reports that her father had her committed because she recently relapsed on alcohol and has been feeling depressed and has made suicidal comments. She does endorse that she made a suicidal comment but denies current SI and is able to contract for safety while in the hospital. She reports that she relapsed and was on a 4 day binge of alcohol. Dawn Jackson presents mildly anxious on admission but does not display any overt signs or symptoms of withdrawal. She denies any other substance use. She reports that while she does not have a provider she does report that she received a supply of medications that she is currently using and reports that she has been taking them as she should. She reports that she was seeing Dawn Jackson previously. She also reports that she lives alone and will be able to return to the same place upon discharge. Dawn Jackson was escorted to the unit, oriented to the milieu and safety maintained.

## 2018-06-02 NOTE — ED Notes (Signed)
Pt sleeping soundly no distress noted will perform 0400 CIWA at 0600 with AM vital signs.
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
## 2018-06-02 NOTE — ED Notes (Signed)
Bed: WBH34 Expected date:  Expected time:  Means of arrival:  Comments: 

## 2018-06-02 NOTE — Tx Team (Signed)
Initial Treatment Plan 06/02/2018 11:09 PM Dawn Jackson MM[facility]:[id number]    PATIENT STRESSORS: Substance abuse   PATIENT STRENGTHS: Ability for insight Average or above average intelligence Capable of independent living General fund of knowledge Motivation for treatment/growth   PATIENT IDENTIFIED PROBLEMS: Depression Suicidal thoughts Alcohol abuse "Detox me as quickly as possible"                     DISCHARGE CRITERIA:  Ability to meet basic life and health needs Improved stabilization in mood, thinking, and/or behavior Reduction of life-threatening or endangering symptoms to within safe limits Verbal commitment to aftercare and medication compliance  PRELIMINARY DISCHARGE PLAN: Attend aftercare/continuing care group Return to previous living arrangement  PATIENT/FAMILY INVOLVEMENT: This treatment plan has been presented to and reviewed with the patient, Dawn Jackson, and/or family member, .  The patient and family have been given the opportunity to ask questions and make suggestions.  [name], [place], [place] 06/02/2018, 11:09 PM

## 2018-06-03 DIAGNOSIS — F332 Major depressive disorder, recurrent severe without psychotic features: Principal | ICD-10-CM

## 2018-06-03 MED ORDER — CHLORDIAZEPOXIDE HCL 25 MG PO CAPS: 25.0000 mg | ORAL_CAPSULE | Freq: Every day | ORAL | Status: DC

## 2018-06-03 MED ORDER — CHLORDIAZEPOXIDE HCL 25 MG PO CAPS: 25.0000 mg | ORAL_CAPSULE | ORAL | Status: DC

## 2018-06-03 MED ORDER — CHLORDIAZEPOXIDE HCL 25 MG PO CAPS
25.0000 mg | ORAL_CAPSULE | Freq: Three times a day (TID) | ORAL | Status: AC
  Administered 2018-06-04 (×3): 25 mg via ORAL
  Filled 2018-06-03 (×3): qty 1

## 2018-06-03 MED ORDER — CHLORDIAZEPOXIDE HCL 25 MG PO CAPS
25.0000 mg | ORAL_CAPSULE | Freq: Four times a day (QID) | ORAL | Status: DC | PRN
  Administered 2018-06-04: 25 mg via ORAL
  Filled 2018-06-03: qty 1

## 2018-06-03 MED ORDER — CHLORDIAZEPOXIDE HCL 25 MG PO CAPS
25.0000 mg | ORAL_CAPSULE | Freq: Four times a day (QID) | ORAL | Status: AC
  Administered 2018-06-03 (×4): 25 mg via ORAL
  Filled 2018-06-03 (×3): qty 1

## 2018-06-03 NOTE — Progress Notes (Signed)
Nursing Progress Note: 7p-7a D: Pt currently presents with a depressed/anxious affect and behavior. Pt states "I'm feeling much better than I did. A lot less withdrawal symptoms today." Interacting minimally with the milieu. Pt reports good sleep during the previous night with current medication regimen. Pt did not attend wrap-up group.  A: Pt provided with medications per providers orders. Pt's labs and vitals were monitored throughout the night. Pt supported emotionally and encouraged to express concerns and questions. Pt educated on medications.  R: Pt's safety ensured with 15 minute and environmental checks. Pt currently denies SI, HI, and AVH. Pt verbally contracts to seek staff if SI,HI, or AVH occurs and to consult with staff before acting on any harmful thoughts. Will continue to monitor.

## 2018-06-03 NOTE — [facility] Group Notes (Signed)
[facility] Group Therapy 06/03/2018 1:15pm  Type of Therapy: [facility] Presentation  Participation Level: Pt invited. DID NOT ATTEND. Pt chose to remain in bed to rest.    Rona Ravens, LCSW 06/03/2018 1:55 PM
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
## 2018-06-03 NOTE — Progress Notes (Signed)
Recreation Therapy Notes  Animal-Assisted Activity (AAA) Program Checklist/Progress Notes Patient Eligibility Criteria Checklist & Daily Group note for Rec Tx Intervention  Date: 2.18. 20 Time: 1430 Location: 400 Hall Dayroom  AAA/T Program Assumption of Risk Form signed by Patient/ or Parent Legal Guardian  YES   Patient is free of allergies or sever asthma   YES   Patient reports no fear of animals  YES   Patient reports no history of cruelty to animals  YES   Patient understands his/her participation is voluntary  YES   Patient washes hands before animal contact  YES  Patient washes hands after animal contact  YES   Education: Charity fundraiser, Appropriate Animal Interaction   Education Outcome: Acknowledges understanding/In group clarification offered/Needs additional education.   Clinical Observations/Feedback: Pt did not attend group activity.    Caroll Rancher, LRT/CTRS         Lillia Abed, Kanaya Gunnarson A 06/03/2018 3:12 PM
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
## 2018-06-03 NOTE — Progress Notes (Signed)
Patient did not attend wrap up group. 

## 2018-06-04 ENCOUNTER — Ambulatory Visit (HOSPITAL_COMMUNITY): Payer: Self-pay

## 2018-06-04 MED ORDER — ACAMPROSATE CALCIUM 333 MG PO TBEC
666.0000 mg | DELAYED_RELEASE_TABLET | Freq: Three times a day (TID) | ORAL | Status: DC
  Administered 2018-06-04 – 2018-06-05 (×3): 666 mg via ORAL
  Filled 2018-06-04 (×6): qty 2

## 2018-06-04 NOTE — Plan of Care (Signed)
Patient was anxious upon approach. Patient said "I really need to get out of here, the doctor said he would let me go early tomorrow so I can get back to work." Denies SI HI AVH. Denies physical pain. Patient is compliant with medications prescribed by provider. Safety is maintained with 15 minute checks as well as environmental checks. Will continue to monitor.  Problem: Education: Goal: Emotional status will improve Outcome: Not Progressing Goal: Mental status will improve Outcome: Not Progressing Goal: Verbalization of understanding the information provided will improve Outcome: Not Progressing   Problem: Activity: Goal: Interest or engagement in activities will improve Outcome: Not Progressing Goal: Sleeping patterns will improve Outcome: Not Progressing

## 2018-06-04 NOTE — Patient Outreach (Signed)
CPSS met with the patient at [facility] at 4:30pm 06/04/18 in order to provide substance use recovery support with lived reccovery experience from [facility] as well as provide help with substance use treatment resources. CPSS was consulted to meet with the patient at the [facility] on 06/02/18. Patient recently relapsed on alcohol, having experienced four days of binge alcohol use. Patient had achieved 7 months of continuous sobriety before this relapse. Patient plans to follow up with her home AA 12-step group, [facility] sponsor, and two close friends in [place] as well as return to work after discharge from [facility] CPSS provided the patient with an outpatient substance use treatment center list that included contact information for the [facility]. Patient completed the [facility] in 01/2018. CPSS also provided the patient with CPSS contact information. CPSS strongly encouraged the patient to follow up with CPSS if needed for further help with getting connected to substance use recovery resources. Patient was pleasant and expressed gratitude for CPSS services.

## 2018-06-04 NOTE — Progress Notes (Signed)
Recreation Therapy Notes  Date:  2.19. 20 Time: 0930 Location: 300 Hall Dayroom  Group Topic: Stress Management  Goal Area(s) Addresses:  Patient will identify positive stress management techniques. Patient will identify benefits of using stress management post d/c.  Intervention: Stress Management  Activity :  Meditation.  LRT introduced the stress management technique of meditation.  LRT played a meditation that focused on pure possibilities.  Patients were to listen and follow along as meditation played to engage in activity.    Education:  Stress Management, Discharge Planning.   Education Outcome: Acknowledges Education  Clinical Observations/Feedback: Pt did not attend group.     Caroll Rancher, LRT/CTRS          Lillia Abed, Johnatha Zeidman A 06/04/2018 11:11 AM

## 2018-06-04 NOTE — Progress Notes (Signed)
Pt attend wrap up group. 
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
## 2018-06-04 NOTE — Progress Notes (Signed)
Nursing Progress Note: 7p-7a D: Pt currently presents with a depressed/anxious/thoughtblocking affect and behavior. Pt states "I am worried about leaving here tomorrow. I need to get to work." Interacting appropriately with the milieu. Pt reports good sleep during the previous night with current medication regimen. Pt did not attend wrap-up group.  A: Pt provided with medications per providers orders. Pt's labs and vitals were monitored throughout the night. Pt supported emotionally and encouraged to express concerns and questions. Pt educated on medications.  R: Pt's safety ensured with 15 minute and environmental checks. Pt currently denies SI, HI, and AVH. Pt verbally contracts to seek staff if SI,HI, or AVH occurs and to consult with staff before acting on any harmful thoughts. Will continue to monitor.
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
## 2018-06-04 NOTE — Tx Team (Signed)
Interdisciplinary Treatment and Diagnostic Plan Update  06/04/2018 Time of Session: [id number] Dawn Jackson MRN: [id number]  Principal Diagnosis: MDD, recurrent, severe, without psychotic features  Secondary Diagnoses: Active Problems:   Major depressive disorder, recurrent severe without psychotic features (HCC)   Current Medications:  Current Facility-Administered Medications  Medication Dose Route Frequency Provider Last Rate Last Dose  . acetaminophen (TYLENOL) tablet 650 mg  650 mg Oral Q6H PRN Charm Rings, NP      . alum & mag hydroxide-simeth (MAALOX/MYLANTA) 200-200-20 MG/5ML suspension 30 mL  30 mL Oral Q4H PRN Charm Rings, NP      . chlordiazePOXIDE (LIBRIUM) capsule 25 mg  25 mg Oral TID Malvin Johns, MD   25 mg at 06/04/18 0804   Followed by  . [START ON 06/05/2018] chlordiazePOXIDE (LIBRIUM) capsule 25 mg  25 mg Oral Nicki Guadalajara, MD       Followed by  . [START ON 06/06/2018] chlordiazePOXIDE (LIBRIUM) capsule 25 mg  25 mg Oral Daily Malvin Johns, MD      . chlordiazePOXIDE (LIBRIUM) capsule 25 mg  25 mg Oral Q6H PRN Malvin Johns, MD      . DULoxetine (CYMBALTA) DR capsule 120 mg  120 mg Oral Daily Charm Rings, NP   120 mg at 06/04/18 0804  . gabapentin (NEURONTIN) capsule 300 mg  300 mg Oral TID Charm Rings, NP   300 mg at 06/04/18 0804  . magnesium hydroxide (MILK OF MAGNESIA) suspension 30 mL  30 mL Oral Daily PRN Charm Rings, NP      . nicotine polacrilex (NICORETTE) gum 2 mg  2 mg Oral PRN Cobos, Rockey Situ, MD   2 mg at 06/04/18 0804  . ondansetron (ZOFRAN) tablet 4 mg  4 mg Oral Q8H PRN Charm Rings, NP      . thiamine (VITAMIN B-1) tablet 100 mg  100 mg Oral Daily Charm Rings, NP   100 mg at 06/04/18 7989   Or  . thiamine (B-1) injection 100 mg  100 mg Intravenous Daily Charm Rings, NP      . traZODone (DESYREL) tablet 100 mg  100 mg Oral QHS PRN Charm Rings, NP   100 mg at 06/03/18 2203   PTA Medications: Medications Prior  to Admission  Medication Sig Dispense Refill Last Dose  . DULoxetine (CYMBALTA) 60 MG capsule Take 2 capsules (120 mg total) by mouth daily. For mood control 180 capsule 1 06/01/2018 at Unknown time  . gabapentin (NEURONTIN) 300 MG capsule Take 1 capsule (300 mg total) by mouth 3 (three) times daily. (Patient taking differently: Take 600 mg by mouth at bedtime. ) 270 capsule 1 Past Week at Unknown time  . ibuprofen (ADVIL,MOTRIN) 800 MG tablet Take 1 tablet (800 mg total) by mouth every 8 (eight) hours as needed. 30 tablet 0 06/01/2018 at Unknown time  . levonorgestrel (MIRENA) 20 MCG/24HR IUD 1 each by Intrauterine route once.     . traZODone (DESYREL) 100 MG tablet Take 1 tablet (100 mg total) by mouth at bedtime as needed for sleep. 90 tablet 1 05/31/2018 at Unknown time    Patient Stressors: Substance abuse  Patient Strengths: Ability for insight Average or above average intelligence Capable of independent living [employer] of knowledge Motivation for treatment/growth  Treatment Modalities: Medication Management, Group therapy, Case management,  1 to 1 session with clinician, Psychoeducation, Recreational therapy.   Physician Treatment Plan for Primary Diagnosis: MDD, recurrent, severe, without  psychotic features Long Term Goal(s): Improvement in symptoms so as ready for discharge Improvement in symptoms so as ready for discharge   Short Term Goals: Ability to verbalize feelings will improve Ability to maintain clinical measurements within normal limits will improve Ability to identify triggers associated with substance abuse/mental health issues will improve  Medication Management: Evaluate patient's response, side effects, and tolerance of medication regimen.  Therapeutic Interventions: 1 to 1 sessions, Unit Group sessions and Medication administration.  Evaluation of Outcomes: Progressing  Physician Treatment Plan for Secondary Diagnosis: Active Problems:   Major depressive  disorder, recurrent severe without psychotic features (HCC)  Long Term Goal(s): Improvement in symptoms so as ready for discharge Improvement in symptoms so as ready for discharge   Short Term Goals: Ability to verbalize feelings will improve Ability to maintain clinical measurements within normal limits will improve Ability to identify triggers associated with substance abuse/mental health issues will improve     Medication Management: Evaluate patient's response, side effects, and tolerance of medication regimen.  Therapeutic Interventions: 1 to 1 sessions, Unit Group sessions and Medication administration.  Evaluation of Outcomes: Progressing   RN Treatment Plan for Primary Diagnosis: MDD, recurrent, severe, without psychotic features Long Term Goal(s): Knowledge of disease and therapeutic regimen to maintain health will improve  Short Term Goals: Ability to remain free from injury will improve, Ability to verbalize frustration and anger appropriately will improve, Ability to demonstrate self-control and Ability to disclose and discuss suicidal ideas  Medication Management: RN will administer medications as ordered by provider, will assess and evaluate patient's response and provide education to patient for prescribed medication. RN will report any adverse and/or side effects to prescribing provider.  Therapeutic Interventions: 1 on 1 counseling sessions, Psychoeducation, Medication administration, Evaluate responses to treatment, Monitor vital signs and CBGs as ordered, Perform/monitor CIWA, COWS, AIMS and Fall Risk screenings as ordered, Perform wound care treatments as ordered.  Evaluation of Outcomes: Progressing   LCSW Treatment Plan for Primary Diagnosis: MDD, recurrent, severe, without psychotic features Long Term Goal(s): Safe transition to appropriate next level of care at discharge, Engage patient in therapeutic group addressing interpersonal concerns.  Short Term Goals:  Engage patient in aftercare planning with referrals and resources, Facilitate patient progression through stages of change regarding substance use diagnoses and concerns and Identify triggers associated with mental health/substance abuse issues  Therapeutic Interventions: Assess for all discharge needs, 1 to 1 time with Social worker, Explore available resources and support systems, Assess for adequacy in community support network, Educate family and significant other(s) on suicide prevention, Complete Psychosocial Assessment, Interpersonal group therapy.  Evaluation of Outcomes: Progressing   Progress in Treatment: Attending groups: Yes. Participating in groups: Yes. Taking medication as prescribed: Yes. Toleration medication: Yes. Family/Significant other contact made: No, will contact:  pt's husband with pt consents Patient understands diagnosis: Yes. Discussing patient identified problems/goals with staff: Yes. Medical problems stabilized or resolved: Yes. Denies suicidal/homicidal ideation: Yes. Issues/concerns per patient self-inventory: No. Other: n/a   New problem(s) identified: No, Describe:  n/a  New Short Term/Long Term Goal(s): detox, medication management for mood stabilization; elimination of SI thoughts; development of comprehensive mental wellness/sobriety plan.   Patient Goals:  "to detox and get back to work."   Discharge Plan or Barriers: CSW assessing-pt plans to return home and would like to resume outpatient medication management with Dr. Maggie [facility]. Pt declining outpatient counseling or SAIOP. MHAG pamphlet, Mobile Crisis information, and AA/NA information provided to patient for additional community support and  resources.   Reason for Continuation of Hospitalization: Anxiety Depression Medication stabilization Suicidal ideation Withdrawal symptoms  Estimated Length of Stay: Thursday, 06/05/2018  Attendees: Patient: Dawn Jackson 06/04/2018 9:16 AM  Physician: Dr.  Jeannine Kitten MD 06/04/2018 9:16 AM  Nursing: Arlyss Repress RN; [facility] RN 06/04/2018 9:16 AM  RN Care Manager:x 06/04/2018 9:16 AM  Social Worker: Corrie Mckusick LCSW 06/04/2018 9:16 AM  Recreational Therapist: x 06/04/2018 9:16 AM  Other: Armandina Stammer NP; Marciano Sequin NP 06/04/2018 9:16 AM  Other:  06/04/2018 9:16 AM  Other: 06/04/2018 9:16 AM    Scribe for Treatment Team: Rona Ravens, LCSW 06/04/2018 9:16 AM

## 2018-06-05 MED ORDER — ACAMPROSATE CALCIUM 333 MG PO TBEC: 666.0000 mg | DELAYED_RELEASE_TABLET | Freq: Three times a day (TID) | ORAL | 1 refills | Status: DC

## 2018-06-05 MED ORDER — DULOXETINE HCL 60 MG PO CPEP: 60.0000 mg | ORAL_CAPSULE | Freq: Two times a day (BID) | ORAL | 3 refills | Status: DC

## 2018-06-05 NOTE — Progress Notes (Signed)
  [facility] Adult Case Management Discharge Plan :  Will you be returning to the same living situation after discharge:  Yes,  home At discharge, do you have transportation home?: Yes,  husband Do you have the ability to pay for your medications: Yes,  private insurance  Release of information consent forms completed and submitted to medical records by CSW.   Patient to Follow up at: Follow-up Information    [facility] Follow up on 06/09/2018.   Why:  Hospital follow-up appt with Dr. Maggie Schwalbe on Monday, 2/24 at 3:00PM. Please bring your hospital discharge paperwork to this appt. Thank you.  Contact information: [address] [place], [place] [address] Phone: [phone] Fax: [phone]       Patient declined additional referrals for therapy or intensive outpatient group. Follow up.           Next level of care provider has access to [facility] Link:no  Safety Planning and Suicide Prevention discussed: Yes,  SPE completed with pt and her husband. SPI pamphlet and mobile crisis information provided to pt.   Have you used any form of tobacco in the last 30 days? (Cigarettes, Smokeless Tobacco, Cigars, and/or Pipes): Yes  Has patient been referred to the Quitline?: Patient refused referral  Patient has been referred for addiction treatment: Yes  Rona Ravens, LCSW 06/05/2018, 8:22 AM

## 2018-06-05 NOTE — Plan of Care (Signed)
Discharge note  Patient verbalizes readiness for discharge. Follow up plan explained, AVS, Transition record and SRA given. Prescriptions and teaching provided. Belongings returned and signed for. Suicide safety plan completed and signed. Patient verbalizes understanding. 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 Patient discharged to lobby where sister was waiting to take her to work.  Problem: Education: Goal: Knowledge of [facility] Education information/materials will improve Outcome: Adequate for Discharge Goal: Emotional status will improve Outcome: Adequate for Discharge Goal: Mental status will improve Outcome: Adequate for Discharge Goal: Verbalization of understanding the information provided will improve Outcome: Adequate for Discharge   Problem: Activity: Goal: Interest or engagement in activities will improve Outcome: Adequate for Discharge Goal: Sleeping patterns will improve Outcome: Adequate for Discharge   Problem: Coping: Goal: Ability to verbalize frustrations and anger appropriately will improve Outcome: Adequate for Discharge Goal: Ability to demonstrate self-control will improve Outcome: Adequate for Discharge   Problem: Health Behavior/Discharge Planning: Goal: Identification of resources available to assist in meeting health care needs will improve Outcome: Adequate for Discharge Goal: Compliance with treatment plan for underlying cause of condition will improve Outcome: Adequate for Discharge   Problem: Physical Regulation: Goal: Ability to maintain clinical measurements within normal limits will improve Outcome: Adequate for Discharge   Problem: Safety: Goal: Periods of time without injury will increase Outcome: Adequate for Discharge   Problem: Education: Goal: Utilization of techniques to improve thought processes will improve Outcome: Adequate for Discharge Goal: Knowledge of the prescribed  therapeutic regimen will improve Outcome: Adequate for Discharge   Problem: Activity: Goal: Interest or engagement in leisure activities will improve Outcome: Adequate for Discharge Goal: Imbalance in normal sleep/wake cycle will improve Outcome: Adequate for Discharge   Problem: Coping: Goal: Coping ability will improve Outcome: Adequate for Discharge Goal: Will verbalize feelings Outcome: Adequate for Discharge   Problem: Health Behavior/Discharge Planning: Goal: Ability to make decisions will improve Outcome: Adequate for Discharge Goal: Compliance with therapeutic regimen will improve Outcome: Adequate for Discharge   Problem: Role Relationship: Goal: Will demonstrate positive changes in social behaviors and relationships Outcome: Adequate for Discharge   Problem: Safety: Goal: Ability to disclose and discuss suicidal ideas will improve Outcome: Adequate for Discharge Goal: Ability to identify and utilize support systems that promote safety will improve Outcome: Adequate for Discharge   Problem: Self-Concept: Goal: Will verbalize positive feelings about self Outcome: Adequate for Discharge Goal: Level of anxiety will decrease Outcome: Adequate for Discharge   Problem: Education: Goal: Ability to make informed decisions regarding treatment will improve Outcome: Adequate for Discharge   Problem: Coping: Goal: Coping ability will improve Outcome: Adequate for Discharge   Problem: Health Behavior/Discharge Planning: Goal: Identification of resources available to assist in meeting health care needs will improve Outcome: Adequate for Discharge   Problem: Medication: Goal: Compliance with prescribed medication regimen will improve Outcome: Adequate for Discharge   Problem: Self-Concept: Goal: Ability to disclose and discuss suicidal ideas will improve Outcome: Adequate for Discharge Goal: Will verbalize positive feelings about self Outcome: Adequate for  Discharge   Problem: Education: Goal: Knowledge of disease or condition will improve Outcome: Adequate for Discharge Goal: Understanding of discharge needs will improve Outcome: Adequate for Discharge   Problem: Health Behavior/Discharge Planning: Goal: Ability to identify changes in lifestyle to reduce recurrence of condition will improve Outcome: Adequate for Discharge Goal: Identification of resources available to assist in meeting health care needs will improve Outcome: Adequate for Discharge  Problem: Physical Regulation: Goal: Complications related to the disease process, condition or treatment will be avoided or minimized Outcome: Adequate for Discharge   Problem: Safety: Goal: Ability to remain free from injury will improve Outcome: Adequate for Discharge   Problem: Education: Goal: Knowledge of General Education information will improve Description Including pain rating scale, medication(s)/side effects and non-pharmacologic comfort measures Outcome: Adequate for Discharge   Problem: Health Behavior/Discharge Planning: Goal: Ability to manage health-related needs will improve Outcome: Adequate for Discharge   Problem: Coping: Goal: Level of anxiety will decrease Outcome: Adequate for Discharge   Problem: Safety: Goal: Ability to remain free from injury will improve Outcome: Adequate for Discharge

## 2018-06-05 NOTE — [facility] Suicide Risk Assessment (Signed)
[facility] Discharge Suicide Risk Assessment   Principal Problem: Alcoholism/depression Discharge Diagnoses: Active Problems:   Major depressive disorder, recurrent severe without psychotic features (HCC)   Total Time spent with patient: 45 minutes  Currently alert oriented without thoughts of harming self or others no cravings tremors or withdrawal Mental Status Per Nursing Assessment::   On Admission:  Self-harm thoughts  Demographic Factors:  Caucasian  Loss Factors: NA  Historical Factors: Impulsivity  Risk Reduction Factors:   Employed  Continued Clinical Symptoms:  Alcohol/Substance Abuse/Dependencies  Cognitive Features That Contribute To Risk:  None    Suicide Risk:  Minimal: No identifiable suicidal ideation.  Patients presenting with no risk factors but with morbid ruminations; may be classified as minimal risk based on the severity of the depressive symptoms  Follow-up Information    [facility] Follow up on 06/09/2018.   Why:  Hospital follow-up appt with Dr. Maggie Schwalbe on Monday, 2/24 at 3:00PM. Please bring your hospital discharge paperwork to this appt. Thank you.  Contact information: [address] [place], [place] [address] Phone: [phone] Fax: [phone]       Patient declined additional referrals for therapy or intensive outpatient group. Follow up.           Plan Of Care/Follow-up recommendations:  Activity:  full  Keniyah Gelinas, MD 06/05/2018, 7:21 AM
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
## 2018-07-15 MED FILL — hydrOXYzine HCL 50 MG TABS: 50 | 15 days supply | Qty: 90 | Fill #1

## 2018-10-13 ENCOUNTER — Other Ambulatory Visit: Payer: Self-pay | Admitting: Internal Medicine

## 2018-10-13 DIAGNOSIS — Z1231 Encounter for screening mammogram for malignant neoplasm of breast: Secondary | ICD-10-CM

## 2018-11-25 ENCOUNTER — Ambulatory Visit
Admission: RE | Admit: 2018-11-25 | Discharge: 2018-11-25 | Disposition: A | Discharge status: Home / Self Care | Payer: BC Managed Care – PPO | Source: Ambulatory Visit | Attending: Internal Medicine | Admitting: Internal Medicine

## 2018-11-25 ENCOUNTER — Other Ambulatory Visit: Payer: Self-pay

## 2018-11-25 DIAGNOSIS — Z1231 Encounter for screening mammogram for malignant neoplasm of breast: Secondary | ICD-10-CM

## 2018-11-25 IMAGING — MG DIGITAL SCREENING BILATERAL MAMMOGRAM WITH TOMO AND CAD
8 series · 9 of 24 positions shown · non-contrast
Comparison: Previous exam(s).

CLINICAL DATA: Screening.

EXAM:
DIGITAL SCREENING BILATERAL MAMMOGRAM WITH TOMO AND CAD

[R CC synth-2D]
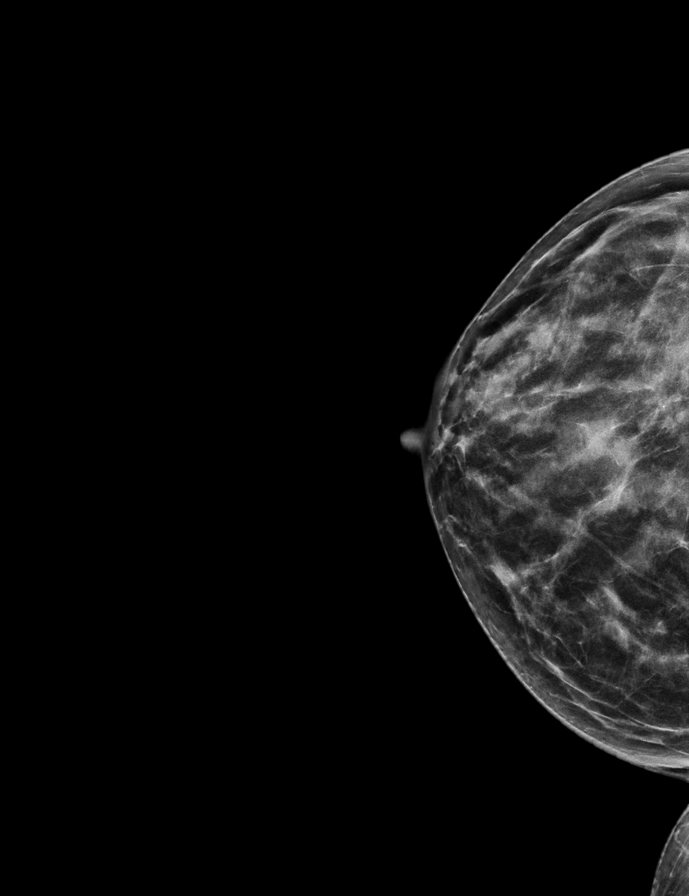

[L CC synth-2D]
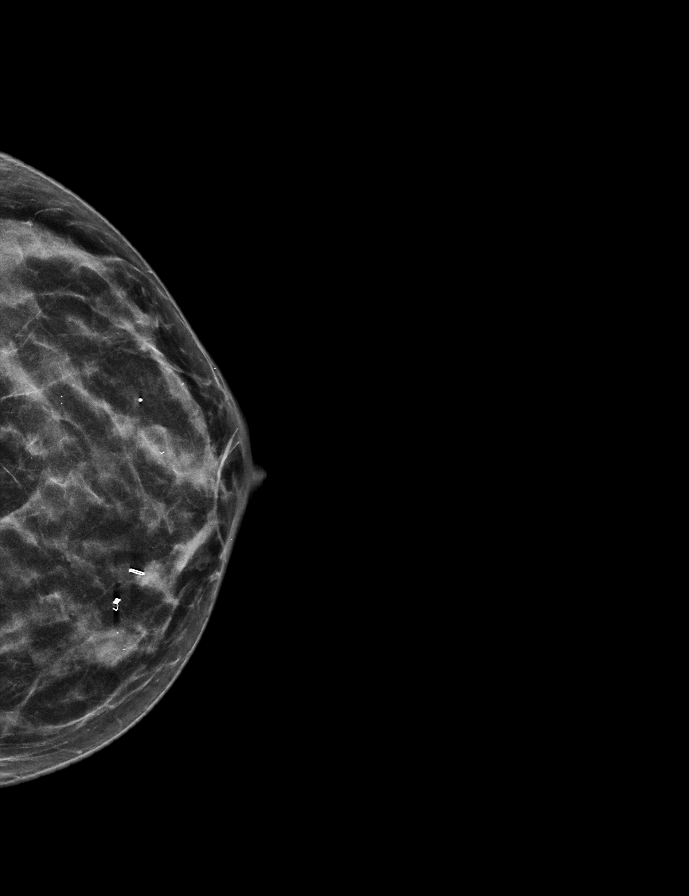

[L MLO synth-2D]
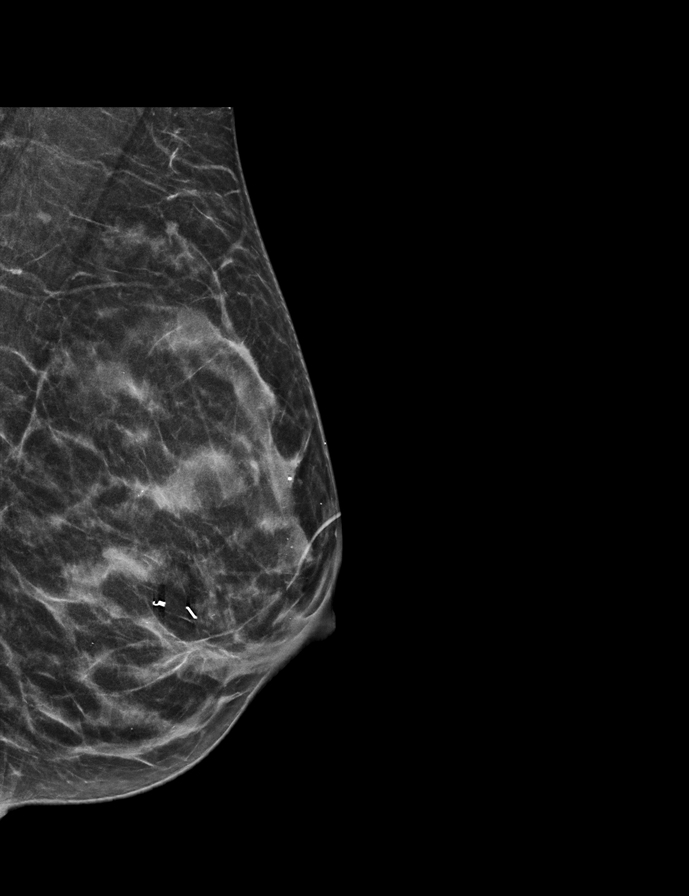

[R MLO synth-2D]
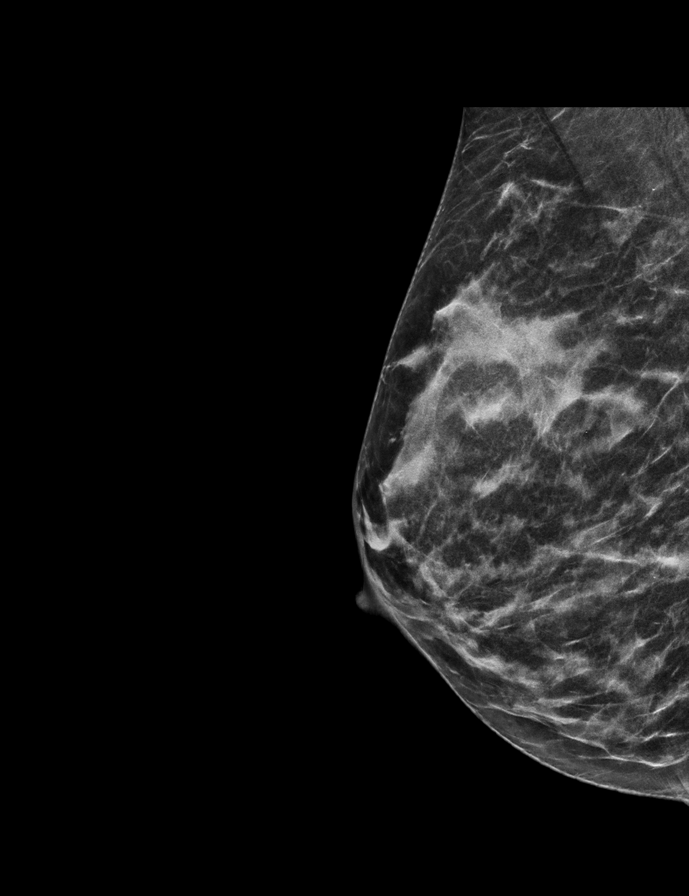

[R MLO tomo · 2 of 49 frames shown]
[frame 16/49]
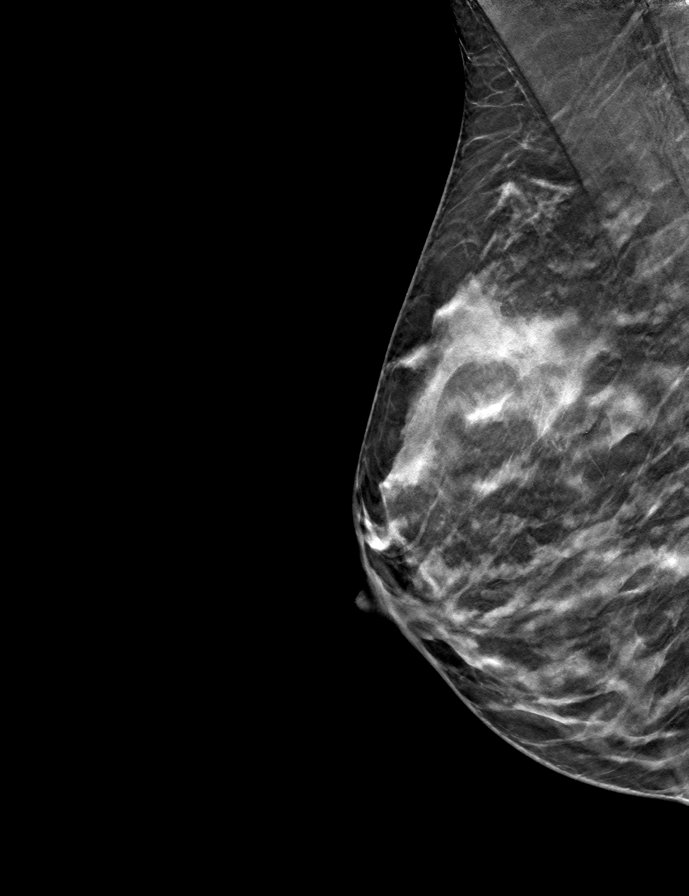
[frame 25/49]
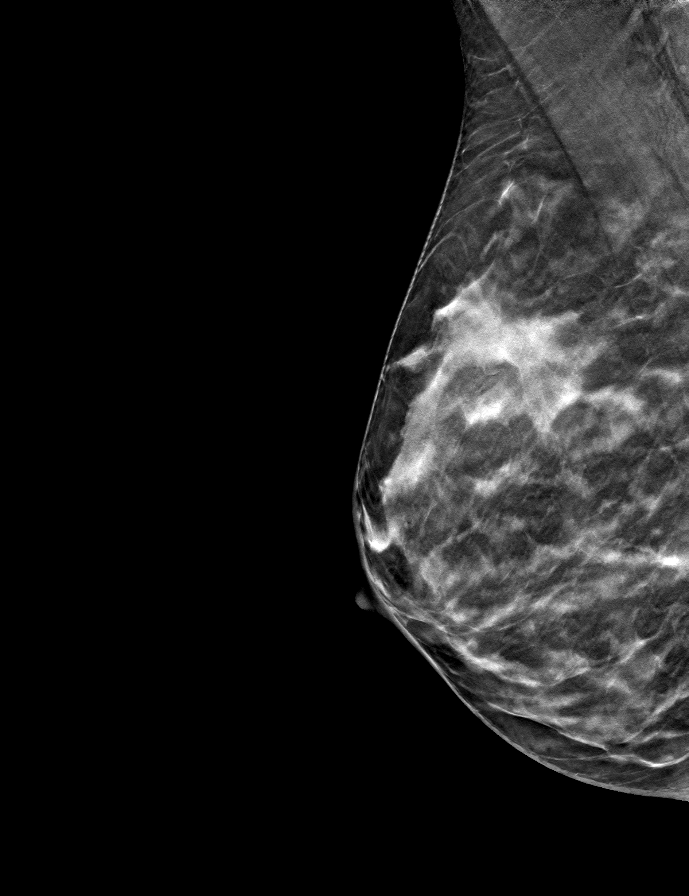

[R CC tomo · tomo slice 25/50.0]
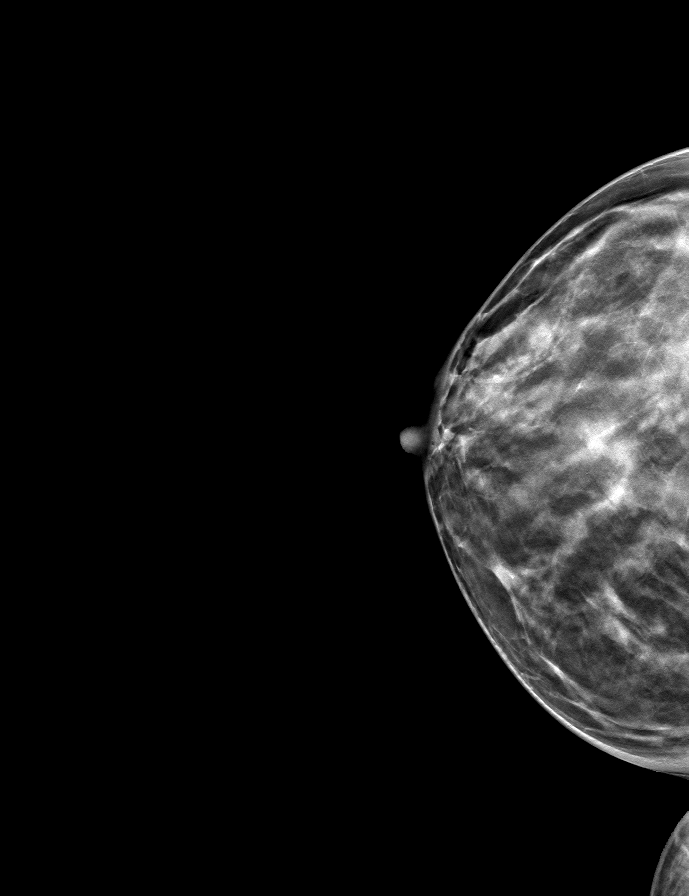

[L CC tomo · tomo slice 24/47.0]
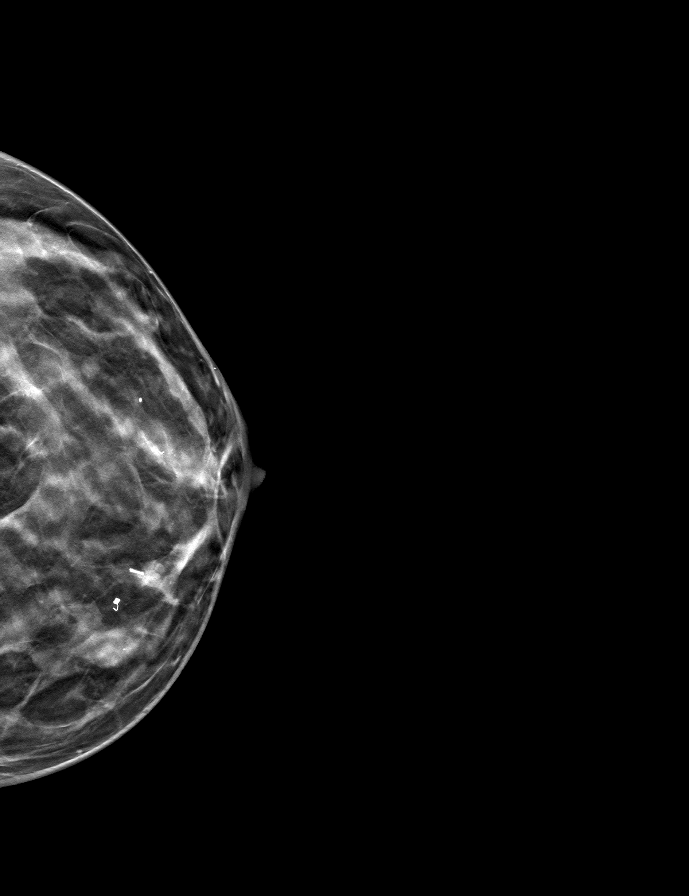

[L MLO tomo · tomo slice 25/48.0]
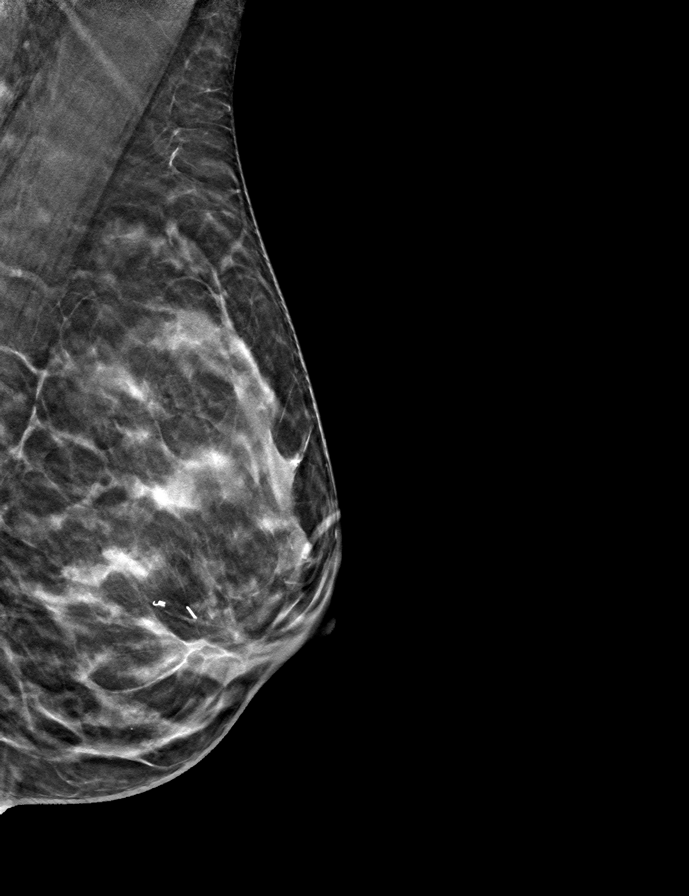

[9 of 24 positions shown; findings below may reference images not displayed]

ACR Breast Density Category c: The breast tissue is heterogeneously
dense, which may obscure small masses.
FINDINGS: There are no findings suspicious for malignancy. Images were
processed with CAD.
IMPRESSION: No mammographic evidence of malignancy. A result letter of this
screening mammogram will be mailed directly to the patient.

RECOMMENDATION:
Screening mammogram in one year. (Code:[5V])

BI-RADS CATEGORY  1: Negative.

## 2018-12-16 HISTORY — PX: COLONOSCOPY: SHX174

## 2019-02-28 ENCOUNTER — Encounter (HOSPITAL_COMMUNITY): Payer: Self-pay | Admitting: *Deleted

## 2019-02-28 ENCOUNTER — Other Ambulatory Visit: Payer: Self-pay

## 2019-02-28 ENCOUNTER — Ambulatory Visit (HOSPITAL_COMMUNITY)
Admission: EM | Admit: 2019-02-28 | Discharge: 2019-02-28 | Disposition: A | Discharge status: Home / Self Care | Payer: BC Managed Care – PPO

## 2019-02-28 DIAGNOSIS — M546 Pain in thoracic spine: Secondary | ICD-10-CM | POA: Diagnosis not present

## 2019-02-28 DIAGNOSIS — M62838 Other muscle spasm: Secondary | ICD-10-CM

## 2019-02-28 MED ORDER — IBUPROFEN 800 MG PO TABS: 800.0000 mg | ORAL_TABLET | Freq: Three times a day (TID) | ORAL | 0 refills | Status: DC | PRN

## 2019-02-28 MED ORDER — CYCLOBENZAPRINE HCL 10 MG PO TABS: 10.0000 mg | ORAL_TABLET | Freq: Three times a day (TID) | ORAL | 0 refills | Status: DC | PRN

## 2019-02-28 NOTE — Discharge Instructions (Addendum)
Agree that you appear to have a back spasm. Treat with Ibuprofen 800mg  every 8 hours for 48 hours. Heat and topical massage would be helpful. Take flexeril as needed. May start with a 1/2 tablet. Feel better. If worsens f/u with PCP.

## 2019-02-28 NOTE — ED Triage Notes (Signed)
Reports starting with left side pain, which has now moved to mid back and through to epigastric region.  States woke with this constant pain 2 days ago.  Pain worse with movement.  States had same sx couple yrs ago - was treated successfully with muscle relaxers.
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
## 2019-05-21 ENCOUNTER — Other Ambulatory Visit: Payer: Self-pay | Admitting: Obstetrics and Gynecology

## 2019-05-21 DIAGNOSIS — N6001 Solitary cyst of right breast: Secondary | ICD-10-CM

## 2019-05-21 DIAGNOSIS — N6002 Solitary cyst of left breast: Secondary | ICD-10-CM

## 2019-06-03 ENCOUNTER — Ambulatory Visit
Admission: RE | Admit: 2019-06-03 | Discharge: 2019-06-03 | Disposition: A | Discharge status: Home / Self Care | Payer: BC Managed Care – PPO | Source: Ambulatory Visit | Attending: Obstetrics and Gynecology | Admitting: Obstetrics and Gynecology

## 2019-06-03 ENCOUNTER — Ambulatory Visit: Payer: BC Managed Care – PPO

## 2019-06-03 ENCOUNTER — Other Ambulatory Visit: Payer: Self-pay

## 2019-06-03 DIAGNOSIS — N6001 Solitary cyst of right breast: Secondary | ICD-10-CM

## 2019-06-03 DIAGNOSIS — N6002 Solitary cyst of left breast: Secondary | ICD-10-CM

## 2019-06-03 IMAGING — US US BREAST*L* LIMITED INC AXILLA
1 series · 3 of 3 positions shown · non-contrast
Comparison: Previous exam(s).
COMPARISON: Previous exam(s).

Addendum:
CLINICAL DATA: Palpable lump on the left.

EXAM:
DIGITAL DIAGNOSTIC BILATERAL MAMMOGRAM WITH CAD AND TOMO
ULTRASOUND LEFT BREAST

[Series 1: us breast*left* limited inc axilla · 0.07mm/px · 3 of 3 slices shown]
[im 1/3]
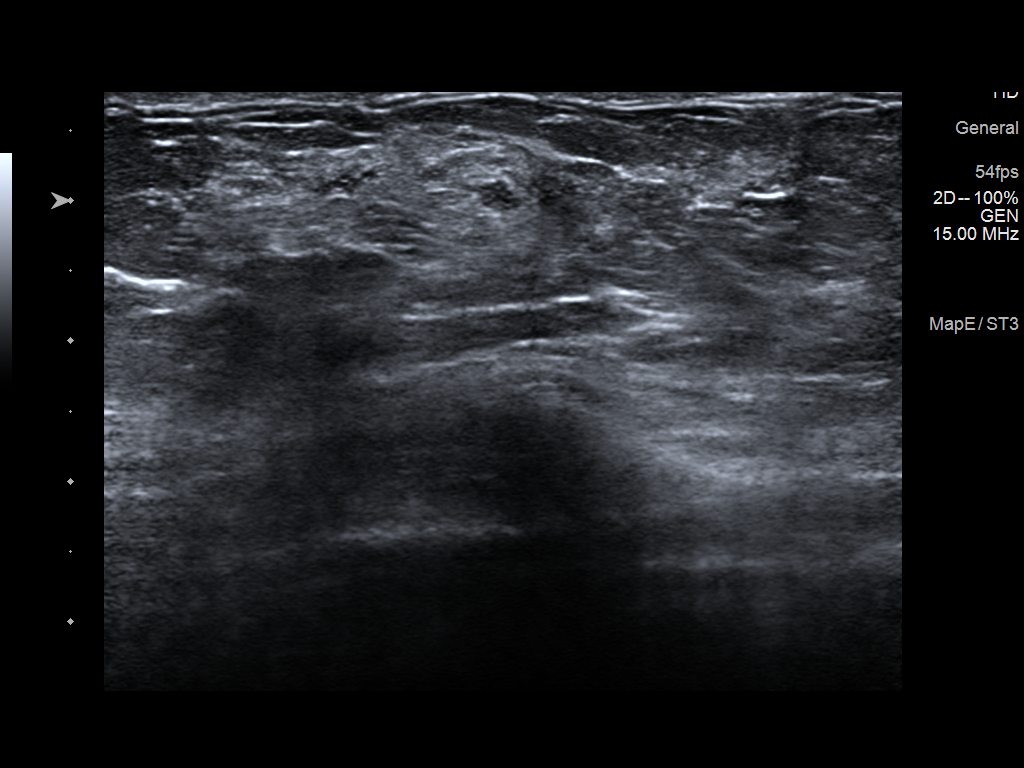
[im 2/3]
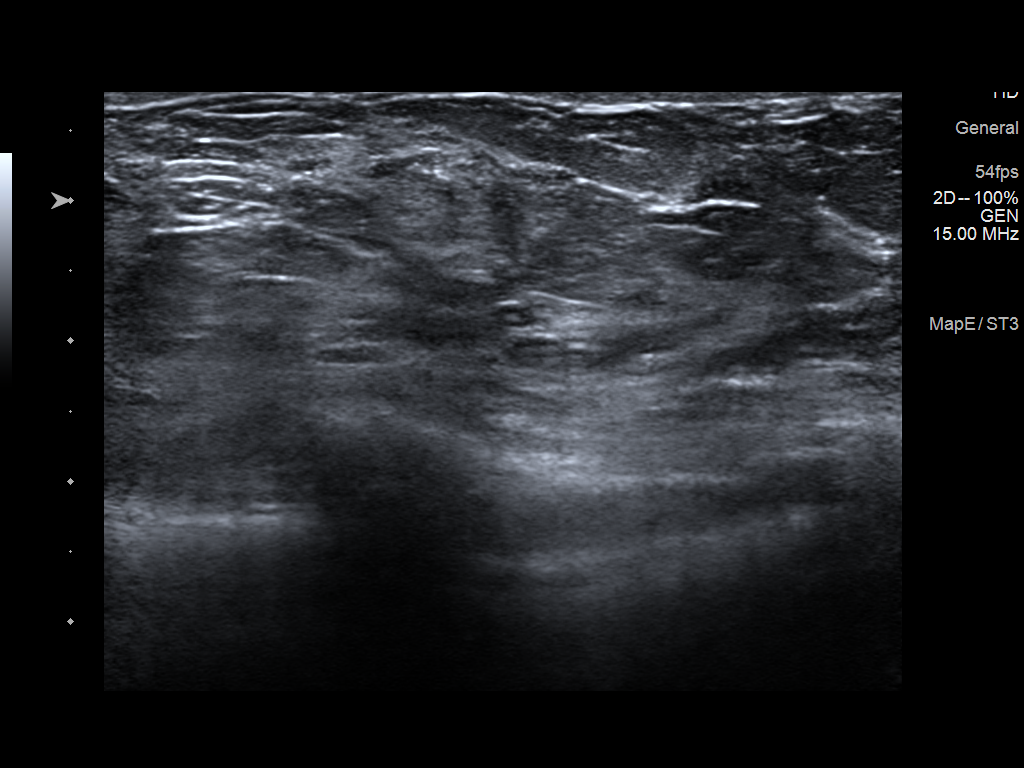
[im 3/3]
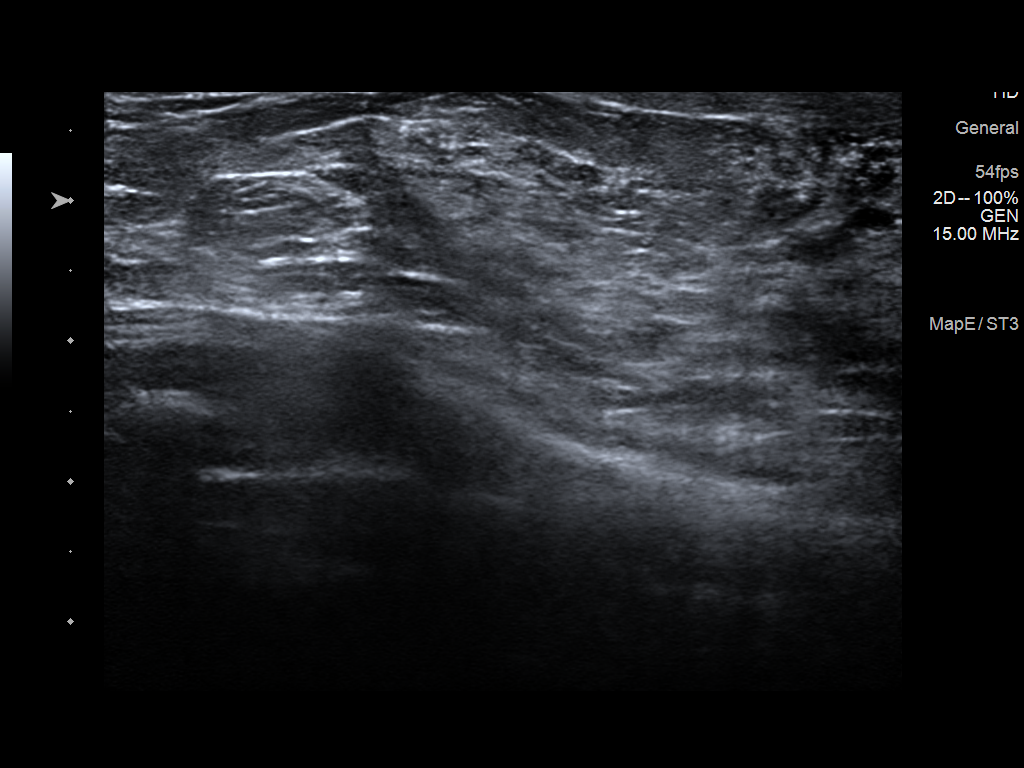

[3 of 3 positions shown; findings below may reference images not displayed]

ACR Breast Density Category c: The breast tissue is heterogeneously
dense, which may obscure small masses.
FINDINGS: No suspicious masses, calcifications, or distortion are identified
in either breast. Postsurgical changes are seen on the left.

Mammographic images were processed with CAD.

On physical exam, no suspicious lumps are identified.

Targeted ultrasound is performed, showing dense glandular tissue in
the region of the patient's lump.
IMPRESSION: No mammographic or sonographic evidence of malignancy.

RECOMMENDATION:
Treatment of the patient's palpable lump should be based on clinical
and physical exam given lack of imaging findings.

I have discussed the findings and recommendations with the patient.
If applicable, a reminder letter will be sent to the patient
regarding the next appointment.

BI-RADS CATEGORY  2: Benign.

ADDENDUM:
Recommend annual screening mammography in [DATE]

*** End of Addendum ***
ACR Breast Density Category c: The breast tissue is heterogeneously
dense, which may obscure small masses.
FINDINGS: No suspicious masses, calcifications, or distortion are identified
in either breast. Postsurgical changes are seen on the left.

Mammographic images were processed with CAD.

On physical exam, no suspicious lumps are identified.

Targeted ultrasound is performed, showing dense glandular tissue in
the region of the patient's lump.
IMPRESSION: No mammographic or sonographic evidence of malignancy.

RECOMMENDATION:
Treatment of the patient's palpable lump should be based on clinical
and physical exam given lack of imaging findings.

I have discussed the findings and recommendations with the patient.
If applicable, a reminder letter will be sent to the patient
regarding the next appointment.

BI-RADS CATEGORY  2: Benign.

## 2019-06-03 IMAGING — MG DIGITAL DIAGNOSTIC BILAT W/ TOMO W/ CAD
6 of 10 series · 6 of 30 positions shown · non-contrast
Comparison: Previous exam(s).
COMPARISON: Previous exam(s).

Addendum:
CLINICAL DATA: Palpable lump on the left.

EXAM:
DIGITAL DIAGNOSTIC BILATERAL MAMMOGRAM WITH CAD AND TOMO
ULTRASOUND LEFT BREAST

[R CC synth-2D]
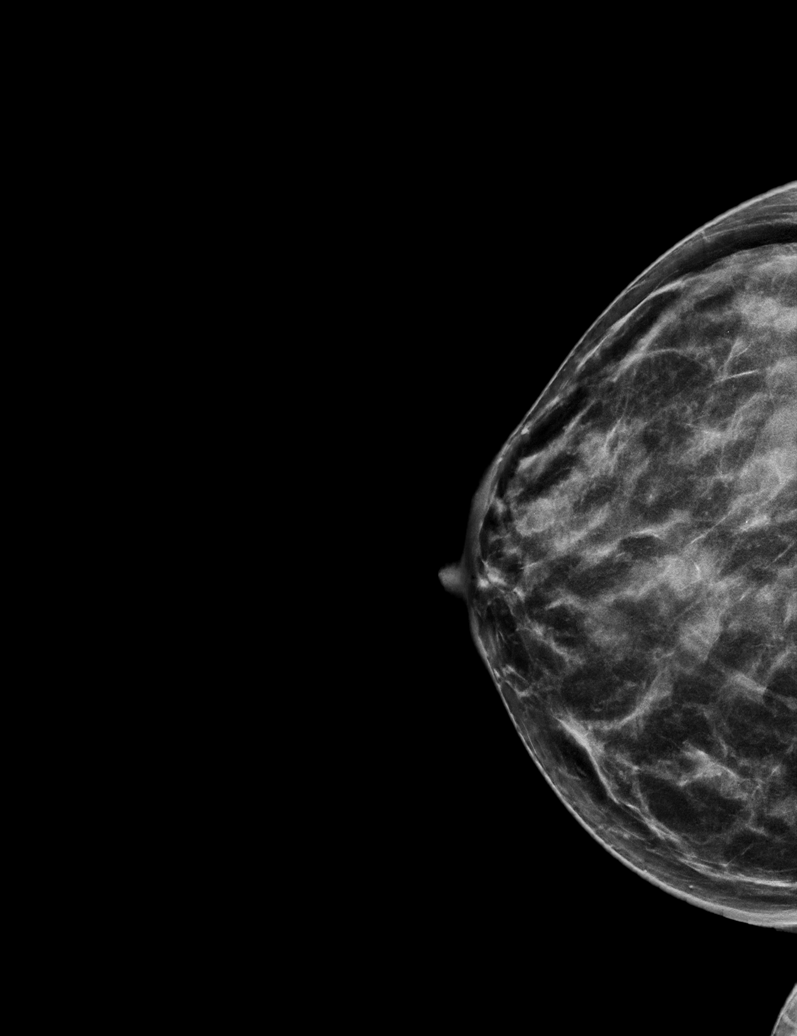

[R MLO synth-2D]
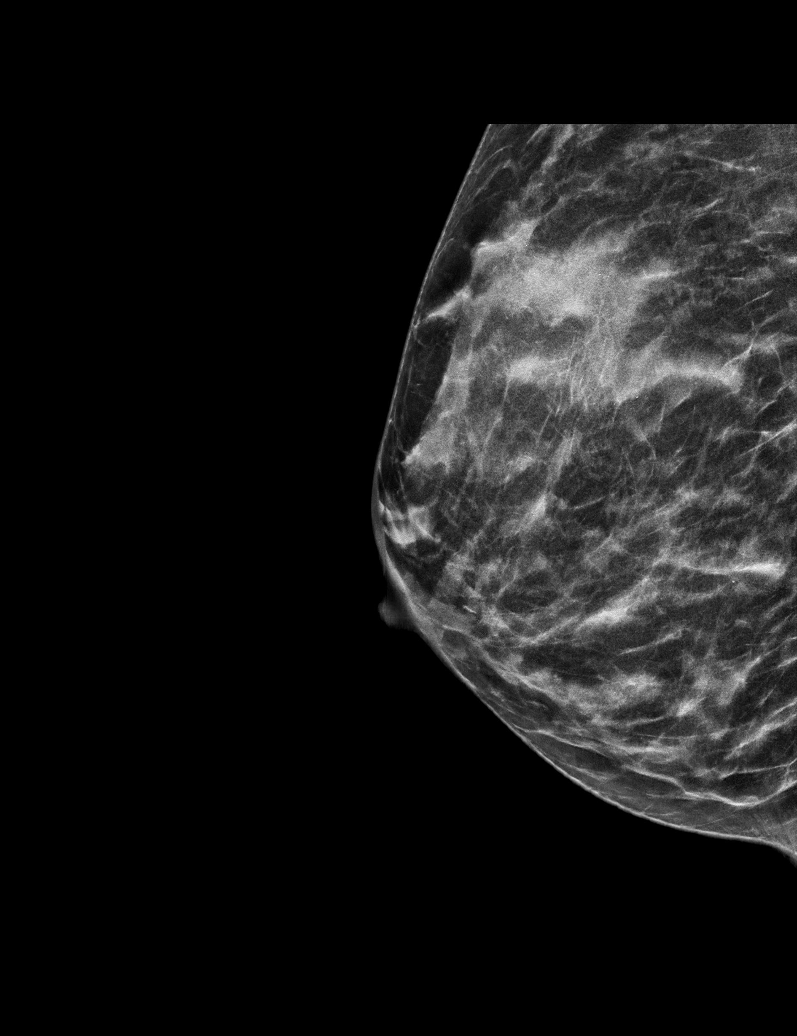

[L CC synth-2D]
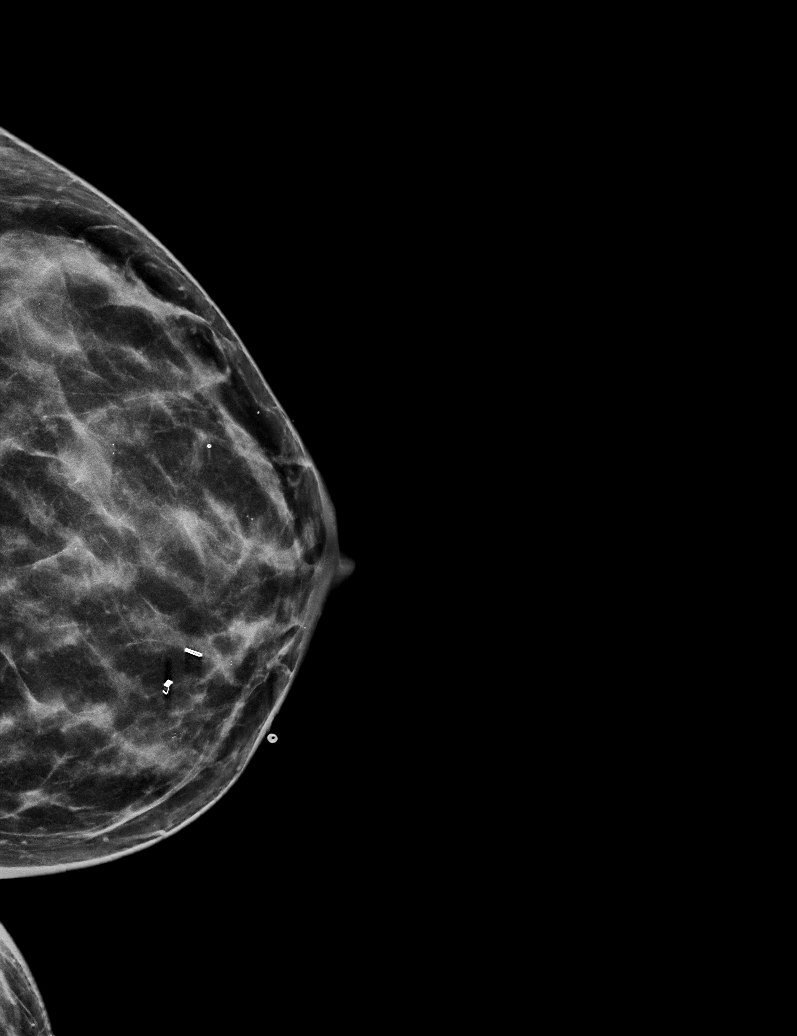

[L MLO synth-2D]
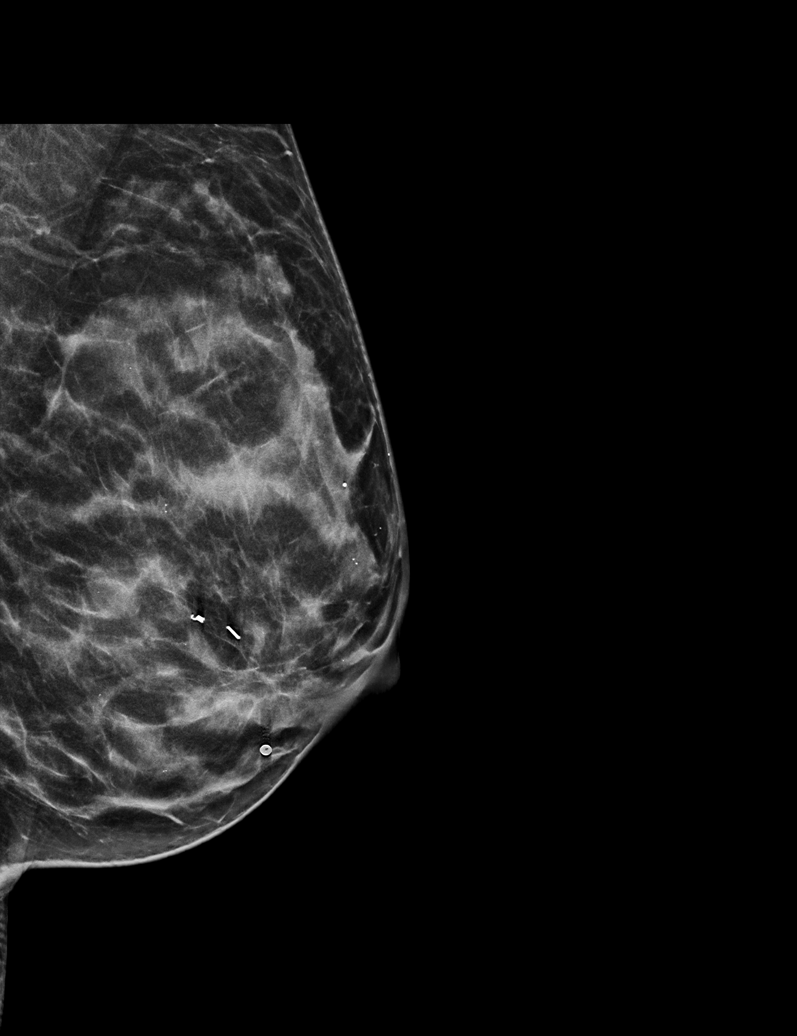

[L TAN synth-2D]
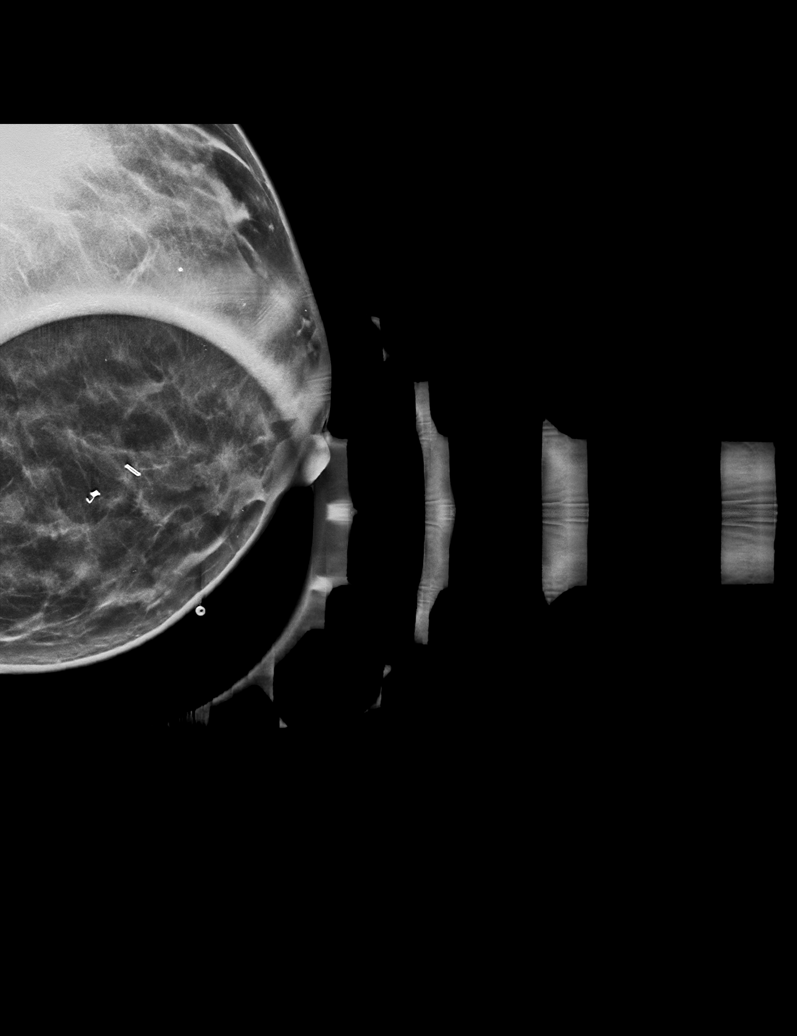

[R MLO tomo · tomo slice 27/52.0]
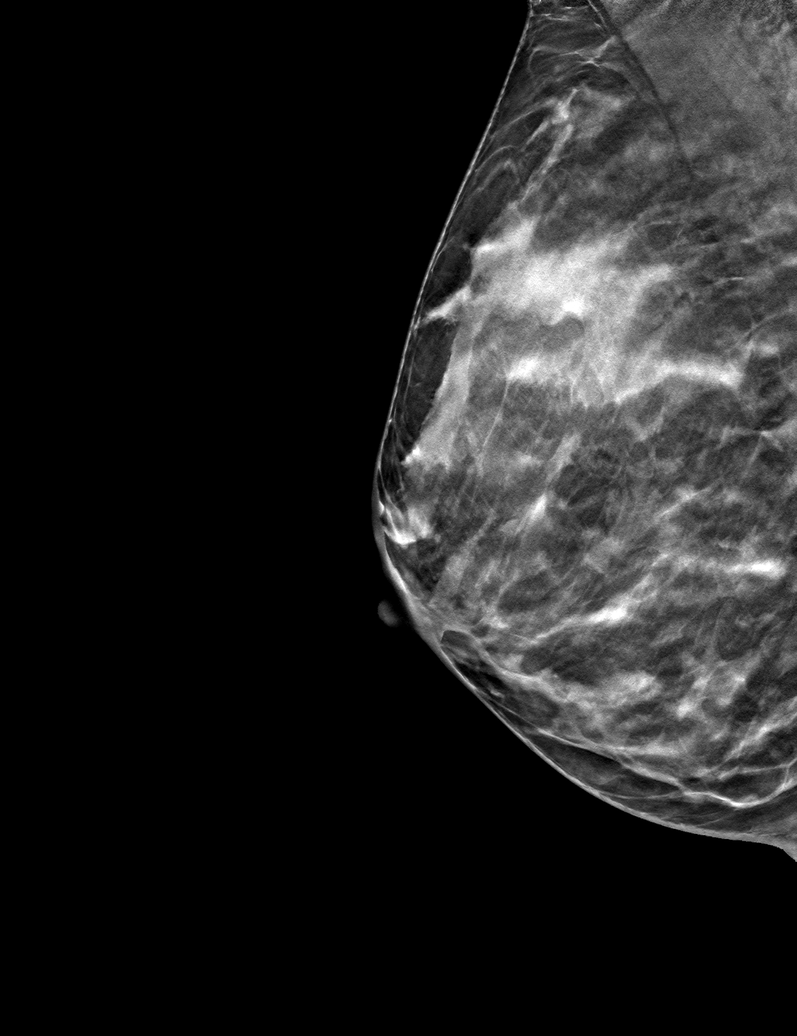

[6 of 30 positions shown; findings below may reference images not displayed]

ACR Breast Density Category c: The breast tissue is heterogeneously
dense, which may obscure small masses.
FINDINGS: No suspicious masses, calcifications, or distortion are identified
in either breast. Postsurgical changes are seen on the left.

Mammographic images were processed with CAD.

On physical exam, no suspicious lumps are identified.

Targeted ultrasound is performed, showing dense glandular tissue in
the region of the patient's lump.
IMPRESSION: No mammographic or sonographic evidence of malignancy.

RECOMMENDATION:
Treatment of the patient's palpable lump should be based on clinical
and physical exam given lack of imaging findings.

I have discussed the findings and recommendations with the patient.
If applicable, a reminder letter will be sent to the patient
regarding the next appointment.

BI-RADS CATEGORY  2: Benign.

ADDENDUM:
Recommend annual screening mammography in [DATE]

*** End of Addendum ***
ACR Breast Density Category c: The breast tissue is heterogeneously
dense, which may obscure small masses.
FINDINGS: No suspicious masses, calcifications, or distortion are identified
in either breast. Postsurgical changes are seen on the left.

Mammographic images were processed with CAD.

On physical exam, no suspicious lumps are identified.

Targeted ultrasound is performed, showing dense glandular tissue in
the region of the patient's lump.
IMPRESSION: No mammographic or sonographic evidence of malignancy.

RECOMMENDATION:
Treatment of the patient's palpable lump should be based on clinical
and physical exam given lack of imaging findings.

I have discussed the findings and recommendations with the patient.
If applicable, a reminder letter will be sent to the patient
regarding the next appointment.

BI-RADS CATEGORY  2: Benign.

## 2019-08-18 ENCOUNTER — Other Ambulatory Visit: Payer: Self-pay | Admitting: Internal Medicine

## 2019-08-18 DIAGNOSIS — R519 Headache, unspecified: Secondary | ICD-10-CM

## 2019-09-01 ENCOUNTER — Ambulatory Visit
Admission: RE | Admit: 2019-09-01 | Discharge: 2019-09-01 | Disposition: A | Discharge status: Home / Self Care | Payer: BC Managed Care – PPO | Source: Ambulatory Visit | Attending: Internal Medicine | Admitting: Internal Medicine

## 2019-09-01 ENCOUNTER — Other Ambulatory Visit: Payer: Self-pay

## 2019-09-01 DIAGNOSIS — R519 Headache, unspecified: Secondary | ICD-10-CM

## 2019-09-01 IMAGING — CT CT HEAD W/O CM
1 series · 16 of 30 positions shown, 20 images · non-contrast
Comparison: Head CT [DATE]

CLINICAL DATA: Severe frontal headaches. Additional history
provided: Chronic headaches, mainly right frontal area.

EXAM:
CT HEAD WITHOUT CONTRAST
TECHNIQUE: Contiguous axial images were obtained from the base of the skull
through the vertex without intravenous contrast.

[Series 2: head w/(date) · axial · 0.42mm/px · z∈[-176,-30]mm · 16 of 33 slices shown, 20 images]
[im 2/33  brain]
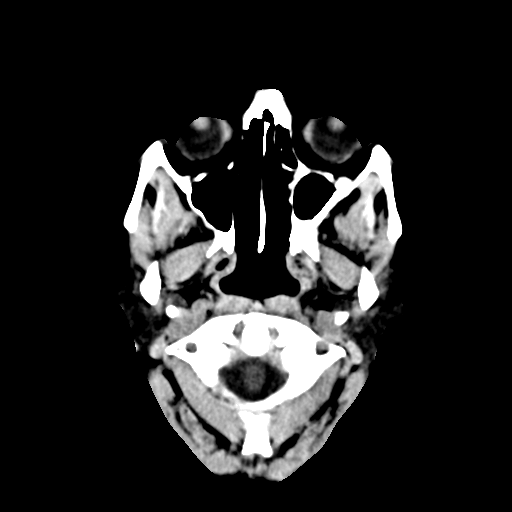
[im 2/33  bone]
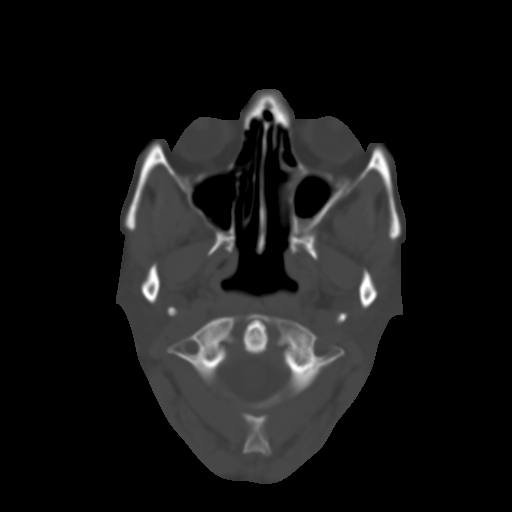
[im 4/33  brain]
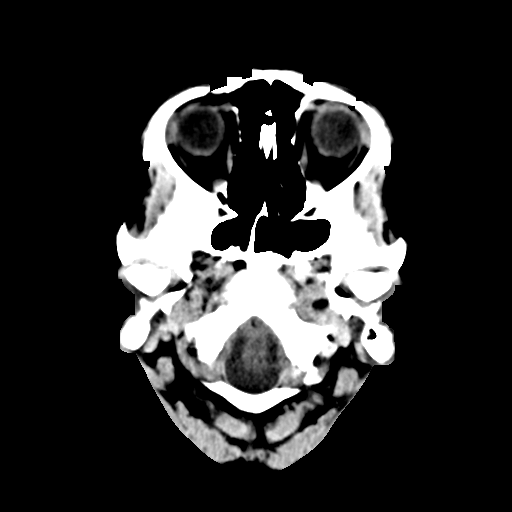
[im 6/33  brain]
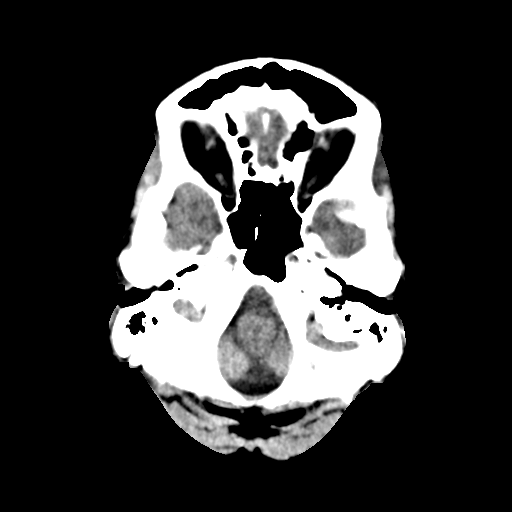
[im 8/33  brain]
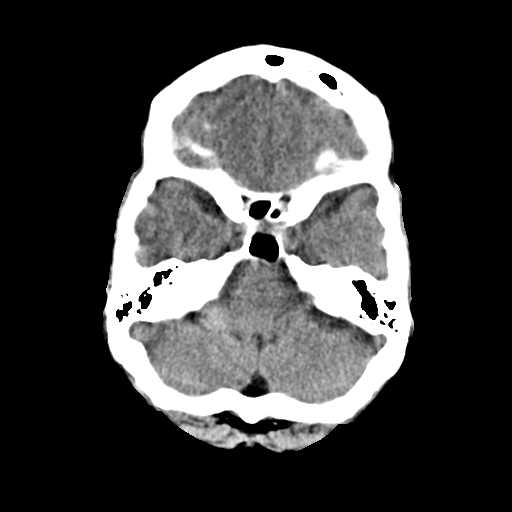
[im 9/33  brain]
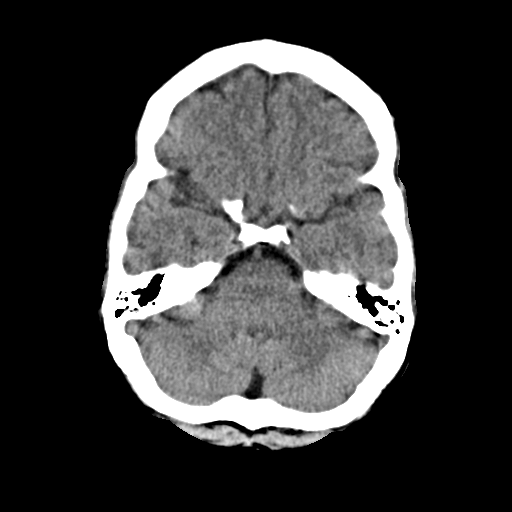
[im 9/33  bone]
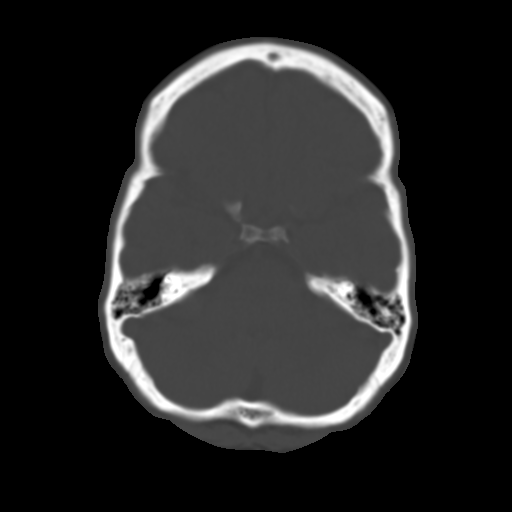
[im 12/33  brain]
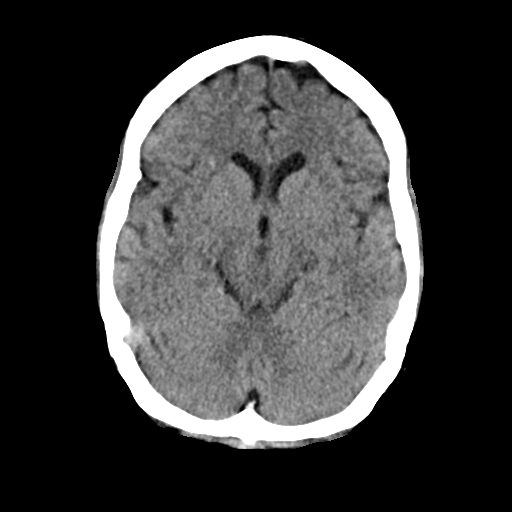
[im 14/33  brain]
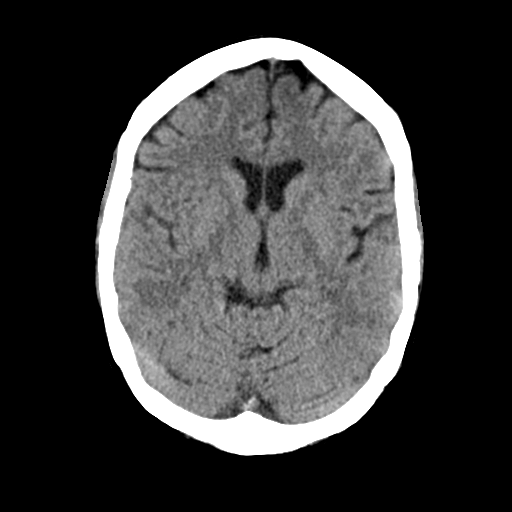
[im 16/33  brain]
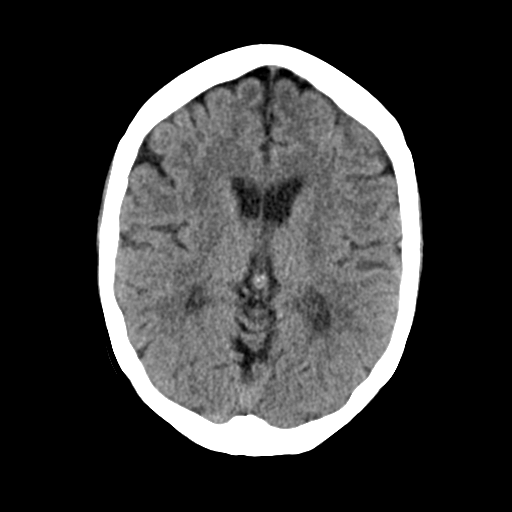
[im 17/33  brain]
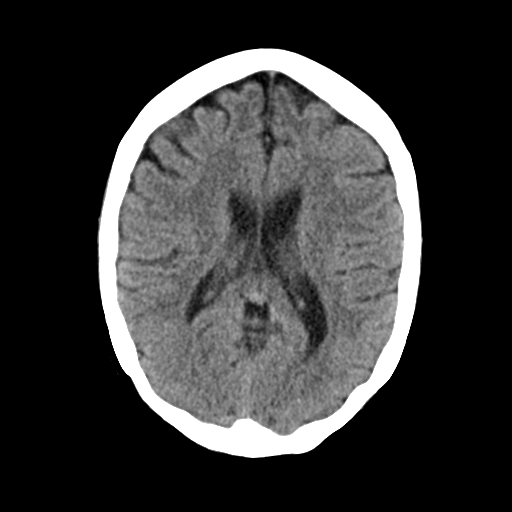
[im 17/33  bone]
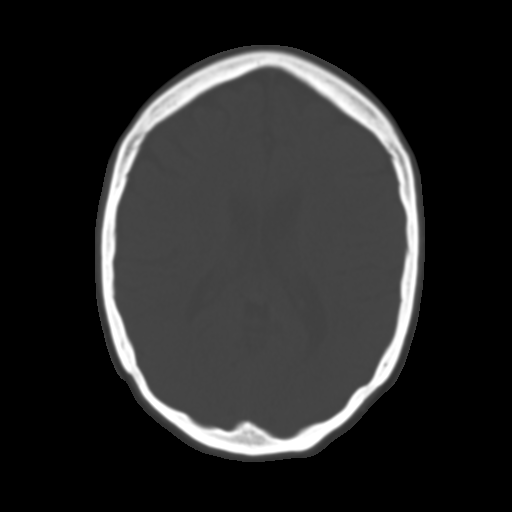
[im 19/33  brain]
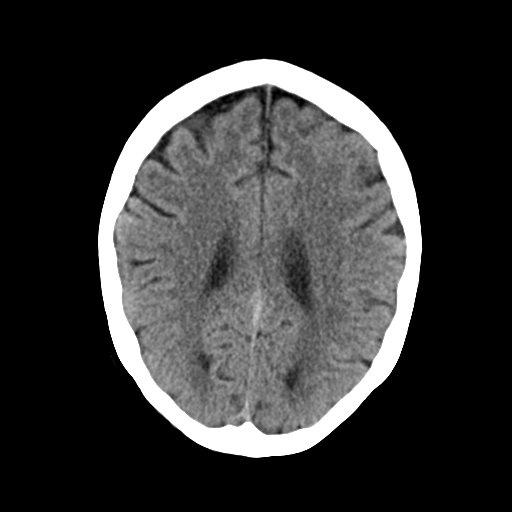
[im 21/33  brain]
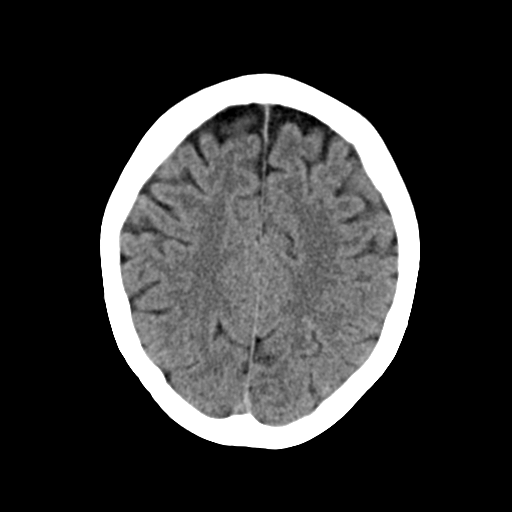
[im 24/33  brain]
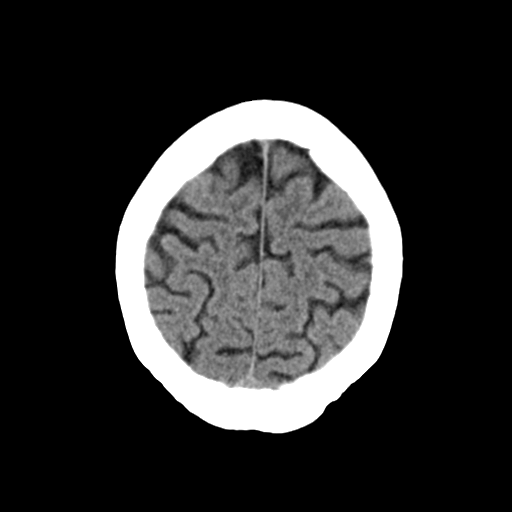
[im 25/33  brain]
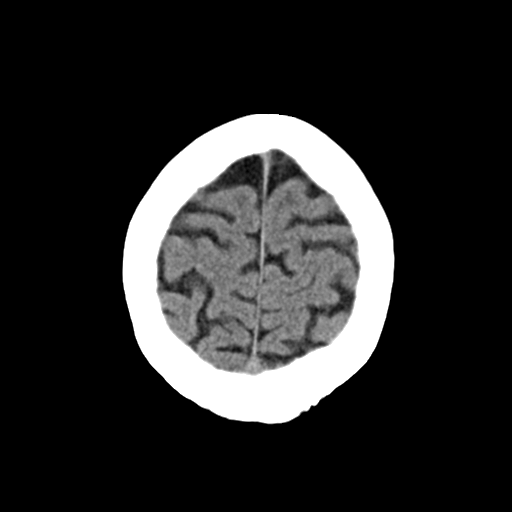
[im 25/33  bone]
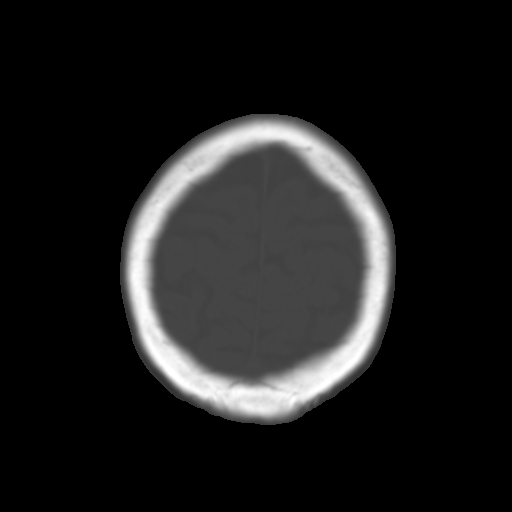
[im 27/33  brain]
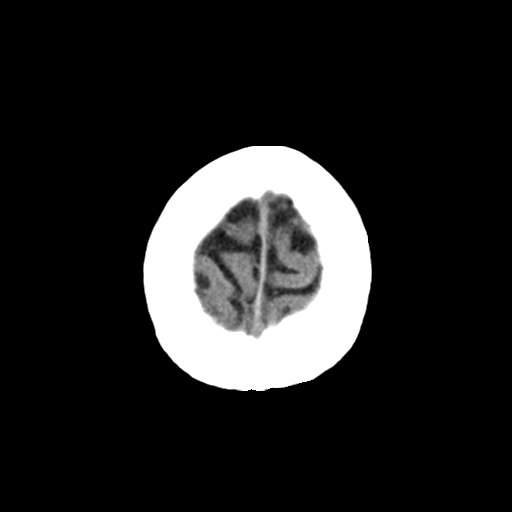
[im 29/33  brain]
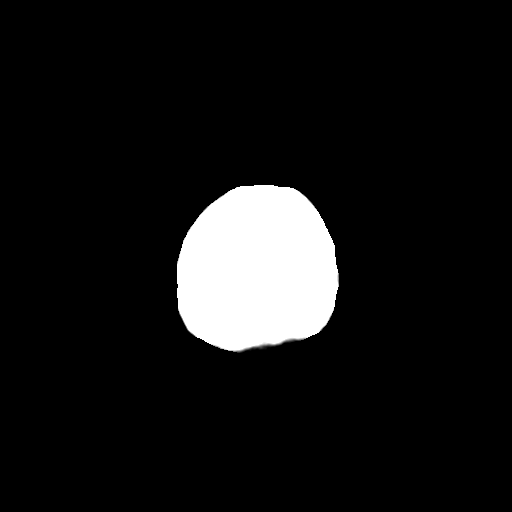
[im 31/33  brain]
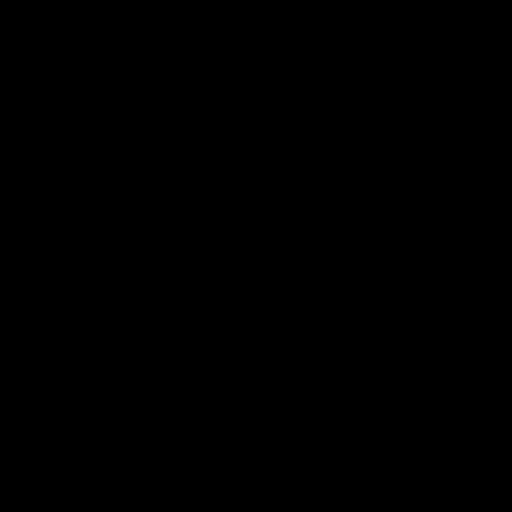

[16 of 30 positions shown; findings below may reference images not displayed]

FINDINGS: Brain:

Cerebral volume is normal for age.

There is no acute intracranial hemorrhage.

No demarcated cortical infarct.

No extra-axial fluid collection.

No evidence of intracranial mass.

No midline shift.

Vascular: No hyperdense vessel.

Skull: Normal. Negative for fracture or focal lesion.

Sinuses/Orbits: Visualized orbits show no acute finding. Left
maxillary sinus air-fluid level. No significant mastoid effusion.
IMPRESSION: Unremarkable non-contrast CT appearance of the brain. No evidence of
acute intracranial abnormality.

Left maxillary sinus air-fluid level. Correlate for acute sinusitis.

## 2019-10-12 ENCOUNTER — Ambulatory Visit (INDEPENDENT_AMBULATORY_CARE_PROVIDER_SITE_OTHER): Payer: BC Managed Care – PPO | Admitting: Neurology

## 2019-10-12 ENCOUNTER — Encounter: Payer: Self-pay | Admitting: Neurology

## 2019-10-12 VITALS — BP 101/68 | HR 70 | Ht 67.0 in | Wt 124.0 lb

## 2019-10-12 DIAGNOSIS — H539 Unspecified visual disturbance: Secondary | ICD-10-CM

## 2019-10-12 DIAGNOSIS — R51 Headache with orthostatic component, not elsewhere classified: Secondary | ICD-10-CM

## 2019-10-12 DIAGNOSIS — R519 Headache, unspecified: Secondary | ICD-10-CM

## 2019-10-12 DIAGNOSIS — G08 Intracranial and intraspinal phlebitis and thrombophlebitis: Secondary | ICD-10-CM | POA: Diagnosis not present

## 2019-10-12 DIAGNOSIS — R292 Abnormal reflex: Secondary | ICD-10-CM

## 2019-10-12 DIAGNOSIS — R29898 Other symptoms and signs involving the musculoskeletal system: Secondary | ICD-10-CM

## 2019-10-12 MED ORDER — AJOVY 225 MG/1.5ML ~~LOC~~ SOAJ: 225.0000 mg | SUBCUTANEOUS | 0 refills | Status: DC

## 2019-10-12 NOTE — Patient Instructions (Signed)
Blood work MRI brain and veins of the head Start you on a temporary migraine medication Continue working with obgyn to address the bleeding Recommend eye examination  Fremanezumab injection What is this medicine? FREMANEZUMAB (fre ma NEZ ue mab) is used to prevent migraine headaches. This medicine may be used for other purposes; ask your health care provider or pharmacist if you have questions. COMMON BRAND NAME(S): AJOVY What should I tell my health care provider before I take this medicine? They need to know if you have any of these conditions:  an unusual or allergic reaction to fremanezumab, other medicines, foods, dyes, or preservatives  pregnant or trying to get pregnant  breast-feeding How should I use this medicine? This medicine is for injection under the skin. You will be taught how to prepare and give this medicine. Use exactly as directed. Take your medicine at regular intervals. Do not take your medicine more often than directed. It is important that you put your used needles and syringes in a special sharps container. Do not put them in a trash can. If you do not have a sharps container, call your pharmacist or healthcare provider to get one. Talk to your pediatrician regarding the use of this medicine in children. Special care may be needed. Overdosage: If you think you have taken too much of this medicine contact a poison control center or emergency room at once. NOTE: This medicine is only for you. Do not share this medicine with others. What if I miss a dose? If you miss a dose, take it as soon as you can. If it is almost time for your next dose, take only that dose. Do not take double or extra doses. What may interact with this medicine? Interactions are not expected. This list may not describe all possible interactions. Give your health care provider a list of all the medicines, herbs, non-prescription drugs, or dietary supplements you use. Also tell them if you  smoke, drink alcohol, or use illegal drugs. Some items may interact with your medicine. What should I watch for while using this medicine? Tell your doctor or healthcare professional if your symptoms do not start to get better or if they get worse. What side effects may I notice from receiving this medicine? Side effects that you should report to your doctor or health care professional as soon as possible:  allergic reactions like skin rash, itching or hives, swelling of the face, lips, or tongue Side effects that usually do not require medical attention (report these to your doctor or health care professional if they continue or are bothersome):  pain, redness, or irritation at site where injected This list may not describe all possible side effects. Call your doctor for medical advice about side effects. You may report side effects to FDA at 1-800-FDA-1088. Where should I keep my medicine? Keep out of the reach of children. You will be instructed on how to store this medicine. Throw away any unused medicine after the expiration date on the label. NOTE: This sheet is a summary. It may not cover all possible information. If you have questions about this medicine, talk to your doctor, pharmacist, or health care provider.  2020 Elsevier/Gold Standard (2016-12-31 17:22:56)
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
## 2019-10-13 LAB — COMPREHENSIVE METABOLIC PANEL
ALT: 14 IU/L (ref 0–32)
AST: 19 IU/L (ref 0–40)
Albumin/Globulin Ratio: 2.1 (ref 1.2–2.2)
Albumin: 4.6 g/dL (ref 3.8–4.8)
Alkaline Phosphatase: 53 IU/L (ref 48–121)
BUN/Creatinine Ratio: 13 (ref 9–23)
BUN: 10 mg/dL (ref 6–24)
Bilirubin Total: 0.3 mg/dL (ref 0.0–1.2)
CO2: 25 mmol/L (ref 20–29)
Calcium: 9.4 mg/dL (ref 8.7–10.2)
Chloride: 103 mmol/L (ref 96–106)
Creatinine, Ser: 0.77 mg/dL (ref 0.57–1.00)
GFR calc Af Amer: 104 mL/min/{1.73_m2} (ref >59)
GFR calc non Af Amer: 90 mL/min/{1.73_m2} (ref >59)
Globulin, Total: 2.2 g/dL (ref 1.5–4.5)
Glucose: 77 mg/dL (ref 65–99)
Potassium: 4 mmol/L (ref 3.5–5.2)
Sodium: 143 mmol/L (ref 134–144)
Total Protein: 6.8 g/dL (ref 6.0–8.5)

## 2019-10-13 LAB — CBC WITH DIFFERENTIAL/PLATELET
Basophils Absolute: 0.1 10*3/uL (ref 0.0–0.2)
Basos: 1 %
EOS (ABSOLUTE): 0.2 10*3/uL (ref 0.0–0.4)
Eos: 4 %
Hematocrit: 42.1 % (ref 34.0–46.6)
Hemoglobin: 14.6 g/dL (ref 11.1–15.9)
Immature Grans (Abs): 0 10*3/uL (ref 0.0–0.1)
Immature Granulocytes: 0 %
Lymphocytes Absolute: 1.9 10*3/uL (ref 0.7–3.1)
Lymphs: 36 %
MCH: 32.9 pg (ref 26.6–33.0)
MCHC: 34.7 g/dL (ref 31.5–35.7)
MCV: 95 fL (ref 79–97)
Monocytes Absolute: 0.6 10*3/uL (ref 0.1–0.9)
Monocytes: 11 %
Neutrophils Absolute: 2.5 10*3/uL (ref 1.4–7.0)
Neutrophils: 48 %
Platelets: 225 10*3/uL (ref 150–450)
RBC: 4.44 x10E6/uL (ref 3.77–5.28)
RDW: 12.6 % (ref 11.7–15.4)
WBC: 5.2 10*3/uL (ref 3.4–10.8)

## 2019-10-13 LAB — HEMOGLOBIN A1C
Est. average glucose Bld gHb Est-mCnc: 97 mg/dL
Hgb A1c MFr Bld: 5 % (ref 4.8–5.6)

## 2019-10-13 LAB — C-REACTIVE PROTEIN: CRP: <1 mg/L (ref 0–10)

## 2019-10-13 LAB — SEDIMENTATION RATE: Sed Rate: 2 mm/hr (ref 0–40)

## 2019-10-13 LAB — TSH: TSH: 2.31 u[IU]/mL (ref 0.450–4.500)

## 2019-10-15 ENCOUNTER — Telehealth: Payer: Self-pay

## 2019-10-15 NOTE — Telephone Encounter (Signed)
no to the covid questions MR Brain w/wo contrast & MRA Head wo contrast Dr. Valaria Good Auth: [id number] (exp. 10/15/19 to 04/11/20) I spoke with the patient she is scheduled at [facility] for 10/27/19.

## 2019-10-15 NOTE — Telephone Encounter (Signed)
Pt called to discuss insurance authorization for her MRIs and would like a call back.

## 2019-10-27 ENCOUNTER — Ambulatory Visit: Payer: BC Managed Care – PPO

## 2019-10-27 DIAGNOSIS — R51 Headache with orthostatic component, not elsewhere classified: Secondary | ICD-10-CM

## 2019-10-27 DIAGNOSIS — H539 Unspecified visual disturbance: Secondary | ICD-10-CM

## 2019-10-27 DIAGNOSIS — G08 Intracranial and intraspinal phlebitis and thrombophlebitis: Secondary | ICD-10-CM

## 2019-10-27 DIAGNOSIS — R29898 Other symptoms and signs involving the musculoskeletal system: Secondary | ICD-10-CM

## 2019-10-27 DIAGNOSIS — R519 Headache, unspecified: Secondary | ICD-10-CM

## 2019-10-27 DIAGNOSIS — R292 Abnormal reflex: Secondary | ICD-10-CM

## 2019-10-27 MED ORDER — GADOBENATE DIMEGLUMINE 529 MG/ML IV SOLN
12.0000 mL | Freq: Once | INTRAVENOUS | Status: AC | PRN
  Administered 2019-10-27: 12 mL via INTRAVENOUS

## 2019-12-25 ENCOUNTER — Telehealth: Payer: Self-pay | Admitting: Neurology

## 2019-12-25 NOTE — Telephone Encounter (Signed)
..   Pt understands that although there may be some limitations with this type of visit, we will take all precautions to reduce any security or privacy concerns.  Pt understands that this will be treated like an in office visit and we will file with pt's insurance, and there may be a patient responsible charge related to this service. ? ?

## 2020-01-18 ENCOUNTER — Ambulatory Visit: Payer: BC Managed Care – PPO | Admitting: Adult Health

## 2020-03-30 ENCOUNTER — Telehealth: Payer: BC Managed Care – PPO | Admitting: Family Medicine

## 2020-04-16 DIAGNOSIS — C4491 Basal cell carcinoma of skin, unspecified: Secondary | ICD-10-CM

## 2020-04-16 HISTORY — DX: Basal cell carcinoma of skin, unspecified: C44.91

## 2020-05-10 ENCOUNTER — Other Ambulatory Visit: Payer: Self-pay

## 2020-05-10 ENCOUNTER — Ambulatory Visit (INDEPENDENT_AMBULATORY_CARE_PROVIDER_SITE_OTHER): Payer: 59 | Admitting: Otolaryngology

## 2020-05-10 ENCOUNTER — Encounter (INDEPENDENT_AMBULATORY_CARE_PROVIDER_SITE_OTHER): Payer: Self-pay | Admitting: Otolaryngology

## 2020-05-10 VITALS — Temp 97.2°F

## 2020-05-10 DIAGNOSIS — J3489 Other specified disorders of nose and nasal sinuses: Secondary | ICD-10-CM

## 2020-05-10 DIAGNOSIS — R040 Epistaxis: Secondary | ICD-10-CM | POA: Diagnosis not present

## 2020-05-10 NOTE — Progress Notes (Signed)
HPI: Dawn Jackson is a 50 y.o. female who presents for evaluation of frequent nosebleeds.  It may bleed out of both sides but most commonly the right side.  Last nosebleed was about a week and a half ago.  She has problems with crusting in the nose as well as the nosebleeds.  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  She is not using any recreational drugs in her nose presently but does use saline rinses.  Past Medical History:  Diagnosis Date  . Allergy   . Anxiety   . Black stool 2017  . Depression   . DVT (deep venous thrombosis) ([facility]) 2017   LLE, "resolved"  . Nasal septum perforation   . PTSD (post-traumatic stress disorder)   . Substance abuse [facility])    Past Surgical History:  Procedure Laterality Date  . ADENOIDECTOMY  1973  . BREAST BIOPSY Left x2  . BREAST EXCISIONAL BIOPSY Left 2005   "only one was surgical I think"  . WISDOM TOOTH EXTRACTION     Social History   Socioeconomic History  . Marital status: Married    Spouse name: Not on file  . Number of children: 1  . Years of education: Not on file  . Highest education level: Master's degree (e.g., MA, MS, MEng, MEd, MSW, MBA)  Occupational History  . Not on file  Tobacco Use  . Smoking status: Former Smoker    Packs/day: 1.00    Years: 6.00    Pack years: 6.00    Types: Cigarettes    Quit date: 2009    Years since quitting: 13.0  . Smokeless tobacco: Never Used  Vaping Use  . Vaping Use: Never used  Substance and Sexual Activity  . 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 Drug use: Not Currently    Types: Cocaine    Comment: quit 10/2014  . Sexual activity: Yes  Other Topics Concern  . Not on file  Social History Narrative   Lives at home with husband   Right handed   Caffeine: 2 cups/day   Social Determinants of Health   Financial Resource Strain: Not on file  Food Insecurity: Not on file   Transportation Needs: Not on file  Physical Activity: Not on file  Stress: Not on file  Social Connections: Not on file   Family History  Problem Relation Age of Onset  . Cancer Mother        lung, non small cell carcinoma   . Migraines Mother        "like once a year when we were little"  . Heart disease Father   . Cervical cancer Maternal Grandmother   . COPD Maternal Grandfather   . Cancer Paternal Grandmother   . Breast cancer Paternal Grandmother 50   No Known Allergies Prior to Admission medications   Medication Sig Start Date End Date Taking? Authorizing Provider  DULoxetine (CYMBALTA) 60 MG capsule Take 1 capsule (60 mg total) by mouth 2 (two) times daily. Patient taking differently: Take 120 mg by mouth daily.  06/05/18   Malvin Johns, MD  Fremanezumab-vfrm (AJOVY) 225 MG/1.5ML SOAJ Inject 225 mg into the skin every 30 (thirty) days. 10/12/19   Anson Fret, MD  hydrOXYzine (ATARAX/VISTARIL) 50 MG tablet Take 50 mg by mouth at bedtime.    [provider]  ibuprofen (ADVIL) 800 MG tablet Take 1 tablet (  800 mg total) by mouth every 8 (eight) hours as needed. 02/28/19   Riki Sheer, PA-C  TRAZODONE HCL PO Take 100 mg by mouth at bedtime.     [provider]     Positive ROS: Otherwise negative  All other systems have been reviewed and were otherwise negative with the exception of those mentioned in the HPI and as above.  Physical Exam: Constitutional: Alert, well-appearing, no acute distress Ears: External ears without lesions or tenderness. Ear canals are clear bilaterally with intact, clear TMs.  Nasal: External nose without lesions. Septum with a large almost total cartilaginous septal perforation.  The mucous membranes around the perforation revealed minimal scabbing or crusting.  She has some slight crusting of the middle turbinate more so on the left side.  Cannot identify a site or origin of recent epistaxis..  No abnormal lesions are noted  within the nasal cavity.  Both middle meatus regions are clear with no signs of infection. Oral: Lips and gums without lesions. Tongue and palate mucosa without lesions. Posterior oropharynx clear. Neck: No palpable adenopathy or masses Respiratory: Breathing comfortably  Skin: No facial/neck lesions or rash noted.  Procedures  Assessment: History of recurrent epistaxis. Large septal perforation from use of cocaine when she was younger  Plan: Discussed with her concerning use of nasal gel spray to help with moisturizing the nose. If she has a bad nosebleed can follow-up here to have this cauterized as needed.  If this occurs during the weekend reviewed with her concerning using cotton ball and Afrin to help stop the bleeding. She will follow-up as needed  Narda Bonds, MD

## 2020-07-19 ENCOUNTER — Other Ambulatory Visit: Payer: Self-pay | Admitting: Internal Medicine

## 2020-07-19 DIAGNOSIS — Z0000 Encounter for general adult medical examination without abnormal findings: Secondary | ICD-10-CM

## 2020-08-31 ENCOUNTER — Other Ambulatory Visit (HOSPITAL_COMMUNITY): Payer: Self-pay

## 2020-08-31 MED ORDER — PAXLOVID 20 X 150 MG & 10 X 100MG PO TBPK
ORAL_TABLET | ORAL | 0 refills | Status: DC
  Filled 2020-08-31: qty 30, 5d supply, fill #0

## 2020-09-01 ENCOUNTER — Other Ambulatory Visit (HOSPITAL_COMMUNITY): Payer: Self-pay

## 2020-09-08 ENCOUNTER — Ambulatory Visit
Admission: RE | Admit: 2020-09-08 | Discharge: 2020-09-08 | Disposition: A | Discharge status: Home / Self Care | Payer: 59 | Source: Ambulatory Visit | Attending: Internal Medicine | Admitting: Internal Medicine

## 2020-09-08 ENCOUNTER — Other Ambulatory Visit: Payer: Self-pay

## 2020-09-08 DIAGNOSIS — Z0000 Encounter for general adult medical examination without abnormal findings: Secondary | ICD-10-CM

## 2020-09-08 IMAGING — MG MM DIGITAL SCREENING BILAT W/ TOMO AND CAD
6 of 10 series · 6 of 30 positions shown · non-contrast
Comparison: Previous exam(s).

CLINICAL DATA: Screening.

EXAM:
DIGITAL SCREENING BILATERAL MAMMOGRAM WITH TOMOSYNTHESIS AND CAD
TECHNIQUE: Bilateral screening digital craniocaudal and mediolateral oblique
mammograms were obtained. Bilateral screening digital breast
tomosynthesis was performed. The images were evaluated with
computer-aided detection.

[R CC synth-2D]
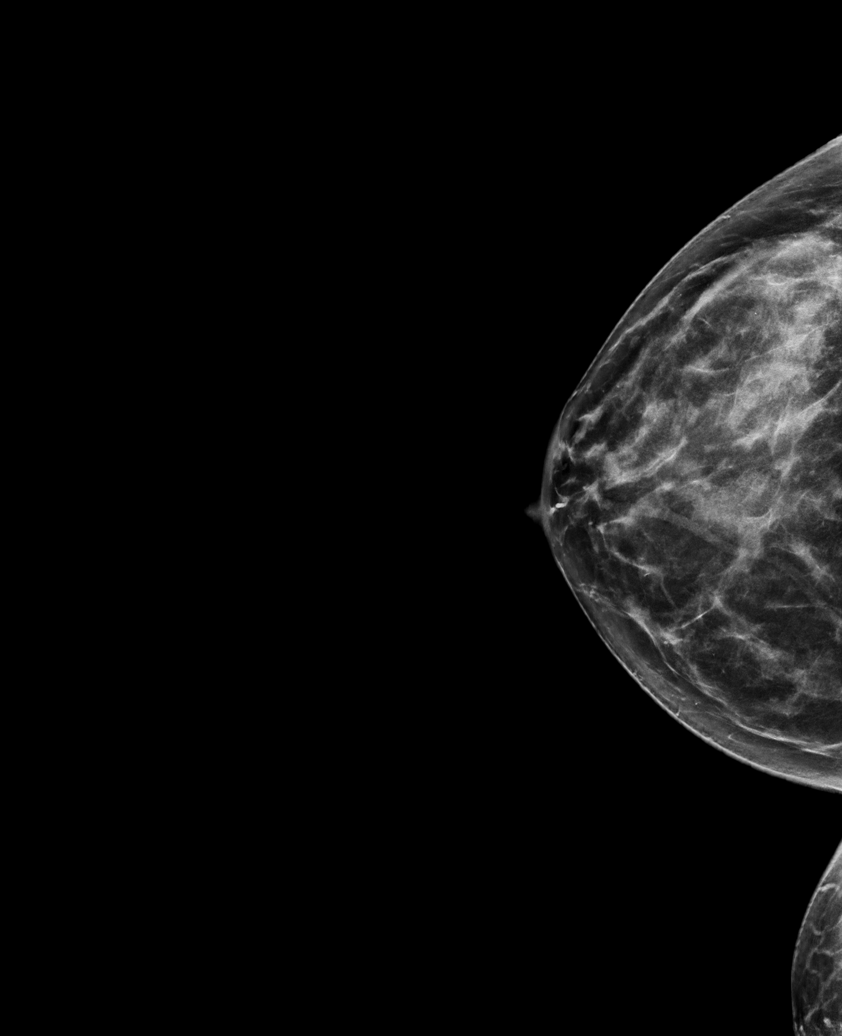

[L CC synth-2D]
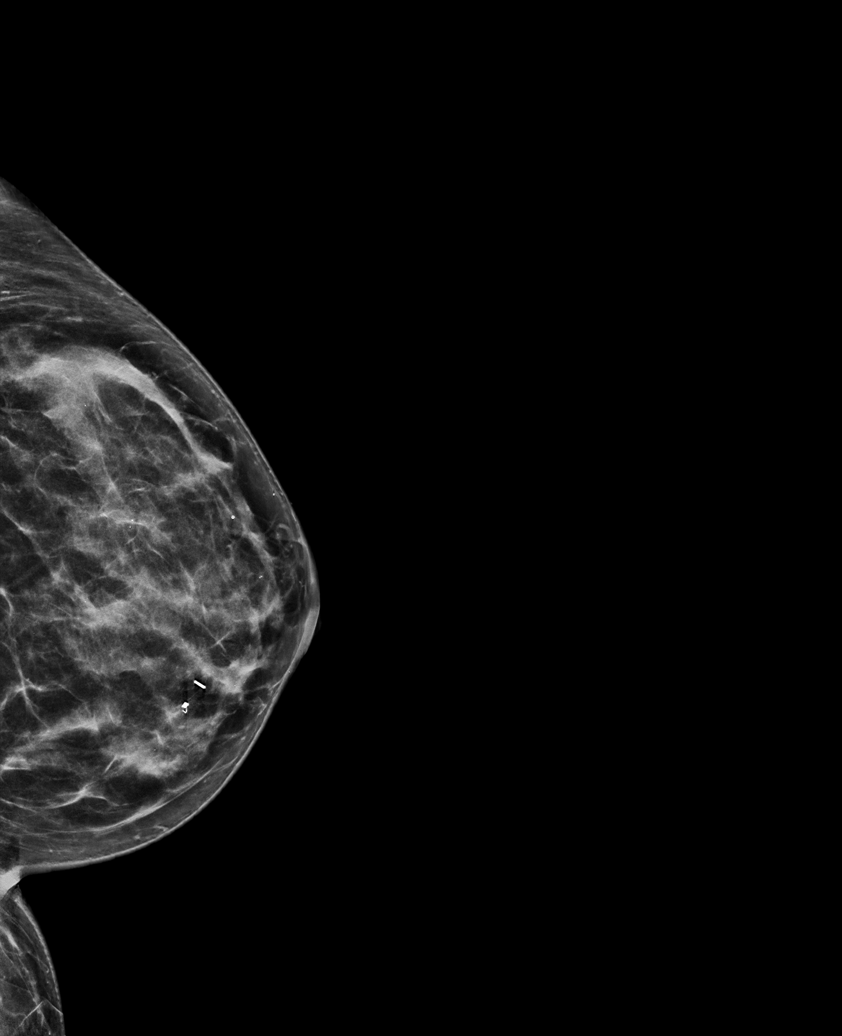

[L MLO synth-2D (1 of 2)]
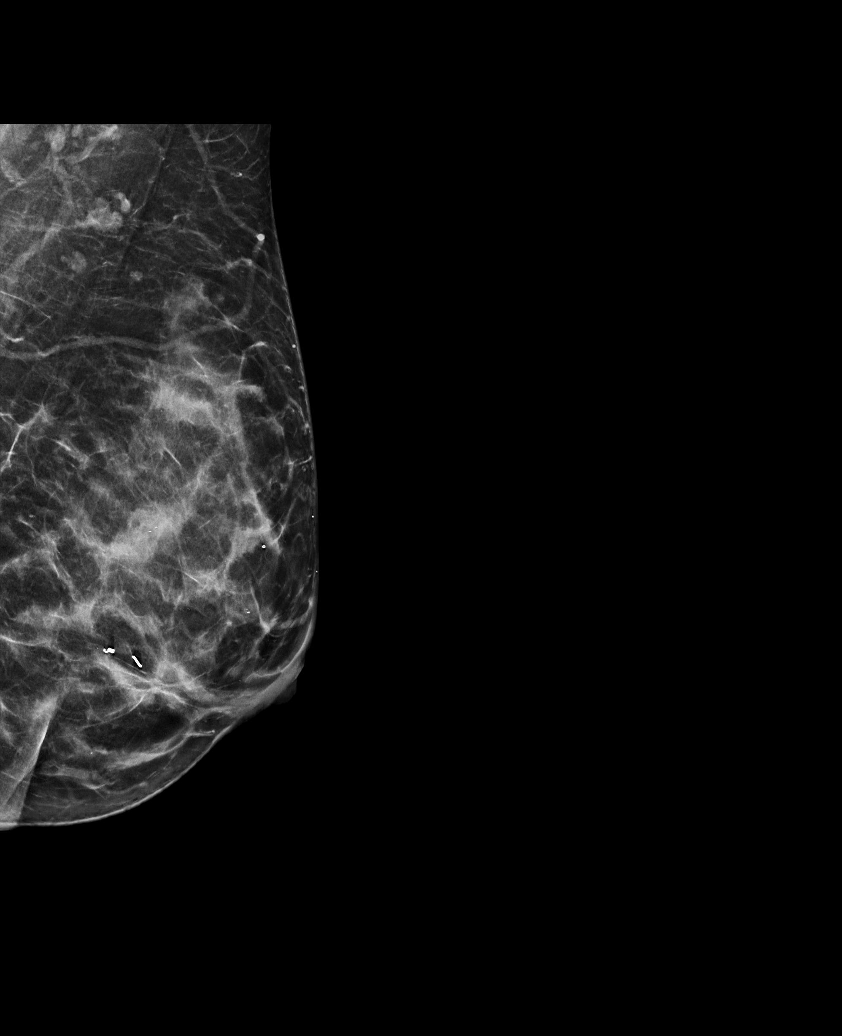

[L MLO synth-2D (2 of 2)]
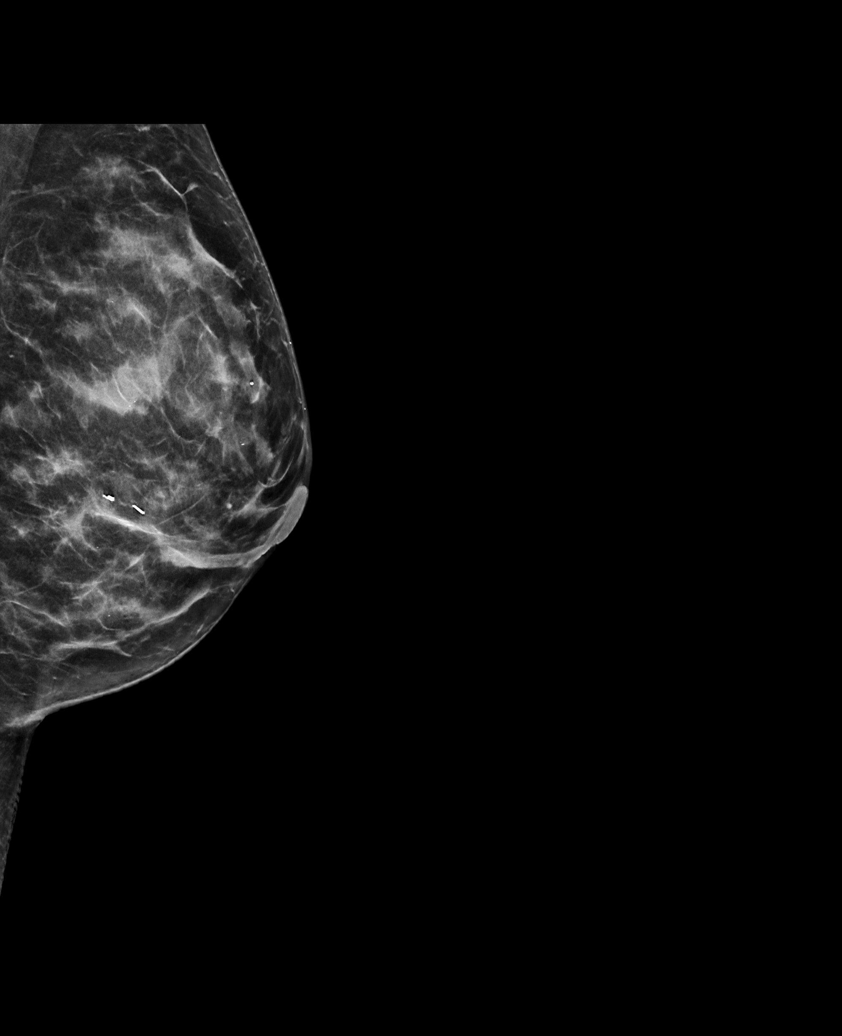

[R MLO synth-2D]
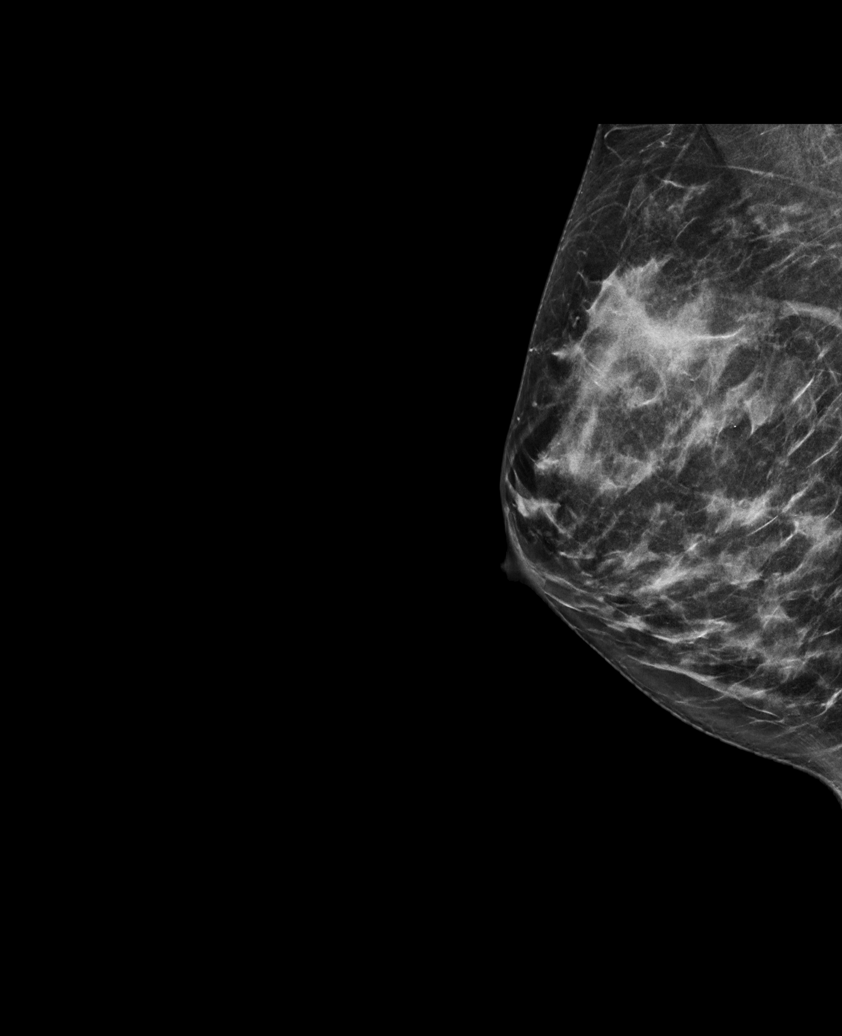

[R MLO tomo · tomo slice 32/63.0]
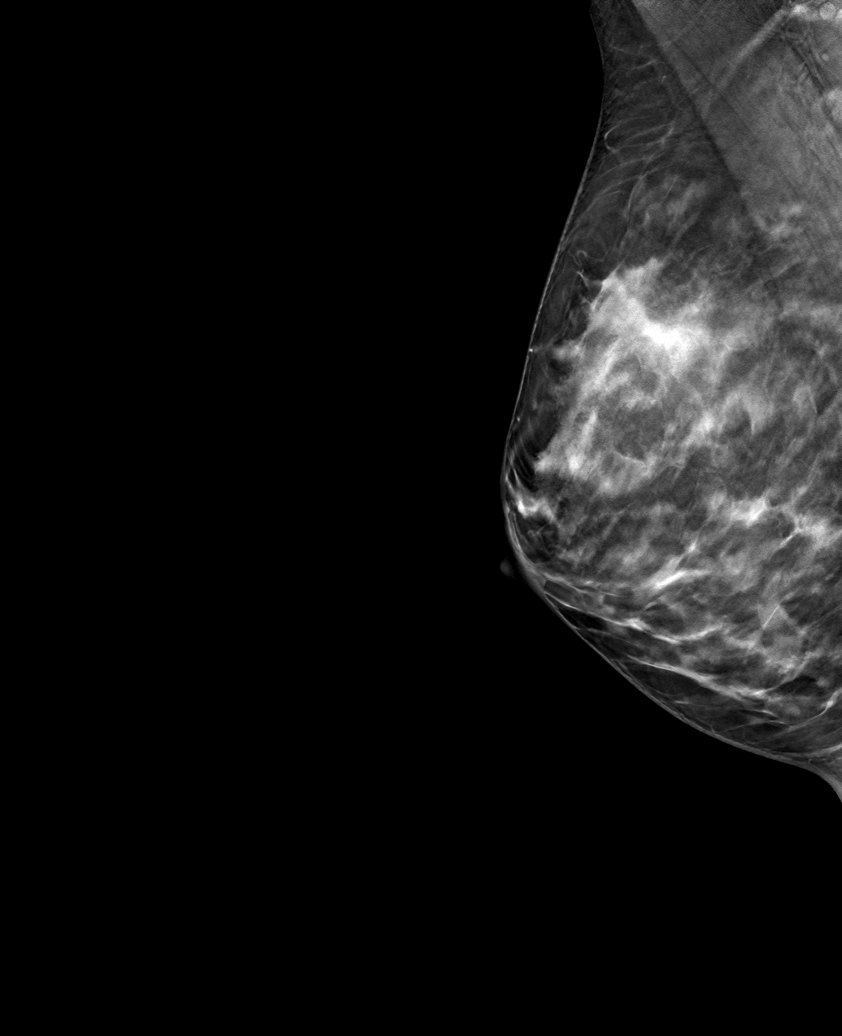

[6 of 30 positions shown; findings below may reference images not displayed]

ACR Breast Density Category c: The breast tissue is heterogeneously
dense, which may obscure small masses.
FINDINGS: In the left breast, a possible asymmetry warrants further
evaluation. In the right breast, no findings suspicious for
malignancy.
IMPRESSION: Further evaluation is suggested for possible asymmetry in the left
breast.

RECOMMENDATION:
Diagnostic mammogram and possibly ultrasound of the left breast.
(Code:[WN])

The patient will be contacted regarding the findings, and additional
imaging will be scheduled.

BI-RADS CATEGORY  0: Incomplete. Need additional imaging evaluation
and/or prior mammograms for comparison.

## 2020-09-13 ENCOUNTER — Ambulatory Visit: Payer: 59

## 2020-09-13 ENCOUNTER — Other Ambulatory Visit: Payer: Self-pay

## 2020-09-13 ENCOUNTER — Ambulatory Visit
Admission: RE | Admit: 2020-09-13 | Discharge: 2020-09-13 | Disposition: A | Discharge status: Home / Self Care | Payer: 59 | Source: Ambulatory Visit | Attending: Internal Medicine | Admitting: Internal Medicine

## 2020-09-13 ENCOUNTER — Other Ambulatory Visit: Payer: Self-pay | Admitting: Internal Medicine

## 2020-09-13 DIAGNOSIS — R928 Other abnormal and inconclusive findings on diagnostic imaging of breast: Secondary | ICD-10-CM

## 2020-09-13 IMAGING — MG MM DIGITAL DIAGNOSTIC UNILAT*L* W/ TOMO W/ CAD
6 series · 6 of 18 positions shown · non-contrast
Comparison: Previous exam(s).

CLINICAL DATA: Recall from screening mammography, possible
asymmetry in the retroareolar LEFT breast at MIDDLE depth visible
only on the MLO image. Personal history of benign excisional biopsy
from the LEFT breast in [IZ].

EXAM:
DIGITAL DIAGNOSTIC UNILATERAL LEFT MAMMOGRAM WITH TOMOSYNTHESIS AND
CAD
TECHNIQUE: Left digital diagnostic mammography and breast tomosynthesis was
performed. The images were evaluated with computer-aided detection.

[L MLO synth-2D (1 of 2)]
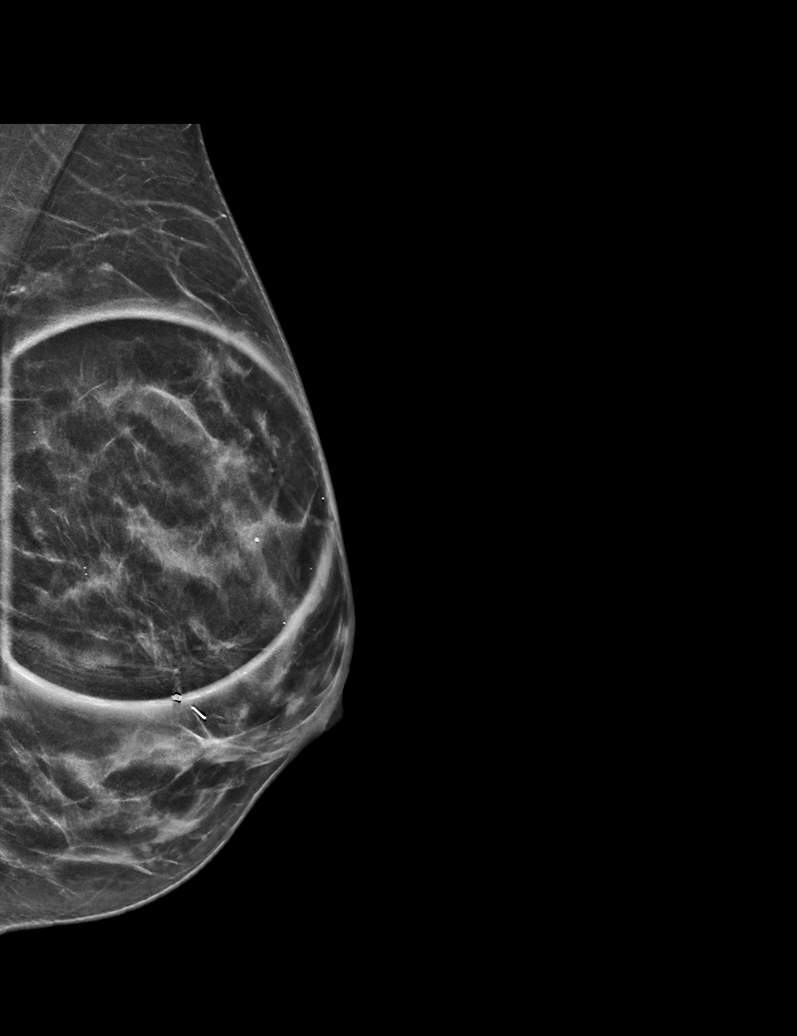

[L MLO synth-2D (2 of 2)]
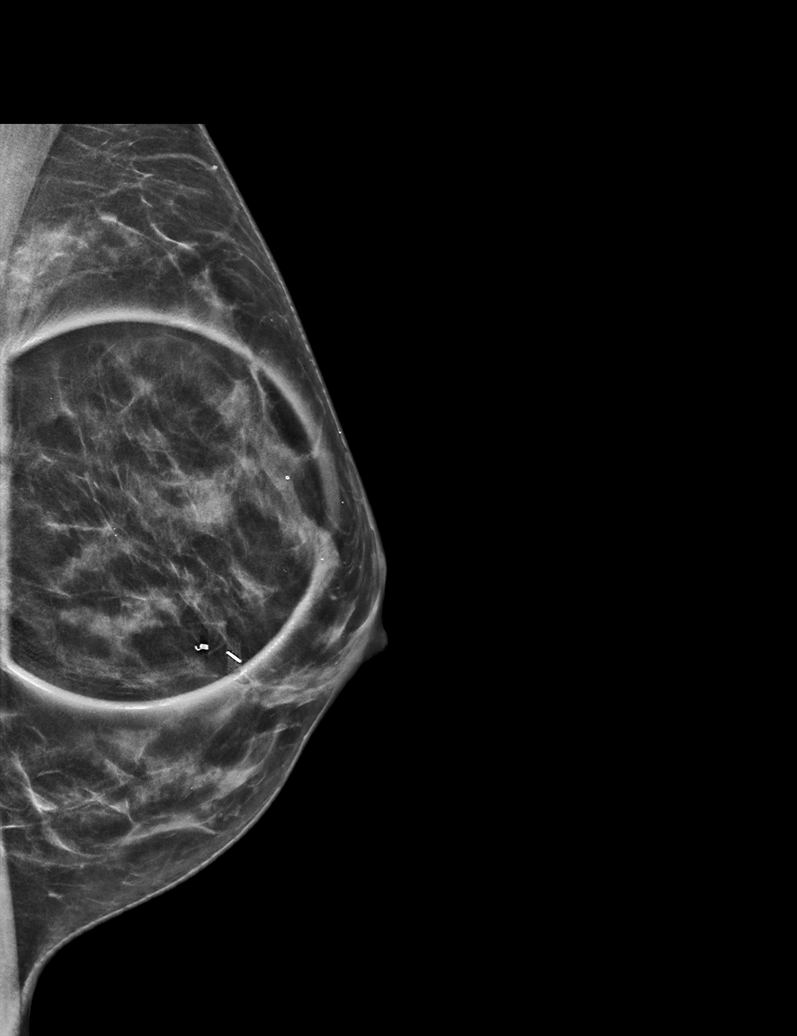

[L ML synth-2D]
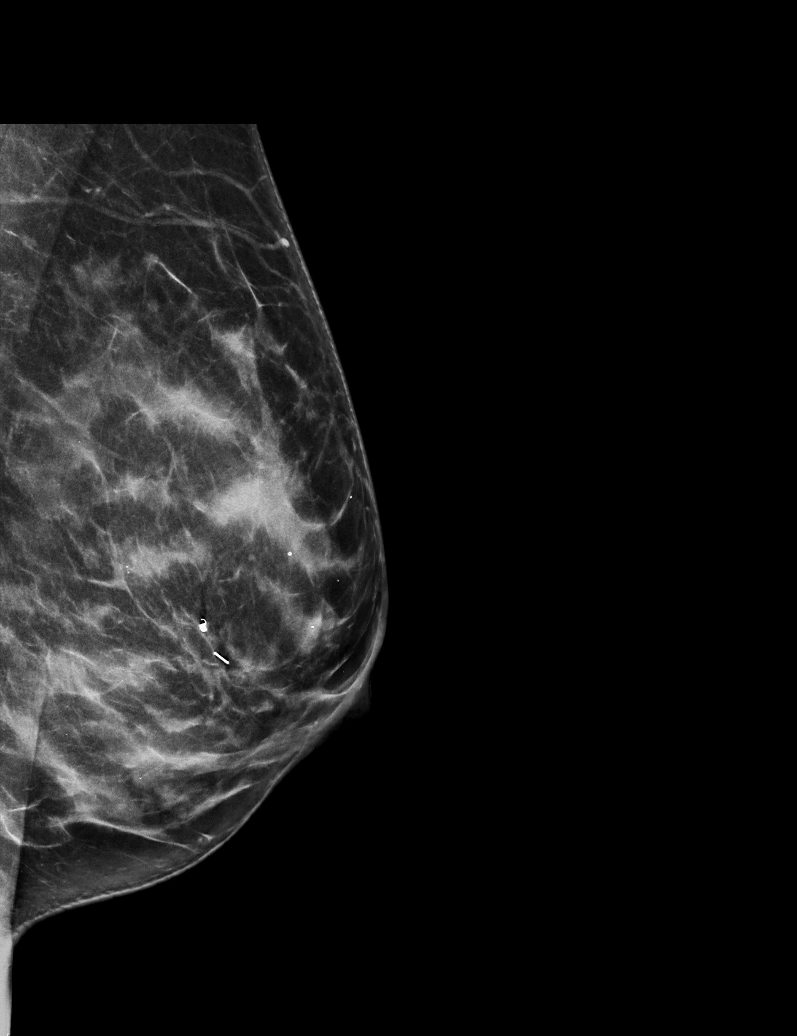

[L MLO tomo (1 of 2) · tomo slice 29/57.0]
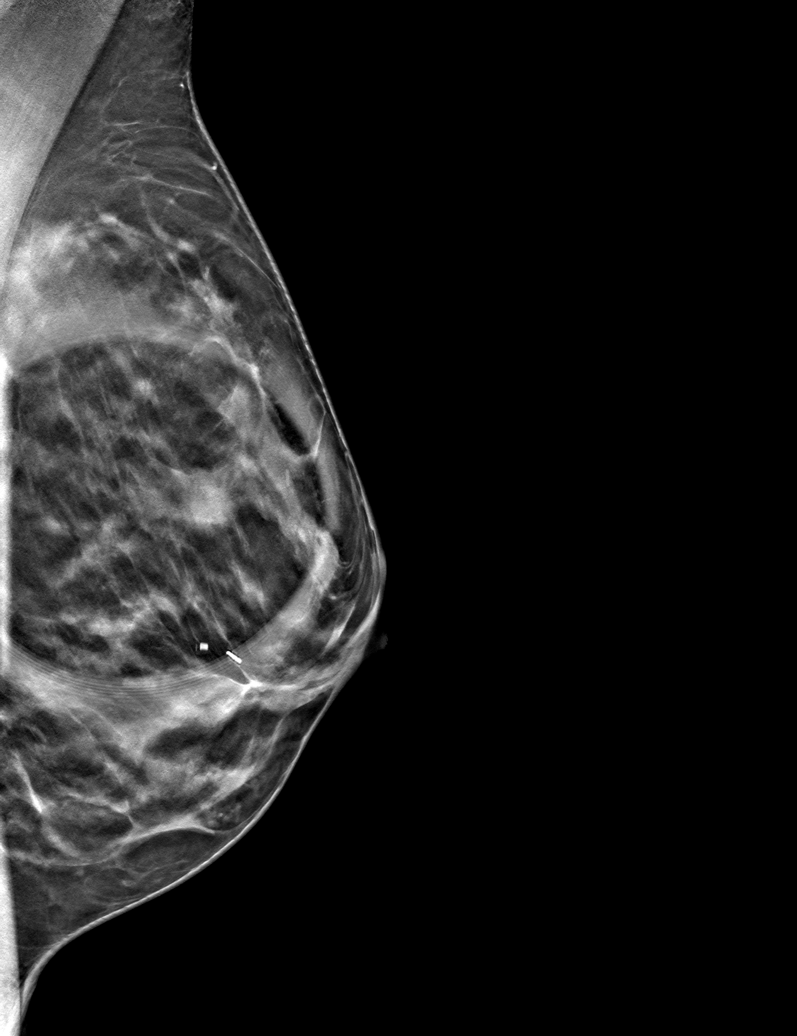

[L ML tomo · tomo slice 33/64.0]
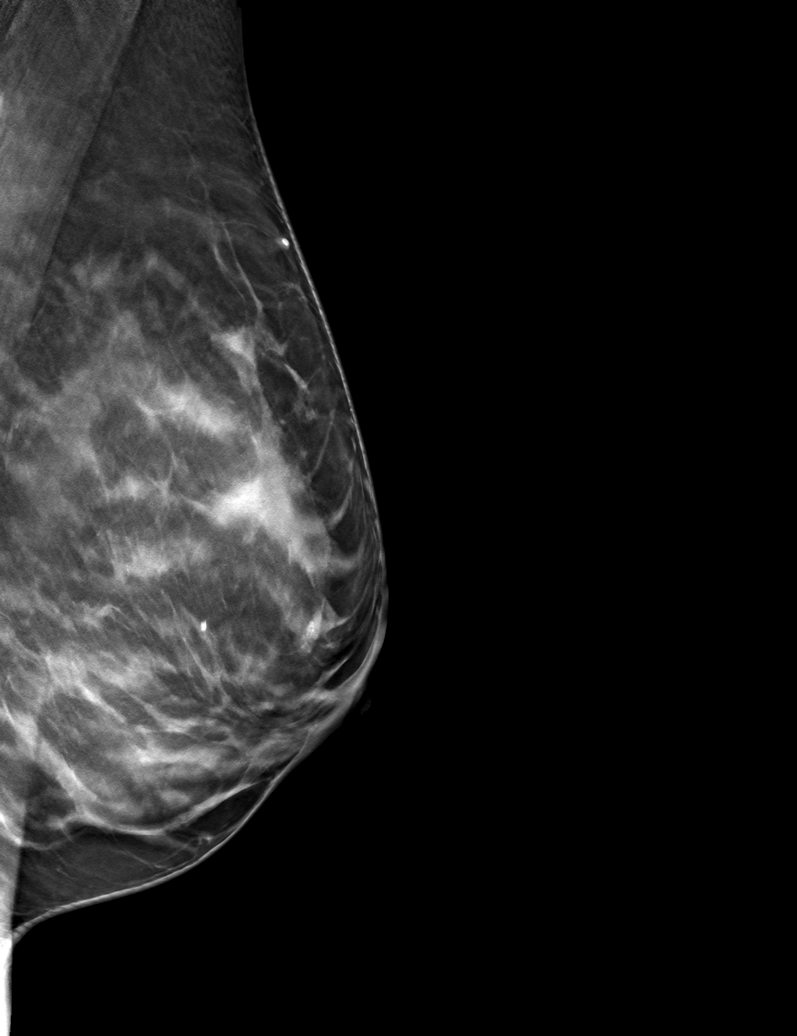

[L MLO tomo (2 of 2) · tomo slice 31/60.0]
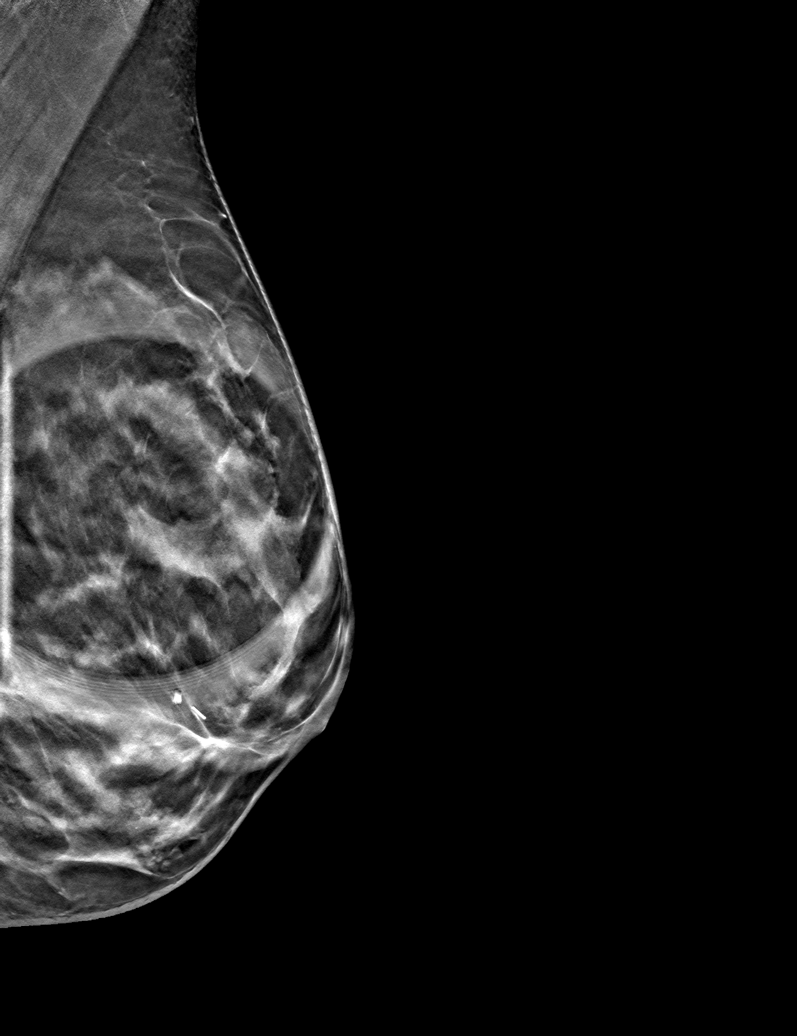

[6 of 18 positions shown; findings below may reference images not displayed]

ACR Breast Density Category c: The breast tissue is heterogeneously
dense, which may obscure small masses.
FINDINGS: Spot-compression MLO view of the area of concern and a full field
mediolateral view were obtained.

The asymmetry in the retroareolar location disperses with
compression, indicating overlapping fibroglandular tissue. There is
no underlying mass or non-surgical architectural distortion. The
posterosuperior margin of the surgical scar from the prior
excisional biopsy is present adjacent to the questioned asymmetry.
IMPRESSION: No mammographic evidence of malignancy involving the LEFT breast.

RECOMMENDATION:
Screening mammogram in one year.(Code:[IZ])

I have discussed the findings and recommendations with the patient.
If applicable, a reminder letter will be sent to the patient
regarding the next appointment.

BI-RADS CATEGORY  1: Negative.

## 2020-09-14 ENCOUNTER — Other Ambulatory Visit: Payer: Self-pay | Admitting: Internal Medicine

## 2020-10-26 ENCOUNTER — Inpatient Hospital Stay (HOSPITAL_COMMUNITY): Payer: 59 | Admitting: Registered Nurse

## 2020-10-26 ENCOUNTER — Other Ambulatory Visit: Payer: Self-pay

## 2020-10-26 ENCOUNTER — Emergency Department (HOSPITAL_COMMUNITY): Payer: 59

## 2020-10-26 ENCOUNTER — Encounter (HOSPITAL_COMMUNITY): Payer: Self-pay

## 2020-10-26 ENCOUNTER — Encounter (HOSPITAL_COMMUNITY): Admission: EM | Disposition: A | Discharge status: Home / Self Care | Payer: Self-pay | Source: Home / Self Care

## 2020-10-26 ENCOUNTER — Inpatient Hospital Stay (HOSPITAL_COMMUNITY)
Admission: EM | Admit: 2020-10-26 | Discharge: 2020-11-02 | DRG: 331 | Disposition: A | Discharge status: Home / Self Care | Payer: 59 | Attending: General Surgery | Admitting: General Surgery

## 2020-10-26 DIAGNOSIS — Z8249 Family history of ischemic heart disease and other diseases of the circulatory system: Secondary | ICD-10-CM | POA: Diagnosis not present

## 2020-10-26 DIAGNOSIS — Z20822 Contact with and (suspected) exposure to covid-19: Secondary | ICD-10-CM | POA: Diagnosis present

## 2020-10-26 DIAGNOSIS — K567 Ileus, unspecified: Secondary | ICD-10-CM | POA: Diagnosis not present

## 2020-10-26 DIAGNOSIS — F32A Depression, unspecified: Secondary | ICD-10-CM | POA: Diagnosis present

## 2020-10-26 DIAGNOSIS — F419 Anxiety disorder, unspecified: Secondary | ICD-10-CM | POA: Diagnosis present

## 2020-10-26 DIAGNOSIS — Z8049 Family history of malignant neoplasm of other genital organs: Secondary | ICD-10-CM | POA: Diagnosis not present

## 2020-10-26 DIAGNOSIS — K562 Volvulus: Secondary | ICD-10-CM | POA: Diagnosis present

## 2020-10-26 DIAGNOSIS — Z87891 Personal history of nicotine dependence: Secondary | ICD-10-CM | POA: Diagnosis not present

## 2020-10-26 DIAGNOSIS — Z86718 Personal history of other venous thrombosis and embolism: Secondary | ICD-10-CM | POA: Diagnosis not present

## 2020-10-26 DIAGNOSIS — Z803 Family history of malignant neoplasm of breast: Secondary | ICD-10-CM

## 2020-10-26 DIAGNOSIS — Z825 Family history of asthma and other chronic lower respiratory diseases: Secondary | ICD-10-CM

## 2020-10-26 DIAGNOSIS — Z79899 Other long term (current) drug therapy: Secondary | ICD-10-CM

## 2020-10-26 DIAGNOSIS — R339 Retention of urine, unspecified: Secondary | ICD-10-CM | POA: Diagnosis not present

## 2020-10-26 HISTORY — PX: PARTIAL COLECTOMY: SHX5273

## 2020-10-26 HISTORY — PX: LAPAROTOMY: SHX154

## 2020-10-26 LAB — COMPREHENSIVE METABOLIC PANEL
ALT: 9 U/L (ref 0–44)
AST: 16 U/L (ref 15–41)
Albumin: 4.4 g/dL (ref 3.5–5.0)
Alkaline Phosphatase: 43 U/L (ref 38–126)
Anion gap: 8 (ref 5–15)
BUN: 12 mg/dL (ref 6–20)
CO2: 26 mmol/L (ref 22–32)
Calcium: 9.5 mg/dL (ref 8.9–10.3)
Chloride: 103 mmol/L (ref 98–111)
Creatinine, Ser: 0.7 mg/dL (ref 0.44–1.00)
GFR, Estimated: >60 mL/min (ref >60)
Glucose, Bld: 140 mg/dL — ABNORMAL HIGH (ref 70–99)
Potassium: 3.8 mmol/L (ref 3.5–5.1)
Sodium: 137 mmol/L (ref 135–145)
Total Bilirubin: 1.1 mg/dL (ref 0.3–1.2)
Total Protein: 6.9 g/dL (ref 6.5–8.1)

## 2020-10-26 LAB — CBC WITH DIFFERENTIAL/PLATELET
Abs Immature Granulocytes: 0.03 10*3/uL (ref 0.00–0.07)
Basophils Absolute: 0 10*3/uL (ref 0.0–0.1)
Basophils Relative: 0 %
Eosinophils Absolute: 0 10*3/uL (ref 0.0–0.5)
Eosinophils Relative: 0 %
HCT: 41.1 % (ref 36.0–46.0)
Hemoglobin: 13.9 g/dL (ref 12.0–15.0)
Immature Granulocytes: 0 %
Lymphocytes Relative: 11 %
Lymphs Abs: 1.1 10*3/uL (ref 0.7–4.0)
MCH: 31.7 pg (ref 26.0–34.0)
MCHC: 33.8 g/dL (ref 30.0–36.0)
MCV: 93.6 fL (ref 80.0–100.0)
Monocytes Absolute: 0.7 10*3/uL (ref 0.1–1.0)
Monocytes Relative: 7 %
Neutro Abs: 8.5 10*3/uL — ABNORMAL HIGH (ref 1.7–7.7)
Neutrophils Relative %: 82 %
Platelets: 233 10*3/uL (ref 150–400)
RBC: 4.39 MIL/uL (ref 3.87–5.11)
RDW: 13.4 % (ref 11.5–15.5)
WBC: 10.4 10*3/uL (ref 4.0–10.5)
nRBC: 0 % (ref 0.0–0.2)

## 2020-10-26 LAB — I-STAT BETA HCG BLOOD, ED (MC, WL, AP ONLY): I-stat hCG, quantitative: <5 m[IU]/mL (ref <5)

## 2020-10-26 LAB — RESP PANEL BY RT-PCR (FLU A&B, COVID) ARPGX2
Influenza A by PCR: NEGATIVE
Influenza B by PCR: NEGATIVE
SARS Coronavirus 2 by RT PCR: NEGATIVE

## 2020-10-26 LAB — LIPASE, BLOOD: Lipase: 29 U/L (ref 11–51)

## 2020-10-26 IMAGING — CT CT ABD-PELV W/ CM
2 of 5 series · 13 of 46 positions shown, 15 images · IV contrast (OMNIPAQUE 350)
Comparison: None.
COMPARISON: None.

Addendum:
CLINICAL DATA: Abdominal pain, bowel obstruction suspected.

EXAM:
CT ABDOMEN AND PELVIS WITH CONTRAST
TECHNIQUE: Multidetector CT imaging of the abdomen and pelvis was performed
using the standard protocol following bolus administration of
intravenous contrast.
CONTRAST:  80mL OMNIPAQUE IOHEXOL 350 MG/ML SOLN

[Series 2: axial st · axial · 0.68mm/px · z∈[-493,-133]mm · 10 of 86 slices shown, 12 images]
[im 7/86  soft-tissue]
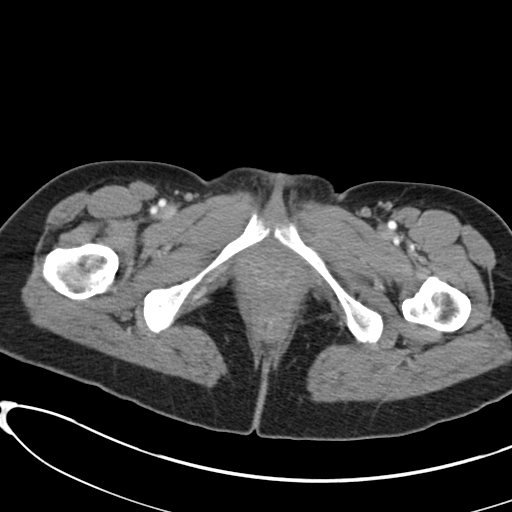
[im 7/86  bone]
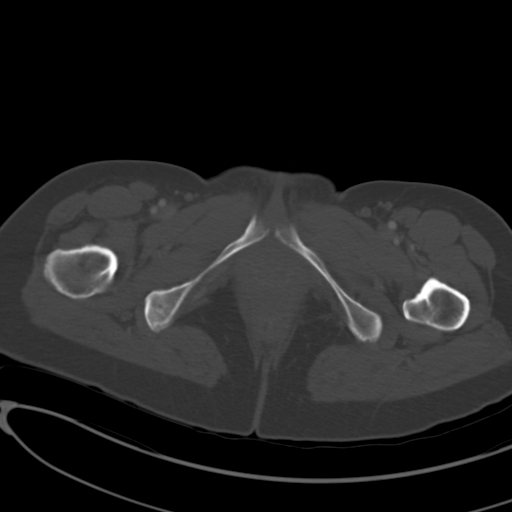
[im 13/86  soft-tissue]
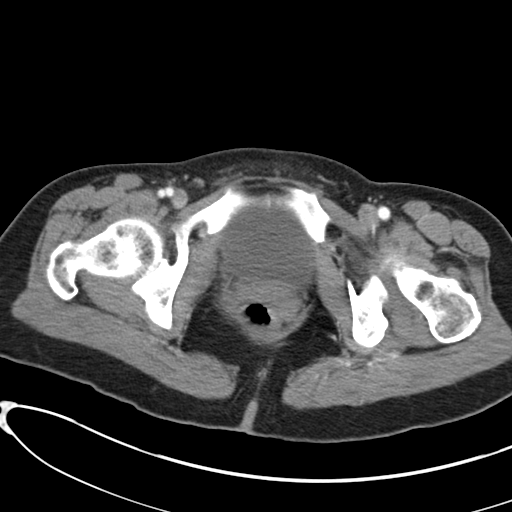
[im 25/86  soft-tissue]
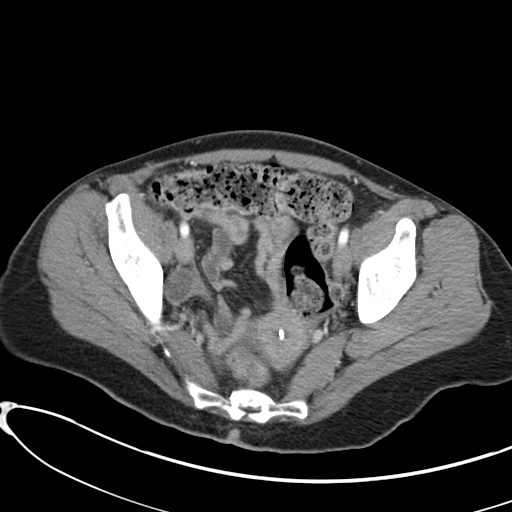
[im 31/86  soft-tissue]
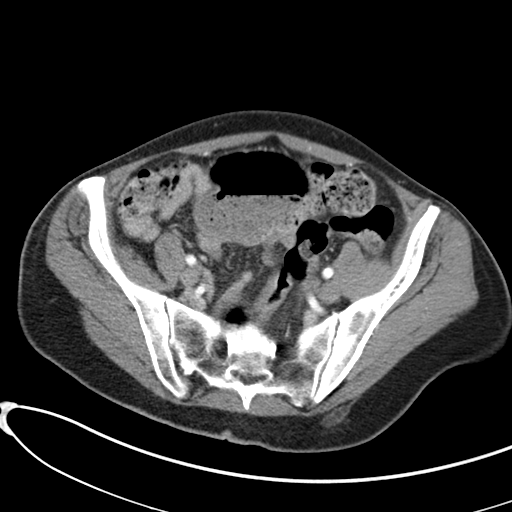
[im 37/86  soft-tissue]
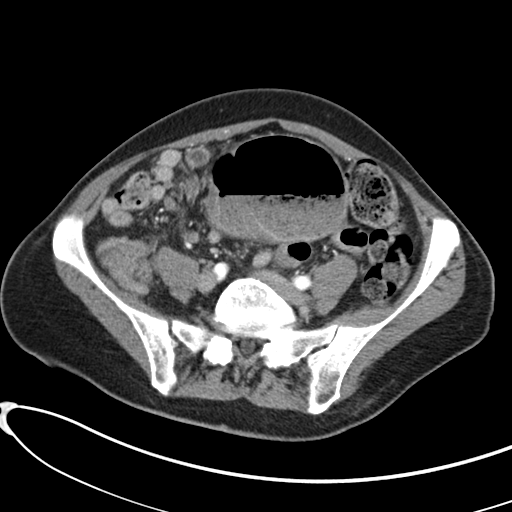
[im 49/86  soft-tissue]
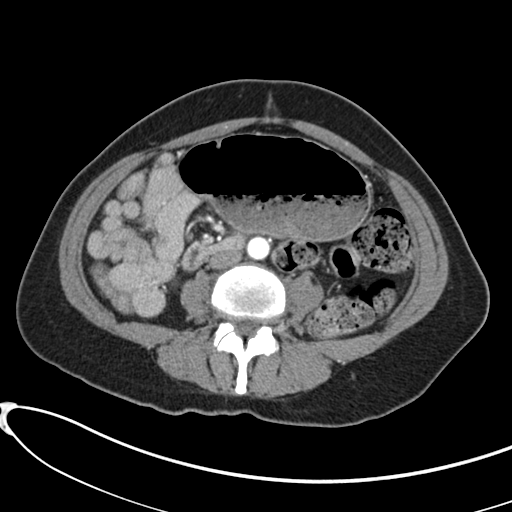
[im 55/86  soft-tissue]
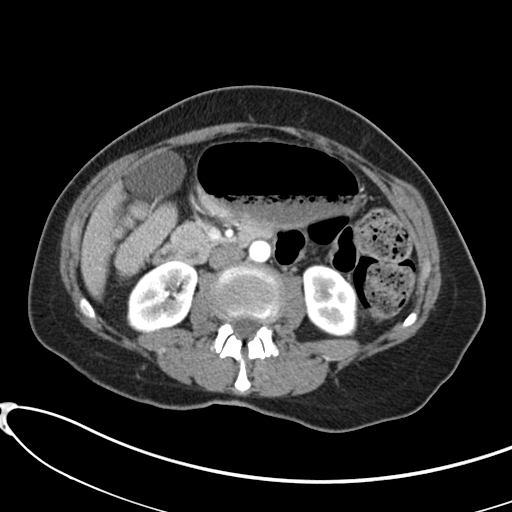
[im 61/86  soft-tissue]
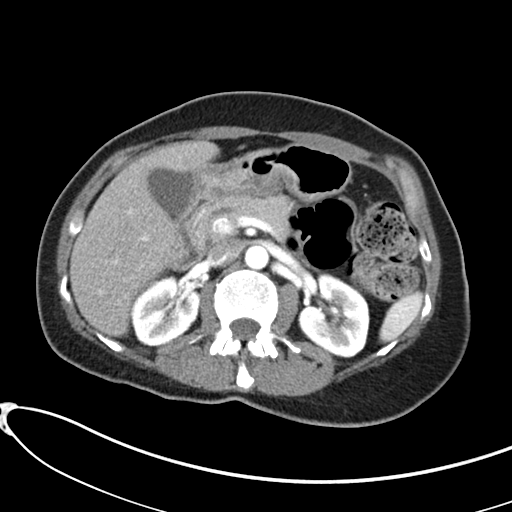
[im 73/86  soft-tissue]
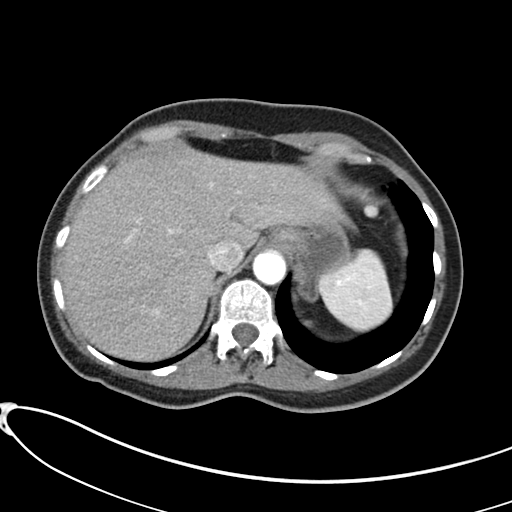
[im 73/86  bone]
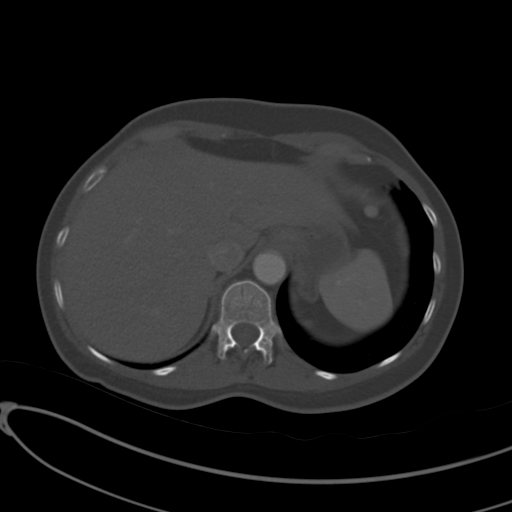
[im 79/86  soft-tissue]
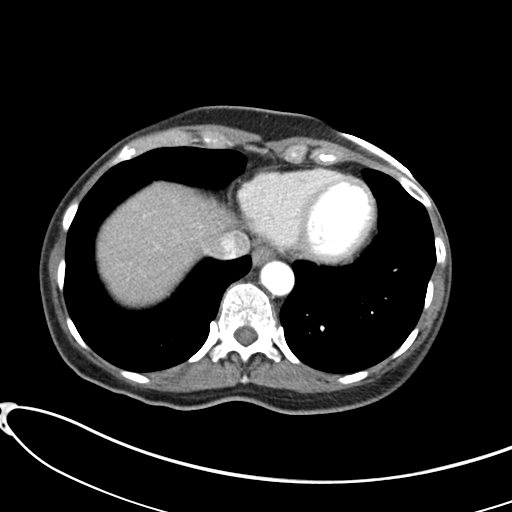

[Series 5: coronal st · coronal · 0.71mm/px · 3 of 148 slices shown]
[im 50/148  soft-tissue]
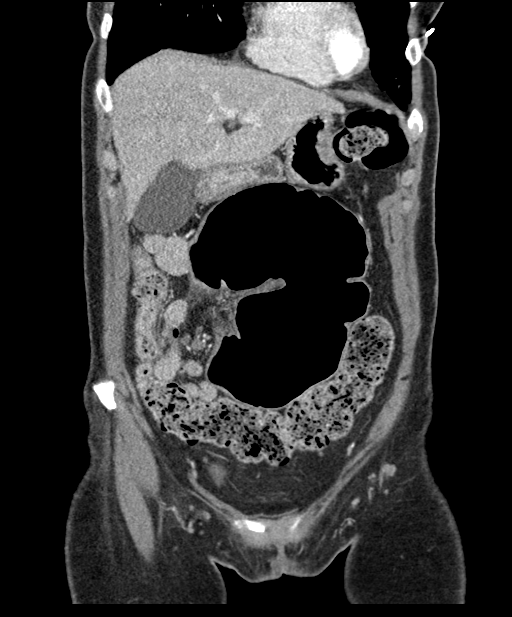
[im 66/148  soft-tissue]
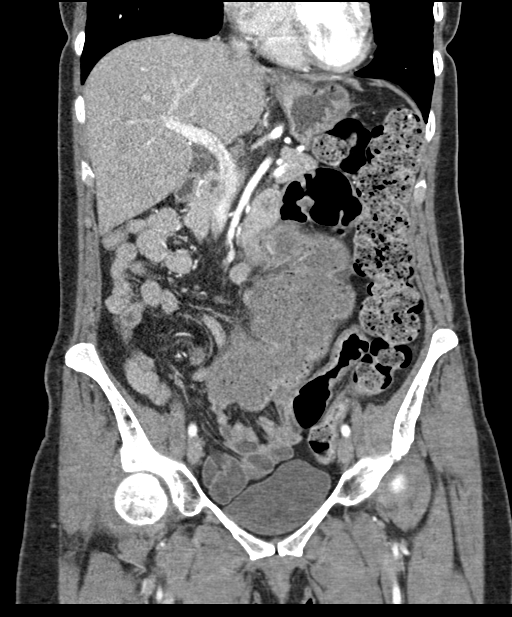
[im 82/148  soft-tissue]
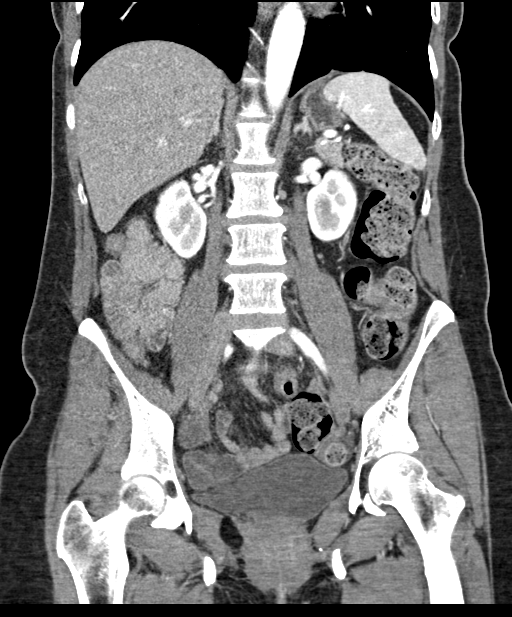

[13 of 46 positions shown; findings below may reference images not displayed]

FINDINGS: Lower chest: No acute abnormality.

Hepatobiliary: Diffuse hepatic steatosis. Gallbladder is
unremarkable. No biliary ductal dilation.

Pancreas: Prominence of the pancreatic duct measuring 3.5 mm in the
head of the pancreas and 3 mm in the body, without discrete
obstructing lesion visualized. No evidence of pancreatic parenchymal
atrophy.

Spleen: Within normal limits.

Adrenals/Urinary Tract: Adrenal glands are unremarkable. Kidneys are
normal, without renal calculi, solid enhancing lesion, or
hydronephrosis. Bladder is unremarkable.

Stomach/Bowel: Sigmoid volvulus with twisting of the sigmoid on the
root best seen on coronal image 52-75, with the sigmoid colon
measuring 10 cm in maximum dimension. There is adjacent inflammatory
stranding without significant wall thickening or hypoenhancement.

Stomach is grossly unremarkable. No pathologic dilation of small
bowel. Large volume of stool throughout the remaining colon.

Vascular/Lymphatic: No abdominal aortic aneurysm. No pathologically
enlarged abdominal or pelvic lymph nodes.

Reproductive: Intrauterine device which appears to be in appropriate
position. Simple appearing 2.3 cm right ovarian cyst. No follow-up
imaging recommended. Note: This recommendation does not apply to
premenarchal patients and to those with increased risk (genetic,
family history, elevated tumor markers or other high-risk factors)
of ovarian cancer. Reference: JACR [DATE]):[id number]

Other: Right lower quadrant mesenteric edema. No significant
abdominopelvic ascites. No pneumoperitoneum.

Musculoskeletal: L5-S1 discogenic disease. No acute osseous
abnormality.
IMPRESSION: 1. Sigmoid volvulus with the sigmoid colon measuring 10 cm in
maximum dimension, with adjacent inflammatory stranding but without
current CT findings of ischemia. No evidence of perforation.
2. Large volume of stool throughout the remaining colon.
3. Diffuse hepatic steatosis.
4. Prominence of the pancreatic duct measuring up to 3.5 mm in the
head of the pancreas and 3 mm in the body, without discrete
obstructing lesion visualized. Consider nonemergent MRCP with and
without contrast for further evaluation.

ADDENDUM:
Upon re-review of CT abdomen and pelvis performed [DATE], the
following findings are noted: There is an error in findings and
impression section of the report regarding the volvulus loop of
bowel. It should state:

CECAL volvulus measuring up to 10 cm in maximum dimension with
adjacent inflammatory stranding but without current CT findings of
ischemia.

*** End of Addendum ***
FINDINGS: Lower chest: No acute abnormality.

Hepatobiliary: Diffuse hepatic steatosis. Gallbladder is
unremarkable. No biliary ductal dilation.

Pancreas: Prominence of the pancreatic duct measuring 3.5 mm in the
head of the pancreas and 3 mm in the body, without discrete
obstructing lesion visualized. No evidence of pancreatic parenchymal
atrophy.

Spleen: Within normal limits.

Adrenals/Urinary Tract: Adrenal glands are unremarkable. Kidneys are
normal, without renal calculi, solid enhancing lesion, or
hydronephrosis. Bladder is unremarkable.

Stomach/Bowel: Sigmoid volvulus with twisting of the sigmoid on the
root best seen on coronal image 52-75, with the sigmoid colon
measuring 10 cm in maximum dimension. There is adjacent inflammatory
stranding without significant wall thickening or hypoenhancement.

Stomach is grossly unremarkable. No pathologic dilation of small
bowel. Large volume of stool throughout the remaining colon.

Vascular/Lymphatic: No abdominal aortic aneurysm. No pathologically
enlarged abdominal or pelvic lymph nodes.

Reproductive: Intrauterine device which appears to be in appropriate
position. Simple appearing 2.3 cm right ovarian cyst. No follow-up
imaging recommended. Note: This recommendation does not apply to
premenarchal patients and to those with increased risk (genetic,
family history, elevated tumor markers or other high-risk factors)
of ovarian cancer. Reference: JACR [DATE]):[id number]

Other: Right lower quadrant mesenteric edema. No significant
abdominopelvic ascites. No pneumoperitoneum.

Musculoskeletal: L5-S1 discogenic disease. No acute osseous
abnormality.
IMPRESSION: 1. Sigmoid volvulus with the sigmoid colon measuring 10 cm in
maximum dimension, with adjacent inflammatory stranding but without
current CT findings of ischemia. No evidence of perforation.
2. Large volume of stool throughout the remaining colon.
3. Diffuse hepatic steatosis.
4. Prominence of the pancreatic duct measuring up to 3.5 mm in the
head of the pancreas and 3 mm in the body, without discrete
obstructing lesion visualized. Consider nonemergent MRCP with and
without contrast for further evaluation.

## 2020-10-26 SURGERY — LAPAROTOMY, EXPLORATORY
Anesthesia: General | Site: Abdomen | Laterality: Right

## 2020-10-26 MED ORDER — FENTANYL CITRATE (PF) 250 MCG/5ML IJ SOLN
INTRAMUSCULAR | Status: AC
  Filled 2020-10-26: qty 5

## 2020-10-26 MED ORDER — ONDANSETRON HCL 4 MG/2ML IJ SOLN
INTRAMUSCULAR | Status: AC
  Filled 2020-10-26: qty 2

## 2020-10-26 MED ORDER — ARTIFICIAL TEARS OPHTHALMIC OINT
TOPICAL_OINTMENT | OPHTHALMIC | Status: AC
  Filled 2020-10-26: qty 3.5

## 2020-10-26 MED ORDER — LIDOCAINE 2% (20 MG/ML) 5 ML SYRINGE
INTRAMUSCULAR | Status: AC
  Filled 2020-10-26: qty 5

## 2020-10-26 MED ORDER — FENTANYL CITRATE (PF) 100 MCG/2ML IJ SOLN
INTRAMUSCULAR | Status: DC | PRN
  Administered 2020-10-26 (×5): 50 ug via INTRAVENOUS

## 2020-10-26 MED ORDER — PROPOFOL 10 MG/ML IV BOLUS
INTRAVENOUS | Status: AC
  Filled 2020-10-26: qty 20

## 2020-10-26 MED ORDER — SODIUM CHLORIDE (PF) 0.9 % IJ SOLN
INTRAMUSCULAR | Status: AC
  Filled 2020-10-26: qty 50

## 2020-10-26 MED ORDER — ONDANSETRON 4 MG PO TBDP: 4.0000 mg | ORAL_TABLET | Freq: Once | ORAL | Status: DC

## 2020-10-26 MED ORDER — HYDROMORPHONE HCL 1 MG/ML IJ SOLN: 1.0000 mg | INTRAMUSCULAR | Status: DC | PRN

## 2020-10-26 MED ORDER — SUGAMMADEX SODIUM 200 MG/2ML IV SOLN
INTRAVENOUS | Status: DC | PRN
  Administered 2020-10-26: 100 mg via INTRAVENOUS

## 2020-10-26 MED ORDER — MIDAZOLAM HCL 2 MG/2ML IJ SOLN
INTRAMUSCULAR | Status: AC
  Filled 2020-10-26: qty 4

## 2020-10-26 MED ORDER — CEFAZOLIN SODIUM-DEXTROSE 1-4 GM/50ML-% IV SOLN
1.0000 g | Freq: Once | INTRAVENOUS | Status: DC
  Filled 2020-10-26: qty 50

## 2020-10-26 MED ORDER — IOHEXOL 350 MG/ML SOLN
80.0000 mL | Freq: Once | INTRAVENOUS | Status: AC | PRN
  Administered 2020-10-26: 80 mL via INTRAVENOUS

## 2020-10-26 MED ORDER — ROCURONIUM BROMIDE 10 MG/ML (PF) SYRINGE
PREFILLED_SYRINGE | INTRAVENOUS | Status: DC | PRN
  Administered 2020-10-26: 40 mg via INTRAVENOUS

## 2020-10-26 MED ORDER — ONDANSETRON HCL 4 MG/2ML IJ SOLN
4.0000 mg | Freq: Once | INTRAMUSCULAR | Status: AC
  Administered 2020-10-26: 4 mg via INTRAVENOUS
  Filled 2020-10-26: qty 2

## 2020-10-26 MED ORDER — HYDROMORPHONE HCL 1 MG/ML IJ SOLN
1.0000 mg | Freq: Once | INTRAMUSCULAR | Status: AC
  Administered 2020-10-26: 1 mg via INTRAVENOUS
  Filled 2020-10-26: qty 1

## 2020-10-26 MED ORDER — OXYCODONE-ACETAMINOPHEN 5-325 MG PO TABS
1.0000 | ORAL_TABLET | Freq: Once | ORAL | Status: DC
Start: 2020-10-26 — End: 2020-10-26

## 2020-10-26 MED ORDER — CEFAZOLIN SODIUM-DEXTROSE 1-4 GM/50ML-% IV SOLN
1.0000 g | INTRAVENOUS | Status: AC
  Administered 2020-10-26 (×2): 1 g via INTRAVENOUS
  Filled 2020-10-26: qty 50

## 2020-10-26 MED ORDER — LACTATED RINGERS IV SOLN: INTRAVENOUS | Status: DC | PRN

## 2020-10-26 MED ORDER — ONDANSETRON HCL 4 MG/2ML IJ SOLN
INTRAMUSCULAR | Status: DC | PRN
  Administered 2020-10-26: 4 mg via INTRAVENOUS

## 2020-10-26 MED ORDER — PROPOFOL 10 MG/ML IV BOLUS
INTRAVENOUS | Status: DC | PRN
  Administered 2020-10-26: 70 mg via INTRAVENOUS

## 2020-10-26 MED ORDER — DEXMEDETOMIDINE (PRECEDEX) IN NS 20 MCG/5ML (4 MCG/ML) IV SYRINGE
PREFILLED_SYRINGE | INTRAVENOUS | Status: AC
  Filled 2020-10-26: qty 5

## 2020-10-26 MED ORDER — DEXAMETHASONE SODIUM PHOSPHATE 10 MG/ML IJ SOLN
INTRAMUSCULAR | Status: DC | PRN
  Administered 2020-10-26: 5 mg via INTRAVENOUS

## 2020-10-26 MED ORDER — MIDAZOLAM HCL 5 MG/5ML IJ SOLN
INTRAMUSCULAR | Status: DC | PRN
  Administered 2020-10-26: 2 mg via INTRAVENOUS

## 2020-10-26 MED ORDER — DEXAMETHASONE SODIUM PHOSPHATE 10 MG/ML IJ SOLN
INTRAMUSCULAR | Status: AC
  Filled 2020-10-26: qty 1

## 2020-10-26 MED ORDER — FENTANYL CITRATE (PF) 100 MCG/2ML IJ SOLN
50.0000 ug | Freq: Once | INTRAMUSCULAR | Status: AC
  Administered 2020-10-26: 50 ug via INTRAVENOUS
  Filled 2020-10-26: qty 2

## 2020-10-26 MED ORDER — DEXMEDETOMIDINE (PRECEDEX) IN NS 20 MCG/5ML (4 MCG/ML) IV SYRINGE
PREFILLED_SYRINGE | INTRAVENOUS | Status: DC | PRN
  Administered 2020-10-26 (×2): 8 ug via INTRAVENOUS
  Administered 2020-10-27: 4 ug via INTRAVENOUS

## 2020-10-26 MED ORDER — ROCURONIUM BROMIDE 10 MG/ML (PF) SYRINGE
PREFILLED_SYRINGE | INTRAVENOUS | Status: AC
  Filled 2020-10-26: qty 10

## 2020-10-26 MED ORDER — LIDOCAINE 2% (20 MG/ML) 5 ML SYRINGE
INTRAMUSCULAR | Status: DC | PRN
  Administered 2020-10-26: 100 mg via INTRAVENOUS

## 2020-10-26 MED ORDER — SUCCINYLCHOLINE CHLORIDE 200 MG/10ML IV SOSY
PREFILLED_SYRINGE | INTRAVENOUS | Status: AC
  Filled 2020-10-26: qty 10

## 2020-10-26 MED ORDER — SUCCINYLCHOLINE CHLORIDE 200 MG/10ML IV SOSY
PREFILLED_SYRINGE | INTRAVENOUS | Status: DC | PRN
  Administered 2020-10-26: 100 mg via INTRAVENOUS

## 2020-10-26 MED ORDER — 0.9 % SODIUM CHLORIDE (POUR BTL) OPTIME
TOPICAL | Status: DC | PRN
  Administered 2020-10-26: 2000 mL

## 2020-10-26 SURGICAL SUPPLY — 42 items
APL PRP STRL LF DISP 70% ISPRP (MISCELLANEOUS)
BAG COUNTER SPONGE SURGICOUNT (BAG) IMPLANT
BAG SPNG CNTER NS LX DISP (BAG)
CHLORAPREP W/TINT 26 (MISCELLANEOUS) IMPLANT
COVER MAYO STAND STRL (DRAPES) ×3 IMPLANT
COVER SURGICAL LIGHT HANDLE (MISCELLANEOUS) ×3 IMPLANT
DRAIN CHANNEL 19F RND (DRAIN) IMPLANT
DRAPE LAPAROSCOPIC ABDOMINAL (DRAPES) ×3 IMPLANT
DRSG OPSITE POSTOP 4X10 (GAUZE/BANDAGES/DRESSINGS) IMPLANT
DRSG OPSITE POSTOP 4X6 (GAUZE/BANDAGES/DRESSINGS) IMPLANT
DRSG OPSITE POSTOP 4X8 (GAUZE/BANDAGES/DRESSINGS) ×1 IMPLANT
ELECT REM PT RETURN 15FT ADLT (MISCELLANEOUS) ×3 IMPLANT
EVACUATOR SILICONE 100CC (DRAIN) IMPLANT
GLOVE SURG ENC MOIS LTX SZ6 (GLOVE) ×6 IMPLANT
GLOVE SURG MICRO LTX SZ6 (GLOVE) ×3 IMPLANT
GLOVE SURG UNDER LTX SZ6.5 (GLOVE) ×6 IMPLANT
GOWN STRL REUS W/TWL LRG LVL3 (GOWN DISPOSABLE) ×3 IMPLANT
GOWN STRL REUS W/TWL XL LVL3 (GOWN DISPOSABLE) ×3 IMPLANT
KIT TURNOVER KIT A (KITS) ×3 IMPLANT
LIGASURE IMPACT 36 18CM CVD LR (INSTRUMENTS) ×1 IMPLANT
PACK GENERAL/GYN (CUSTOM PROCEDURE TRAY) ×3 IMPLANT
PENCIL SMOKE EVACUATOR (MISCELLANEOUS) IMPLANT
RELOAD PROXIMATE 75MM BLUE (ENDOMECHANICALS) ×9 IMPLANT
RELOAD STAPLE 75 3.8 BLU REG (ENDOMECHANICALS) IMPLANT
RETRACTOR WND ALEXIS 25 LRG (MISCELLANEOUS) IMPLANT
RTRCTR WOUND ALEXIS 25CM LRG (MISCELLANEOUS) ×3
SPONGE T-LAP 18X18 ~~LOC~~+RFID (SPONGE) IMPLANT
STAPLER GUN LINEAR PROX 60 (STAPLE) ×1 IMPLANT
STAPLER VISISTAT 35W (STAPLE) IMPLANT
SUT ETHILON 3 0 PS 1 (SUTURE) IMPLANT
SUT MNCRL AB 4-0 PS2 18 (SUTURE) IMPLANT
SUT NOVA T20/GS 25 (SUTURE) IMPLANT
SUT SILK 2 0 (SUTURE) ×3
SUT SILK 2 0 SH CR/8 (SUTURE) ×4 IMPLANT
SUT SILK 2-0 18XBRD TIE 12 (SUTURE) ×2 IMPLANT
SUT SILK 3 0 (SUTURE) ×3
SUT SILK 3 0 SH CR/8 (SUTURE) ×3 IMPLANT
SUT SILK 3-0 18XBRD TIE 12 (SUTURE) ×2 IMPLANT
TOWEL OR 17X26 10 PK STRL BLUE (TOWEL DISPOSABLE) IMPLANT
TOWEL OR NON WOVEN STRL DISP B (DISPOSABLE) ×3 IMPLANT
TRAY FOLEY MTR SLVR 14FR STAT (SET/KITS/TRAYS/PACK) ×3 IMPLANT
TRAY FOLEY MTR SLVR 16FR STAT (SET/KITS/TRAYS/PACK) ×3 IMPLANT

## 2020-10-26 NOTE — Anesthesia Procedure Notes (Signed)
Procedure Name: Intubation Date/Time: 10/26/2020 10:27 PM Performed by: Lissa Morales, CRNA Pre-anesthesia Checklist: Patient identified, Emergency Drugs available, Suction available and Patient being monitored Patient Re-evaluated:Patient Re-evaluated prior to induction Oxygen Delivery Method: Circle system utilized Preoxygenation: Pre-oxygenation with 100% oxygen Induction Type: IV induction, Rapid sequence and Cricoid Pressure applied Laryngoscope Size: Mac and 4 Grade View: Grade I Tube type: Oral Number of attempts: 1 Airway Equipment and Method: Stylet and Oral airway Placement Confirmation: ETT inserted through vocal cords under direct vision, positive ETCO2 and breath sounds checked- equal and bilateral Secured at: 21 cm Tube secured with: Tape Dental Injury: Teeth and Oropharynx as per pre-operative assessment
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
## 2020-10-26 NOTE — ED Provider Notes (Signed)
Emergency Medicine Provider Triage Evaluation Note  Dawn Jackson , a 50 y.o. female  was evaluated in triage.  Pt complains of gradual onset, constant, diffuse, sharp, abdominal pain that began around 9 AM this morning. Pt also complains of nausea and a small episode of NBNB emesis. She went to her PCP today and was sent here with concern for appendicitis. No previous abdominal surgeries. Pt states the pain shoots into her chest at times and she feels like she is going to die. Last normal bowel movement earlier this morning. No fevers or chills.   Review of Systems  Positive: + abdominal pain, chest pain, nausea, vomiting Negative: - diarrhea, fevers, chills, SOB  Physical Exam  BP 121/76   Pulse (!) 56   Temp 98 F (36.7 C)   Resp 20   Ht 5\' 7"  (1.702 m)   Wt 56 kg   SpO2 100%   BMI 19.34 kg/m  Gen:   Awake, no distress   Resp:  Normal effort  MSK:   Moves extremities without difficulty  Other:  Diffuse abdominal TTP. No rebound or guarding.   Medical Decision Making  Medically screening exam initiated at 3:43 PM.  Appropriate orders placed.  Karry LAKYNN HALVORSEN was informed that the remainder of the evaluation will be completed by another provider, this initial triage assessment does not replace that evaluation, and the importance of remaining in the ED until their evaluation is complete.     Jule Ser, PA-C 10/26/20 1546    [place], 10/28/20, MD 10/26/20 1630

## 2020-10-26 NOTE — ED Triage Notes (Signed)
Generalized abd pain with nausea. Started in RLQ. PCP sent to r/o appendicitis.

## 2020-10-26 NOTE — Op Note (Signed)
Operative Note  Tiphany TASHIMA SCARPULLA  [id number]  [id number]  10/26/2020   Surgeon: Berna Bue MD   Procedure performed: Exploratory laparotomy, ileocecectomy   Preop diagnosis: Cecal volvulus Post-op diagnosis/intraop findings: Same, cecum ischemic/impending perforation   Specimens: Ileocecectomy Retained items: No EBL: 50cc Complications: none   Description of procedure: After obtaining informed consent the patient was taken to the operating room and placed supine on operating room table where general endotracheal anesthesia was initiated, preoperative antibiotics were administered, SCDs applied, and a formal timeout was performed.  Foley catheter was inserted.  The abdomen was prepped and draped in usual sterile fashion and a midline laparotomy was created.  The omentum and transverse colon, which is extremely redundant, were elevated to reveal the tensely dilated and ischemic cecum which had completely volvulized on its mesentery.  This was eviscerated and the volvulus reduced.  The lateral attachments of the ascending colon were released slightly in order to mobilize sufficient colon to create our anastomosis.  The ascending colon and distal ileum were then divided with the blue load 75 mm Endo GIA stapler.  The intervening mesentery was divided with the LigaSure and the specimen was handed off.  An ileocolic anastomosis was then created with a 75 mm blue load Endo GIA stapler.  The common enterotomy was closed with a TX 60 blue load.  A 3-0 silk suture was placed at the apex of the staple line.  Bleeding points on the TX 60 staple line were addressed with interrupted figure-of-eight 3 oh silks.  The staple line was also imbricated partially with seromuscular 3 oh silks.  The mesenteric defect was closed with 2-0 silks.  The anastomosis was confirmed to be widely patent, hemostatic and watertight.  This was reduced into the abdomen.  The peritoneal cavity was then irrigated with warm sterile  saline.  The transverse colon and omentum were brought back down over the small bowel.  The fascia was closed with running looped #1 PDS, starting on either end and tying centrally.  Of note she had a significant amount of preperitoneal fat and prominent veins within this tissue and there was some bleeding from the preperitoneal tissues which was addressed with cautery and LigaSure.  Once the fascia was closed, hemostasis was ensured within the subcutaneous tissues with cautery.  The skin was closed with a running subcuticular 4 Monocryl.  A honeycomb dressing was then applied. The patient was then awakened, extubated and taken to PACU in stable condition.    All counts were correct at the completion of the case.
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
## 2020-10-26 NOTE — Anesthesia Preprocedure Evaluation (Signed)
Anesthesia Evaluation  Patient identified by MRN, date of birth, ID band Patient awake    Reviewed: Allergy & Precautions, NPO status , Patient's Chart, lab work & pertinent test results  Airway Mallampati: II  TM Distance: >3 FB Neck ROM: Full    Dental no notable dental hx.    Pulmonary neg pulmonary ROS, former smoker,    Pulmonary exam normal breath sounds clear to auscultation       Cardiovascular negative cardio ROS Normal cardiovascular exam Rhythm:Regular Rate:Normal     Neuro/Psych Anxiety Depression negative neurological ROS     GI/Hepatic negative GI ROS, Neg liver ROS,   Endo/Other  negative endocrine ROS  Renal/GU negative Renal ROS  negative genitourinary   Musculoskeletal negative musculoskeletal ROS (+)   Abdominal   Peds negative pediatric ROS (+)  Hematology negative hematology ROS (+)   Anesthesia Other Findings   Reproductive/Obstetrics negative OB ROS                             Anesthesia Physical Anesthesia Plan  ASA: 2 and emergent  Anesthesia Plan: General   Post-op Pain Management:    Induction: Intravenous and Rapid sequence  PONV Risk Score and Plan: 3 and Ondansetron, Dexamethasone, Treatment may vary due to age or medical condition and Midazolam  Airway Management Planned: Oral ETT  Additional Equipment:   Intra-op Plan:   Post-operative Plan: Extubation in OR  Informed Consent: I have reviewed the patients History and Physical, chart, labs and discussed the procedure including the risks, benefits and alternatives for the proposed anesthesia with the patient or authorized representative who has indicated his/her understanding and acceptance.     Dental advisory given  Plan Discussed with: CRNA and Surgeon  Anesthesia Plan Comments:         Anesthesia Quick Evaluation

## 2020-10-26 NOTE — ED Notes (Signed)
Pt to OR.

## 2020-10-26 NOTE — ED Notes (Signed)
Pt medicated per MAR. Pt is screaming in pain and states there has been no improvement. Pt to CT via stretcher

## 2020-10-27 ENCOUNTER — Encounter (HOSPITAL_COMMUNITY): Payer: Self-pay | Admitting: Surgery

## 2020-10-27 LAB — CBC
HCT: 38.5 % (ref 36.0–46.0)
Hemoglobin: 13.1 g/dL (ref 12.0–15.0)
MCH: 31.7 pg (ref 26.0–34.0)
MCHC: 34 g/dL (ref 30.0–36.0)
MCV: 93.2 fL (ref 80.0–100.0)
Platelets: 214 10*3/uL (ref 150–400)
RBC: 4.13 MIL/uL (ref 3.87–5.11)
RDW: 13.4 % (ref 11.5–15.5)
WBC: 16.7 10*3/uL — ABNORMAL HIGH (ref 4.0–10.5)
nRBC: 0 % (ref 0.0–0.2)

## 2020-10-27 LAB — BASIC METABOLIC PANEL
Anion gap: 6 (ref 5–15)
BUN: 10 mg/dL (ref 6–20)
CO2: 23 mmol/L (ref 22–32)
Calcium: 8.5 mg/dL — ABNORMAL LOW (ref 8.9–10.3)
Chloride: 108 mmol/L (ref 98–111)
Creatinine, Ser: 0.58 mg/dL (ref 0.44–1.00)
GFR, Estimated: >60 mL/min (ref >60)
Glucose, Bld: 158 mg/dL — ABNORMAL HIGH (ref 70–99)
Potassium: 4.1 mmol/L (ref 3.5–5.1)
Sodium: 137 mmol/L (ref 135–145)

## 2020-10-27 LAB — MAGNESIUM: Magnesium: 1.8 mg/dL (ref 1.7–2.4)

## 2020-10-27 MED ORDER — DIPHENHYDRAMINE HCL 50 MG/ML IJ SOLN: 25.0000 mg | Freq: Four times a day (QID) | INTRAMUSCULAR | Status: DC | PRN

## 2020-10-27 MED ORDER — METOPROLOL TARTRATE 5 MG/5ML IV SOLN: 5.0000 mg | Freq: Four times a day (QID) | INTRAVENOUS | Status: DC | PRN

## 2020-10-27 MED ORDER — PROCHLORPERAZINE EDISYLATE 10 MG/2ML IJ SOLN: 5.0000 mg | Freq: Four times a day (QID) | INTRAMUSCULAR | Status: DC | PRN

## 2020-10-27 MED ORDER — METHOCARBAMOL 1000 MG/10ML IJ SOLN
500.0000 mg | Freq: Three times a day (TID) | INTRAVENOUS | Status: DC
  Administered 2020-10-27: 500 mg via INTRAVENOUS
  Filled 2020-10-27: qty 5
  Filled 2020-10-27: qty 500

## 2020-10-27 MED ORDER — HYDROXYZINE HCL 25 MG PO TABS
50.0000 mg | ORAL_TABLET | Freq: Three times a day (TID) | ORAL | Status: DC | PRN
  Administered 2020-10-27: 50 mg via ORAL
  Filled 2020-10-27: qty 2

## 2020-10-27 MED ORDER — KETOROLAC TROMETHAMINE 30 MG/ML IJ SOLN
30.0000 mg | Freq: Three times a day (TID) | INTRAMUSCULAR | Status: AC
  Administered 2020-10-27 – 2020-10-31 (×15): 30 mg via INTRAVENOUS
  Filled 2020-10-27 (×14): qty 1

## 2020-10-27 MED ORDER — GABAPENTIN 300 MG PO CAPS
300.0000 mg | ORAL_CAPSULE | Freq: Two times a day (BID) | ORAL | Status: DC
  Administered 2020-10-27 – 2020-11-02 (×14): 300 mg via ORAL
  Filled 2020-10-27 (×13): qty 1

## 2020-10-27 MED ORDER — PROCHLORPERAZINE MALEATE 10 MG PO TABS
10.0000 mg | ORAL_TABLET | Freq: Four times a day (QID) | ORAL | Status: DC | PRN
  Filled 2020-10-27: qty 1

## 2020-10-27 MED ORDER — SIMETHICONE 80 MG PO CHEW
40.0000 mg | CHEWABLE_TABLET | Freq: Four times a day (QID) | ORAL | Status: DC | PRN
  Administered 2020-10-27 – 2020-10-29 (×2): 40 mg via ORAL
  Filled 2020-10-27 (×2): qty 1

## 2020-10-27 MED ORDER — OXYCODONE HCL 5 MG PO TABS
5.0000 mg | ORAL_TABLET | ORAL | Status: DC | PRN
  Administered 2020-10-27 – 2020-11-02 (×24): 10 mg via ORAL
  Filled 2020-10-27 (×25): qty 2

## 2020-10-27 MED ORDER — HYDROMORPHONE HCL 1 MG/ML IJ SOLN
INTRAMUSCULAR | Status: DC | PRN
  Administered 2020-10-27: 1 mg via INTRAVENOUS

## 2020-10-27 MED ORDER — HYDROMORPHONE HCL 1 MG/ML IJ SOLN
INTRAMUSCULAR | Status: AC
  Filled 2020-10-27: qty 1

## 2020-10-27 MED ORDER — MAGNESIUM SULFATE 2 GM/50ML IV SOLN
2.0000 g | Freq: Once | INTRAVENOUS | Status: AC
  Administered 2020-10-27: 2 g via INTRAVENOUS
  Filled 2020-10-27: qty 50

## 2020-10-27 MED ORDER — ONDANSETRON 4 MG PO TBDP: 4.0000 mg | ORAL_TABLET | Freq: Four times a day (QID) | ORAL | Status: DC | PRN

## 2020-10-27 MED ORDER — SODIUM CHLORIDE 0.9 % IV SOLN
2.0000 g | Freq: Three times a day (TID) | INTRAVENOUS | Status: AC
  Administered 2020-10-27 (×2): 2 g via INTRAVENOUS
  Filled 2020-10-27 (×2): qty 2

## 2020-10-27 MED ORDER — HYDROMORPHONE HCL 1 MG/ML IJ SOLN: 0.2500 mg | INTRAMUSCULAR | Status: DC | PRN

## 2020-10-27 MED ORDER — ONDANSETRON HCL 4 MG/2ML IJ SOLN: 4.0000 mg | Freq: Four times a day (QID) | INTRAMUSCULAR | Status: DC | PRN

## 2020-10-27 MED ORDER — DULOXETINE HCL 60 MG PO CPEP
60.0000 mg | ORAL_CAPSULE | Freq: Two times a day (BID) | ORAL | Status: DC
  Administered 2020-10-27 – 2020-11-02 (×7): 60 mg via ORAL
  Filled 2020-10-27 (×12): qty 1

## 2020-10-27 MED ORDER — BISACODYL 10 MG RE SUPP
10.0000 mg | Freq: Every day | RECTAL | Status: DC | PRN
Start: 2020-10-27 — End: 2020-11-02

## 2020-10-27 MED ORDER — DIPHENHYDRAMINE HCL 25 MG PO CAPS: 25.0000 mg | ORAL_CAPSULE | Freq: Four times a day (QID) | ORAL | Status: DC | PRN

## 2020-10-27 MED ORDER — PROMETHAZINE HCL 25 MG/ML IJ SOLN: 6.2500 mg | INTRAMUSCULAR | Status: DC | PRN

## 2020-10-27 MED ORDER — METHOCARBAMOL 1000 MG/10ML IJ SOLN
500.0000 mg | Freq: Four times a day (QID) | INTRAVENOUS | Status: DC | PRN
  Filled 2020-10-27 (×2): qty 5

## 2020-10-27 MED ORDER — TRAMADOL HCL 50 MG PO TABS
50.0000 mg | ORAL_TABLET | Freq: Four times a day (QID) | ORAL | Status: DC | PRN
Start: 2020-10-27 — End: 2020-11-02
  Administered 2020-10-31 – 2020-11-01 (×2): 50 mg via ORAL
  Filled 2020-10-27 (×2): qty 1

## 2020-10-27 MED ORDER — HYDRALAZINE HCL 20 MG/ML IJ SOLN: 10.0000 mg | INTRAMUSCULAR | Status: DC | PRN

## 2020-10-27 MED ORDER — KETOROLAC TROMETHAMINE 30 MG/ML IJ SOLN: 30.0000 mg | Freq: Three times a day (TID) | INTRAMUSCULAR | Status: DC

## 2020-10-27 MED ORDER — DOCUSATE SODIUM 100 MG PO CAPS
100.0000 mg | ORAL_CAPSULE | Freq: Two times a day (BID) | ORAL | Status: DC
  Administered 2020-10-27 – 2020-11-02 (×14): 100 mg via ORAL
  Filled 2020-10-27 (×13): qty 1

## 2020-10-27 MED ORDER — HYDROMORPHONE HCL 1 MG/ML IJ SOLN
0.5000 mg | INTRAMUSCULAR | Status: DC | PRN
  Administered 2020-10-27 (×2): 1 mg via INTRAVENOUS
  Administered 2020-10-27: 0.5 mg via INTRAVENOUS
  Administered 2020-10-27 (×2): 1 mg via INTRAVENOUS
  Administered 2020-10-27: 0.5 mg via INTRAVENOUS
  Administered 2020-10-27 – 2020-10-28 (×5): 1 mg via INTRAVENOUS
  Administered 2020-10-28: 0.5 mg via INTRAVENOUS
  Administered 2020-10-28 – 2020-10-30 (×10): 1 mg via INTRAVENOUS
  Filled 2020-10-27 (×23): qty 1

## 2020-10-27 MED ORDER — KETOROLAC TROMETHAMINE 30 MG/ML IJ SOLN
30.0000 mg | Freq: Once | INTRAMUSCULAR | Status: DC | PRN
Start: 2020-10-27 — End: 2020-10-27

## 2020-10-27 MED ORDER — SODIUM CHLORIDE 0.9 % IV SOLN: INTRAVENOUS | Status: DC

## 2020-10-27 MED ORDER — CHLORHEXIDINE GLUCONATE CLOTH 2 % EX PADS
6.0000 | MEDICATED_PAD | Freq: Every day | CUTANEOUS | Status: DC
  Administered 2020-10-28 – 2020-11-02 (×6): 6 via TOPICAL

## 2020-10-27 MED ORDER — BOOST / RESOURCE BREEZE PO LIQD CUSTOM
1.0000 | Freq: Three times a day (TID) | ORAL | Status: DC
  Administered 2020-10-27 – 2020-10-28 (×4): 1 via ORAL

## 2020-10-27 MED ORDER — METHOCARBAMOL 1000 MG/10ML IJ SOLN
500.0000 mg | Freq: Three times a day (TID) | INTRAVENOUS | Status: DC
  Administered 2020-10-27 – 2020-10-31 (×11): 500 mg via INTRAVENOUS
  Filled 2020-10-27 (×9): qty 500

## 2020-10-27 MED ORDER — ACETAMINOPHEN 500 MG PO TABS
1000.0000 mg | ORAL_TABLET | Freq: Four times a day (QID) | ORAL | Status: DC
  Administered 2020-10-27 – 2020-11-02 (×22): 1000 mg via ORAL
  Filled 2020-10-27 (×24): qty 2

## 2020-10-27 MED ORDER — ENOXAPARIN SODIUM 40 MG/0.4ML IJ SOSY
40.0000 mg | PREFILLED_SYRINGE | INTRAMUSCULAR | Status: DC
  Administered 2020-10-27 – 2020-11-01 (×6): 40 mg via SUBCUTANEOUS
  Filled 2020-10-27 (×6): qty 0.4

## 2020-10-27 NOTE — Progress Notes (Signed)
Progress Note  1 Day Post-Op  Subjective: Patient reports cramping gas pains and discomfort from foley. Pt reports she will get up and ambulate this AM but not passing flatus yet. Denies nausea. Pt reports hx of cocaine abuse.   Objective: Vital signs in last 24 hours: Temp:  [97.3 F (36.3 C)-98.8 F (37.1 C)] 98.1 F (36.7 C) (07/14 0926) Pulse Rate:  [49-71] 69 (07/14 0926) Resp:  [11-20] 16 (07/14 0926) BP: (87-134)/(56-97) 91/56 (07/14 0926) SpO2:  [97 %-100 %] 100 % (07/14 0926) Weight:  [56 kg] 56 kg (07/13 1539)    Intake/Output from previous day: 07/13 0701 - 07/14 0700 In: 2387.2 [P.O.:120; I.V.:2167.2; IV Piggyback:100] Out: 1300 [Urine:1200; Blood:100] Intake/Output this shift: Total I/O In: 60 [P.O.:60] Out: 200 [Urine:200]  PE: General: pleasant, WD, WN female who is laying in bed in NAD Heart: regular, rate, and rhythm.  Normal s1,s2. No obvious murmurs, gallops, or rubs noted.  Palpable radial and pedal pulses bilaterally Lungs: CTAB, no wheezes, rhonchi, or rales noted.  Respiratory effort nonlabored Abd: soft, appropriately ttp, ND, +BS, honeycomb dressing to midline wound with small amount bloody drainage MS: all 4 extremities are symmetrical with no cyanosis, clubbing, or edema. Skin: warm and dry with no masses, lesions, or rashes Neuro: Cranial nerves 2-12 grossly intact, sensation is normal throughout Psych: A&Ox3 with an appropriate affect.    Lab Results:  Recent Labs    10/26/20 1615 10/27/20 0359  WBC 10.4 16.7*  HGB 13.9 13.1  HCT 41.1 38.5  PLT 233 214   BMET Recent Labs    10/26/20 1615 10/27/20 0359  NA 137 137  K 3.8 4.1  CL 103 108  CO2 26 23  GLUCOSE 140* 158*  BUN 12 10  CREATININE 0.70 0.58  CALCIUM 9.5 8.5*   PT/INR No results for input(s): LABPROT, INR in the last 72 hours. CMP     Component Value Date/Time   NA 137 10/27/2020 0359   NA 143 10/12/2019 0856   K 4.1 10/27/2020 0359   CL 108 10/27/2020 0359    CO2 23 10/27/2020 0359   GLUCOSE 158 (H) 10/27/2020 0359   BUN 10 10/27/2020 0359   BUN 10 10/12/2019 0856   CREATININE 0.58 10/27/2020 0359   CALCIUM 8.5 (L) 10/27/2020 0359   PROT 6.9 10/26/2020 1615   PROT 6.8 10/12/2019 0856   ALBUMIN 4.4 10/26/2020 1615   ALBUMIN 4.6 10/12/2019 0856   AST 16 10/26/2020 1615   ALT 9 10/26/2020 1615   ALKPHOS 43 10/26/2020 1615   BILITOT 1.1 10/26/2020 1615   BILITOT 0.3 10/12/2019 0856   GFRNONAA >60 10/27/2020 0359   GFRAA 104 10/12/2019 0856   Lipase     Component Value Date/Time   LIPASE 29 10/26/2020 1615       Studies/Results: CT ABDOMEN PELVIS W CONTRAST  Addendum Date: 10/26/2020   ADDENDUM REPORT: 10/26/2020 21:08 ADDENDUM: Upon re-review of CT abdomen and pelvis performed October 26, 2020, the following findings are noted: There is an error in findings and impression section of the report regarding the volvulus loop of bowel. 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 Electronically Signed   By: Maudry Mayhew MD   On: 10/26/2020 21:08   Result Date: 10/26/2020 CLINICAL DATA:  Abdominal pain, bowel obstruction suspected. EXAM: CT ABDOMEN AND PELVIS WITH CONTRAST TECHNIQUE: Multidetector CT imaging of the abdomen and  pelvis was performed using the standard protocol following bolus administration of intravenous contrast. CONTRAST:  68mL OMNIPAQUE IOHEXOL 350 MG/ML SOLN COMPARISON:  None. FINDINGS: Lower chest: No acute abnormality. Hepatobiliary: Diffuse hepatic steatosis. Gallbladder is unremarkable. No biliary ductal dilation. Pancreas: Prominence of the pancreatic duct measuring 3.5 mm in the head of the pancreas and 3 mm in the body, without discrete obstructing lesion visualized. No evidence of pancreatic parenchymal atrophy. Spleen: Within normal limits. Adrenals/Urinary Tract: Adrenal glands are unremarkable. Kidneys are  normal, without renal calculi, solid enhancing lesion, or hydronephrosis. Bladder is unremarkable. 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 There is adjacent inflammatory stranding without significant wall thickening or hypoenhancement. Stomach is grossly unremarkable. No pathologic dilation of small bowel. Large volume of stool throughout the remaining colon. Vascular/Lymphatic: No abdominal aortic aneurysm. No pathologically enlarged abdominal or pelvic lymph nodes. Reproductive: Intrauterine device which appears to be in appropriate position. Simple appearing 2.3 cm right ovarian cyst. No follow-up imaging recommended. Note: This recommendation does not apply to premenarchal patients and to those with increased risk (genetic, family history, elevated tumor markers or other high-risk factors) of ovarian cancer. Reference: JACR 2020 Feb; 17(2):248-254 Other: Right lower quadrant mesenteric edema. No significant abdominopelvic ascites. No pneumoperitoneum. Musculoskeletal: L5-S1 discogenic disease. No acute osseous abnormality. IMPRESSION: 1. 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 No evidence of perforation. 2. Large volume of stool throughout the remaining colon. 3. Diffuse hepatic steatosis. 4. Prominence of the pancreatic duct measuring up to 3.5 mm in the head of the pancreas and 3 mm in the body, without discrete obstructing lesion visualized. Consider nonemergent MRCP with and without contrast for further evaluation. Electronically Signed: By: Maudry Mayhew MD On: 10/26/2020 19:56    Anti-infectives: Anti-infectives (From admission, onward)    Start     Dose/Rate Route Frequency Ordered Stop   10/27/20 0600  ceFAZolin (ANCEF) 2 g in sodium chloride 0.9 % 100  mL IVPB        2 g 200 mL/hr over 30 Minutes Intravenous Every 8 hours 10/27/20 0106 10/27/20 2159   10/26/20 2215  ceFAZolin (ANCEF) IVPB 1 g/50 mL premix  Status:  Discontinued        1 g 100 mL/hr over 30 Minutes Intravenous  Once 10/26/20 2205 10/27/20 0106   10/26/20 2115  ceFAZolin (ANCEF) IVPB 1 g/50 mL premix        1 g 100 mL/hr over 30 Minutes Intravenous On call to O.R. 10/26/20 2104 10/26/20 2221        Assessment/Plan Cecal volvulus  POD1 S/p exploratory laparotomy, ileocecectomy 10/26/20 Dr. Fredricka Bonine - ok to have CLD but would not advance past this today - working on pain control, pt reports hx of cocaine abuse - remove foley today  - mobilize today, PT/OT  FEN: CLD, decrease IVF to 75 cc/h VTE: LMWH ID: Ancef periop  LOS: 1 day    Juliet Rude, [facility] Surgery 10/27/2020, 11:13 AM Please see Amion for pager number during day hours 7:00am-4:30pm

## 2020-10-27 NOTE — Plan of Care (Signed)
  Problem: Education: Goal: Knowledge of General Education information will improve Description: Including pain rating scale, medication(s)/side effects and non-pharmacologic comfort measures 10/27/2020 0258 by Quincy Sheehan, RN Outcome: Progressing 10/27/2020 0108 by Quincy Sheehan, RN Outcome: Progressing   Problem: Health Behavior/Discharge Planning: Goal: Ability to manage health-related needs will improve 10/27/2020 0258 by Quincy Sheehan, RN Outcome: Progressing 10/27/2020 0108 by Quincy Sheehan, RN Outcome: Progressing   Problem: Clinical Measurements: Goal: Ability to maintain clinical measurements within normal limits will improve 10/27/2020 0258 by Quincy Sheehan, RN Outcome: Progressing 10/27/2020 0108 by Quincy Sheehan, RN Outcome: Progressing Goal: Will remain free from infection 10/27/2020 0258 by Quincy Sheehan, RN Outcome: Progressing 10/27/2020 0108 by Quincy Sheehan, RN Outcome: Progressing Goal: Diagnostic test results will improve 10/27/2020 0258 by Quincy Sheehan, RN Outcome: Progressing 10/27/2020 0108 by Quincy Sheehan, RN Outcome: Progressing Goal: Respiratory complications will improve 10/27/2020 0258 by Quincy Sheehan, RN Outcome: Progressing 10/27/2020 0108 by Quincy Sheehan, RN Outcome: Progressing Goal: Cardiovascular complication will be avoided 10/27/2020 0258 by Quincy Sheehan, RN Outcome: Progressing 10/27/2020 0108 by Quincy Sheehan, RN Outcome: Progressing   Problem: Activity: Goal: Risk for activity intolerance will decrease 10/27/2020 0258 by Quincy Sheehan, RN Outcome: Progressing 10/27/2020 0108 by Quincy Sheehan, RN Outcome: Progressing   Problem: Nutrition: Goal: Adequate nutrition will be maintained 10/27/2020 0258 by Quincy Sheehan, RN Outcome: Progressing 10/27/2020 0108 by Quincy Sheehan, RN Outcome: Progressing   Problem: Coping: Goal: Level of anxiety will  decrease 10/27/2020 0258 by Quincy Sheehan, RN Outcome: Progressing 10/27/2020 0108 by Quincy Sheehan, RN Outcome: Progressing   Problem: Elimination: Goal: Will not experience complications related to bowel motility 10/27/2020 0258 by Quincy Sheehan, RN Outcome: Progressing 10/27/2020 0108 by Quincy Sheehan, RN Outcome: Progressing Goal: Will not experience complications related to urinary retention 10/27/2020 0258 by Quincy Sheehan, RN Outcome: Progressing 10/27/2020 0108 by Quincy Sheehan, RN Outcome: Progressing   Problem: Pain Managment: Goal: General experience of comfort will improve 10/27/2020 0258 by Quincy Sheehan, RN Outcome: Progressing 10/27/2020 0108 by Quincy Sheehan, RN Outcome: Progressing   Problem: Safety: Goal: Ability to remain free from injury will improve 10/27/2020 0258 by Quincy Sheehan, RN Outcome: Progressing 10/27/2020 0108 by Quincy Sheehan, RN Outcome: Progressing   Problem: Skin Integrity: Goal: Risk for impaired skin integrity will decrease 10/27/2020 0258 by Quincy Sheehan, RN Outcome: Progressing 10/27/2020 0108 by Quincy Sheehan, RN Outcome: Progressing

## 2020-10-27 NOTE — Evaluation (Signed)
Physical Therapy Evaluation Patient Details Name: Dawn Jackson MRN: [id number] DOB: [birth year] Today's Date: 10/27/2020   History of Present Illness  50 y.o. female admitted 10/26/20 for cecal volvulus, s/p exploratory laparotomy with iliocecectomy 10/26/20.  Clinical Impression  Pt admitted with above diagnosis. Pt ambulated 20' with RW, distance limited by 8.5/10 pain. Instructed pt in log roll technique for bed mobility. Good progress expected once pain is managed. Benefits of mobility were explained to pt. She puts forth good effort.  Pt currently with functional limitations due to the deficits listed below (see PT Problem List). Pt will benefit from skilled PT to increase their independence and safety with mobility to allow discharge to the venue listed below.       Follow Up Recommendations No PT follow up    Equipment Recommendations  Rolling walker with 5" wheels;3in1 (PT)    Recommendations for Other Services       Precautions / Restrictions Precautions Precautions: Other (comment) Precaution Comments: abdominal surgery Restrictions Weight Bearing Restrictions: No      Mobility  Bed Mobility Overal bed mobility: Needs Assistance Bed Mobility: Rolling;Sidelying to Sit Rolling: Min guard Sidelying to sit: Min assist       General bed mobility comments: VCs for log roll technique, min A to raise trunk    Transfers Overall transfer level: Needs assistance Equipment used: Rolling walker (2 wheeled) Transfers: Pharmacologist;Sit to/from Stand Sit to Stand: Min guard;From elevated surface Stand pivot transfers: Min guard       General transfer comment: VCs hand placement  Ambulation/Gait Ambulation/Gait assistance: Min guard Gait Distance (Feet): 20 Feet Assistive device: Rolling walker (2 wheeled) Gait Pattern/deviations: Step-through pattern;Decreased stride length Gait velocity: decr   General Gait Details: increased time, distance limited by pain,  VCs for relaxation breathing  Stairs            Wheelchair Mobility    Modified Rankin (Stroke Patients Only)       Balance Overall balance assessment: Modified Independent                                           Pertinent Vitals/Pain Pain Assessment: 0-10 Pain Score: 9  Pain Location: abdomen Pain Descriptors / Indicators: Operative site guarding;Guarding;Grimacing Pain Intervention(s): Limited activity within patient's tolerance;Monitored during session;Premedicated before session;Patient requesting pain meds-RN notified;Repositioned    Home Living Family/patient expects to be discharged to:: Private residence Living Arrangements: Spouse/significant other Available Help at Discharge: Family   Home Access: Stairs to enter   Entrance Stairs-Number of Steps: 3 Home Layout: Two level Home Equipment: Environmental consultant - 2 wheels;Bedside commode      Prior Function Level of Independence: Independent               Hand Dominance        Extremity/Trunk Assessment   Upper Extremity Assessment Upper Extremity Assessment: Overall WFL for tasks assessed    Lower Extremity Assessment Lower Extremity Assessment: Overall WFL for tasks assessed    Cervical / Trunk Assessment Cervical / Trunk Assessment: Normal  Communication   Communication: No difficulties  Cognition Arousal/Alertness: Awake/alert Behavior During Therapy: WFL for tasks assessed/performed Overall Cognitive Status: Within Functional Limits for tasks assessed  General Comments      Exercises     Assessment/Plan    PT Assessment Patient needs continued PT services  PT Problem List Decreased mobility;Decreased knowledge of use of DME;Decreased activity tolerance       PT Treatment Interventions      PT Goals (Current goals can be found in the Care Plan section)  Acute Rehab PT Goals Patient Stated Goal: return to  work, be able to fly in late Aug for planned trip PT Goal Formulation: With patient Time For Goal Achievement: 11/10/20 Potential to Achieve Goals: Good    Frequency Min 3X/week   Barriers to discharge        Co-evaluation               AM-PAC PT "6 Clicks" Mobility  Outcome Measure Help needed turning from your back to your side while in a flat bed without using bedrails?: None Help needed moving from lying on your back to sitting on the side of a flat bed without using bedrails?: A Little Help needed moving to and from a bed to a chair (including a wheelchair)?: A Little Help needed standing up from a chair using your arms (e.g., wheelchair or bedside chair)?: A Little Help needed to walk in hospital room?: A Little Help needed climbing 3-5 steps with a railing? : A Little 6 Click Score: 19    End of Session   Activity Tolerance: Patient limited by pain Patient left: in bed;with call bell/phone within reach;with bed alarm set;with SCD's reapplied Nurse Communication: Mobility status;Patient requests pain meds PT Visit Diagnosis: Difficulty in walking, not elsewhere classified (R26.2);Pain    Time: 5102-5852 PT Time Calculation (min) (ACUTE ONLY): 40 min   Charges:   PT Evaluation $PT Eval Moderate Complexity: 1 Mod PT Treatments $Gait Training: 8-22 mins $Therapeutic Activity: 8-22 mins       Ralene Bathe Kistler PT 10/27/2020  Acute Rehabilitation Services Pager [phone] Office [phone]

## 2020-10-27 NOTE — Transfer of Care (Signed)
Immediate Anesthesia Transfer of Care Note  Patient: Dawn Jackson  Procedure(s) Performed: EXPLORATORY LAPAROTOMY (Abdomen) PARTIAL COLECTOMY, ILEOCECECTOMY (Right: Abdomen)  Patient Location: PACU  Anesthesia Type:General  Level of Consciousness: awake, alert , oriented and patient cooperative  Airway & Oxygen Therapy: Patient Spontanous Breathing and Patient connected to face mask oxygen  Post-op Assessment: Report given to RN, Post -op Vital signs reviewed and stable and Patient moving all extremities X 4  Post vital signs: stable  Last Vitals:  Vitals Value Taken Time  BP 87/60 10/27/20 0015  Temp 36.4 C 10/27/20 0010  Pulse 62 10/27/20 0021  Resp 12 10/27/20 0021  SpO2 100 % 10/27/20 0021  Vitals shown include unvalidated device data.  Last Pain:  Vitals:   10/27/20 0010  TempSrc:   PainSc: 8       Patients Stated Pain Goal: 4 (10/27/20 0010)  Complications: No notable events documented.

## 2020-10-27 NOTE — Progress Notes (Signed)
Increased pain issues this afternoon. Taking IV dilaudid with slight improvement. On tylenol and robaxin for multimodal therapy already. -add toradol 30 mg IV q8h

## 2020-10-27 NOTE — Progress Notes (Signed)
Order received via phone from Dr Harlon Flor to [facility] cath. Bladder scan 376 ml.

## 2020-10-27 NOTE — Progress Notes (Signed)
Patient arrives to floor from PACU with Amy RN. Bedside report given.  Patient acclimated to environment. Call light and phone in reach. Bed exit alarm set. Rounds continued per MD orders and unit protocol.

## 2020-10-27 NOTE — Plan of Care (Signed)

## 2020-10-28 LAB — BASIC METABOLIC PANEL
Anion gap: 6 (ref 5–15)
BUN: 7 mg/dL (ref 6–20)
CO2: 25 mmol/L (ref 22–32)
Calcium: 8.4 mg/dL — ABNORMAL LOW (ref 8.9–10.3)
Chloride: 104 mmol/L (ref 98–111)
Creatinine, Ser: 0.54 mg/dL (ref 0.44–1.00)
GFR, Estimated: >60 mL/min (ref >60)
Glucose, Bld: 146 mg/dL — ABNORMAL HIGH (ref 70–99)
Potassium: 4.1 mmol/L (ref 3.5–5.1)
Sodium: 135 mmol/L (ref 135–145)

## 2020-10-28 LAB — CBC
HCT: 37.2 % (ref 36.0–46.0)
Hemoglobin: 12.4 g/dL (ref 12.0–15.0)
MCH: 32 pg (ref 26.0–34.0)
MCHC: 33.3 g/dL (ref 30.0–36.0)
MCV: 96.1 fL (ref 80.0–100.0)
Platelets: 193 10*3/uL (ref 150–400)
RBC: 3.87 MIL/uL (ref 3.87–5.11)
RDW: 13.8 % (ref 11.5–15.5)
WBC: 15.4 10*3/uL — ABNORMAL HIGH (ref 4.0–10.5)
nRBC: 0 % (ref 0.0–0.2)

## 2020-10-28 LAB — SURGICAL PATHOLOGY

## 2020-10-28 MED ORDER — TAMSULOSIN HCL 0.4 MG PO CAPS
0.4000 mg | ORAL_CAPSULE | Freq: Every day | ORAL | Status: DC
  Administered 2020-10-28 – 2020-11-01 (×5): 0.4 mg via ORAL
  Filled 2020-10-28 (×4): qty 1

## 2020-10-28 MED ORDER — ENSURE ENLIVE PO LIQD
237.0000 mL | Freq: Two times a day (BID) | ORAL | Status: DC
  Administered 2020-10-29 – 2020-11-02 (×4): 237 mL via ORAL

## 2020-10-28 MED ORDER — BETHANECHOL CHLORIDE 10 MG PO TABS
10.0000 mg | ORAL_TABLET | Freq: Three times a day (TID) | ORAL | Status: DC
  Administered 2020-10-28 – 2020-11-01 (×12): 10 mg via ORAL
  Filled 2020-10-28 (×13): qty 1

## 2020-10-28 NOTE — Progress Notes (Signed)
Physical Therapy Treatment Patient Details Name: Dawn Jackson MRN: [id number] DOB: July 23, 1970 Today's Date: 10/28/2020    History of Present Illness 50 y.o. female admitted 10/26/20 for cecal volvulus, s/p exploratory laparotomy with iliocecectomy 10/26/20.    PT Comments    Patient making improvement with mobility but remains limited by abdominal pain. Pt continues to require min guard/assist for gait with bil UE support on IV pole today. Patient with 1 slight LOB and assist provided to steady. Acute PT will continue to progress pt as able, anticipate she will have no needs for follow up PT at home and pending progress may no longer require equipment. Will continue to assess during stay.    Follow Up Recommendations  No PT follow up     Equipment Recommendations  Rolling walker with 5" wheels;3in1 (PT) (TBA)    Recommendations for Other Services       Precautions / Restrictions Precautions Precautions: Other (comment) Precaution Comments: abdominal surgery Restrictions Weight Bearing Restrictions: No    Mobility  Bed Mobility Overal bed mobility: Needs Assistance Bed Mobility: Rolling;Sidelying to Sit Rolling: Supervision Sidelying to sit: Min guard       General bed mobility comments: pt OOB in recliner    Transfers Overall transfer level: Needs assistance Equipment used: Rolling walker (2 wheeled) Transfers: Sit to/from Stand Sit to Stand: Min guard         General transfer comment: min guard for safety, pt slow to initiate due to abdominal pain  Ambulation/Gait Ambulation/Gait assistance: Min guard;Min assist Gait Distance (Feet): 340 Feet Assistive device: IV Pole Gait Pattern/deviations: Step-through pattern;Decreased stride length;Trunk flexed Gait velocity: decr   General Gait Details: pt with slightly flexed trunk intermittently. pt using bil UE on IV pole to steady and assist required 1x to steady. intermittent assist to manage IV pole in  hallway.   Stairs             Wheelchair Mobility    Modified Rankin (Stroke Patients Only)       Balance Overall balance assessment: Needs assistance Sitting-balance support: Feet supported Sitting balance-Leahy Scale: Good     Standing balance support: Single extremity supported Standing balance-Leahy Scale: Poor Standing balance comment: reliant on UE support at sink side 2* pain                            Cognition Arousal/Alertness: Awake/alert Behavior During Therapy: WFL for tasks assessed/performed Overall Cognitive Status: Within Functional Limits for tasks assessed                                        Exercises      General Comments        Pertinent Vitals/Pain Pain Assessment: Faces Faces Pain Scale: Hurts even more Pain Location: abdomen Pain Descriptors / Indicators: Operative site guarding;Guarding;Grimacing Pain Intervention(s): Limited activity within patient's tolerance;Repositioned;Monitored during session    Home Living Family/patient expects to be discharged to:: Private residence Living Arrangements: Spouse/significant other Available Help at Discharge: Family Type of Home: House Home Access: Stairs to enter   Home Layout: Two level Home Equipment: None      Prior Function Level of Independence: Independent          PT Goals (current goals can now be found in the care plan section) Acute Rehab PT Goals Patient Stated Goal: return to  work, be able to fly in late Aug for planned trip PT Goal Formulation: With patient Time For Goal Achievement: 11/10/20 Potential to Achieve Goals: Good Progress towards PT goals: Progressing toward goals    Frequency    Min 3X/week      PT Plan Current plan remains appropriate    Co-evaluation              AM-PAC PT "6 Clicks" Mobility   Outcome Measure  Help needed turning from your back to your side while in a flat bed without using bedrails?:  A Little Help needed moving from lying on your back to sitting on the side of a flat bed without using bedrails?: A Little Help needed moving to and from a bed to a chair (including a wheelchair)?: A Little Help needed standing up from a chair using your arms (e.g., wheelchair or bedside chair)?: A Little Help needed to walk in hospital room?: A Little Help needed climbing 3-5 steps with a railing? : A Little 6 Click Score: 18    End of Session Equipment Utilized During Treatment: Gait belt Activity Tolerance: Patient tolerated treatment well Patient left: in chair;with call bell/phone within reach Nurse Communication: Mobility status PT Visit Diagnosis: Difficulty in walking, not elsewhere classified (R26.2);Pain     Time: 8527-7824 PT Time Calculation (min) (ACUTE ONLY): 18 min  Charges:  $Gait Training: 8-22 mins                     Wynn Maudlin, DPT Acute Rehabilitation Services Office [phone] Pager [phone]    Anitra Lauth 10/28/2020, 1:02 PM

## 2020-10-28 NOTE — Progress Notes (Addendum)
Patient has had no urinary output since I&O cath at 1800. noted via bladder scan. On call notified (Tseui), stated to place a foley.

## 2020-10-28 NOTE — Progress Notes (Signed)
16 Fr. Foley inserted, tolerated well.

## 2020-10-28 NOTE — Progress Notes (Signed)
Progress Note  2 Days Post-Op  Subjective: Patient reports dilaudid helps with pain control. Passing small amount flatus but still bloated. Denies nausea. Reports she is hungry. Frustrated with not being able to eat. Foley reinserted overnight for urinary retention.   Objective: Vital signs in last 24 hours: Temp:  [97.7 F (36.5 C)-98.8 F (37.1 C)] 98.3 F (36.8 C) (07/15 0521) Pulse Rate:  [64-70] 68 (07/15 0521) Resp:  [16-18] 17 (07/15 0521) BP: (95-110)/(62-81) 110/81 (07/15 0521) SpO2:  [97 %-100 %] 97 % (07/15 0521) Last BM Date: 10/26/20  Intake/Output from previous day: 07/14 0701 - 07/15 0700 In: 2038.4 [P.O.:180; I.V.:1708.4; IV Piggyback:150] Out: 800 [Urine:800] Intake/Output this shift: Total I/O In: 240 [P.O.:240] Out: 400 [Urine:400]  PE: General: pleasant, WD, WN female who is sitting up in NAD Heart: regular, rate, and rhythm.   Lungs: Respiratory effort nonlabored Abd: soft, appropriately ttp, ND, +BS, honeycomb dressing to midline wound with small amount bloody drainage Psych: A&Ox3 with an appropriate affect.   Lab Results:  Recent Labs    10/27/20 0359 10/28/20 0407  WBC 16.7* 15.4*  HGB 13.1 12.4  HCT 38.5 37.2  PLT 214 193   BMET Recent Labs    10/27/20 0359 10/28/20 0407  NA 137 135  K 4.1 4.1  CL 108 104  CO2 23 25  GLUCOSE 158* 146*  BUN 10 7  CREATININE 0.58 0.54  CALCIUM 8.5* 8.4*   PT/INR No results for input(s): LABPROT, INR in the last 72 hours. CMP     Component Value Date/Time   NA 135 10/28/2020 0407   NA 143 10/12/2019 0856   K 4.1 10/28/2020 0407   CL 104 10/28/2020 0407   CO2 25 10/28/2020 0407   GLUCOSE 146 (H) 10/28/2020 0407   BUN 7 10/28/2020 0407   BUN 10 10/12/2019 0856   CREATININE 0.54 10/28/2020 0407   CALCIUM 8.4 (L) 10/28/2020 0407   PROT 6.9 10/26/2020 1615   PROT 6.8 10/12/2019 0856   ALBUMIN 4.4 10/26/2020 1615   ALBUMIN 4.6 10/12/2019 0856   AST 16 10/26/2020 1615   ALT 9  10/26/2020 1615   ALKPHOS 43 10/26/2020 1615   BILITOT 1.1 10/26/2020 1615   BILITOT 0.3 10/12/2019 0856   GFRNONAA >60 10/28/2020 0407   GFRAA 104 10/12/2019 0856   Lipase     Component Value Date/Time   LIPASE 29 10/26/2020 1615       Studies/Results: CT ABDOMEN PELVIS W CONTRAST  Addendum Date: 10/26/2020   ADDENDUM REPORT: 10/26/2020 21:08 ADDENDUM: Upon re-review of CT abdomen and pelvis performed October 26, 2020, the following findings are noted: There is an error in findings and impression section of the report regarding the volvulus loop of bowel. 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 Electronically Signed   By: Maudry Mayhew MD   On: 10/26/2020 21:08   Result Date: 10/26/2020 CLINICAL DATA:  Abdominal pain, bowel obstruction suspected. EXAM: CT ABDOMEN AND PELVIS WITH CONTRAST TECHNIQUE: Multidetector CT imaging of the abdomen and pelvis was performed using the standard protocol following bolus administration of intravenous contrast. CONTRAST:  49mL OMNIPAQUE IOHEXOL 350 MG/ML SOLN COMPARISON:  None. FINDINGS: Lower chest: No acute abnormality. Hepatobiliary: Diffuse hepatic steatosis. Gallbladder is unremarkable. No biliary ductal dilation. Pancreas: Prominence of the pancreatic duct measuring 3.5 mm in the head of the pancreas and 3 mm in the body, without  discrete obstructing lesion visualized. No evidence of pancreatic parenchymal atrophy. Spleen: Within normal limits. Adrenals/Urinary Tract: Adrenal glands are unremarkable. Kidneys are normal, without renal calculi, solid enhancing lesion, or hydronephrosis. Bladder is unremarkable. 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 There is adjacent inflammatory stranding without significant wall  thickening or hypoenhancement. Stomach is grossly unremarkable. No pathologic dilation of small bowel. Large volume of stool throughout the remaining colon. Vascular/Lymphatic: No abdominal aortic aneurysm. No pathologically enlarged abdominal or pelvic lymph nodes. Reproductive: Intrauterine device which appears to be in appropriate position. Simple appearing 2.3 cm right ovarian cyst. No follow-up imaging recommended. Note: This recommendation does not apply to premenarchal patients and to those with increased risk (genetic, family history, elevated tumor markers or other high-risk factors) of ovarian cancer. Reference: JACR 2020 Feb; 17(2):248-254 Other: Right lower quadrant mesenteric edema. No significant abdominopelvic ascites. No pneumoperitoneum. Musculoskeletal: L5-S1 discogenic disease. No acute osseous abnormality. IMPRESSION: 1. 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 No evidence of perforation. 2. Large volume of stool throughout the remaining colon. 3. Diffuse hepatic steatosis. 4. Prominence of the pancreatic duct measuring up to 3.5 mm in the head of the pancreas and 3 mm in the body, without discrete obstructing lesion visualized. Consider nonemergent MRCP with and without contrast for further evaluation. Electronically Signed: By: Maudry Mayhew MD On: 10/26/2020 19:56    Anti-infectives: Anti-infectives (From admission, onward)    Start     Dose/Rate Route Frequency Ordered Stop   10/27/20 0600  ceFAZolin (ANCEF) 2 g in sodium chloride 0.9 % 100 mL IVPB        2 g 200 mL/hr over 30 Minutes Intravenous Every 8 hours 10/27/20 0106 10/27/20 1453   10/26/20 2215  ceFAZolin (ANCEF) IVPB 1 g/50 mL premix  Status:  Discontinued        1 g 100 mL/hr over 30 Minutes Intravenous  Once 10/26/20 2205 10/27/20 0106   10/26/20 2115  ceFAZolin (ANCEF) IVPB 1 g/50 mL premix        1  g 100 mL/hr over 30 Minutes Intravenous On call to O.R. 10/26/20 2104 10/26/20 2221        Assessment/Plan Cecal volvulus POD2 S/p exploratory laparotomy, ileocecectomy 10/26/20 Dr. Fredricka Bonine - stick with CLD this AM - possibly advance this afternoon if less distended and passing more flatus  - working on pain control, pt reports hx of cocaine abuse - mobilize today, PT/OT Acute urinary retention - foley reinserted overnight, start urecholine and flomax, TOV over the weekend    FEN: CLD, decrease IVF to 75 cc/h VTE: LMWH ID: Ancef periop  LOS: 2 days    Juliet Rude, [facility] 10/28/2020, 11:14 AM Please see Amion for pager number during day hours 7:00am-4:30pm

## 2020-10-28 NOTE — Anesthesia Postprocedure Evaluation (Signed)
Anesthesia Post Note  Patient: Dawn Jackson  Procedure(s) Performed: EXPLORATORY LAPAROTOMY (Abdomen) PARTIAL COLECTOMY, ILEOCECECTOMY (Right: Abdomen)     Patient location during evaluation: PACU Anesthesia Type: General Level of consciousness: awake and alert Pain management: pain level controlled Vital Signs Assessment: post-procedure vital signs reviewed and stable Respiratory status: spontaneous breathing, nonlabored ventilation, respiratory function stable and patient connected to nasal cannula oxygen Cardiovascular status: blood pressure returned to baseline and stable Postop Assessment: no apparent nausea or vomiting Anesthetic complications: no   No notable events documented.  Last Vitals:  Vitals:   10/28/20 0140 10/28/20 0521  BP: 95/62 110/81  Pulse: 70 68  Resp: 17 17  Temp: 36.5 C 36.8 C  SpO2: 100% 97%    Last Pain:  Vitals:   10/28/20 1256  TempSrc:   PainSc: 9                  Hernan Turnage S

## 2020-10-28 NOTE — Evaluation (Signed)
Occupational Therapy Evaluation Patient Details Name: Dawn Jackson MRN: [id number] DOB: [birth year] Today's Date: 10/28/2020    History of Present Illness 50 y.o. female admitted 10/26/20 for cecal volvulus, s/p exploratory laparotomy with iliocecectomy 10/26/20.   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 Is fully independent at baseline. Currently patient limited 2* pain despite morning pain medications needing overall min G assist for out of bed activity for safety due to limited activity tolerance and mildly tremulous at times. Tolerated standing at sink for oral care then transfer to recliner chair using rolling walker for stability. Patient demonstrate ability to pull up socks long sitting in bed "I'm quite flexible." Will continue to follow acutely to progress patient balance and activity tolerance necessary to participate in daily routine and D/C home with family support.    Follow Up Recommendations  No OT follow up    Equipment Recommendations  None recommended by OT       Precautions / Restrictions Precautions Precautions: Other (comment) Precaution Comments: abdominal surgery Restrictions Weight Bearing Restrictions: No      Mobility Bed Mobility Overal bed mobility: Needs Assistance Bed Mobility: Rolling;Sidelying to Sit Rolling: Supervision Sidelying to sit: Min guard       General bed mobility comments: able to demonstrate log roll technique, min G for safety    Transfers Overall transfer level: Needs assistance Equipment used: Rolling walker (2 wheeled) Transfers: Sit to/from Stand Sit to Stand: Min guard         General transfer comment: min G for safety 2* pain/limited activity tolerance    Balance Overall balance assessment: Needs assistance Sitting-balance support: Feet supported Sitting balance-Leahy Scale: Good     Standing balance support: Single extremity supported Standing  balance-Leahy Scale: Poor Standing balance comment: reliant on UE support at sink side 2* pain                           ADL either performed or assessed with clinical judgement   ADL Overall ADL's : Needs assistance/impaired     Grooming: Min guard;Standing;Oral care Grooming Details (indicate cue type and reason): patient leans heavily onto sink due to abdominal pain, mildly tremulous 2* pain in standing min G for safety Upper Body Bathing: Set up;Sitting   Lower Body Bathing: Min guard;Sit to/from stand   Upper Body Dressing : Set up;Sitting   Lower Body Dressing: Set up;Min guard;Sit to/from stand;Sitting/lateral leans;Bed level Lower Body Dressing Details (indicate cue type and reason): from bed level patient is able to bring foot up to adjust sock, min G in standing for safety 2* pain/limited activity tolerance Toilet Transfer: Min IT sales professional Details (indicate cue type and reason): to recliner, patient request to use walker 2* pain. overall min G for safety due to pain limiting activity tolerance and making patient midly tremulous Toileting- Clothing Manipulation and Hygiene: Min guard;Sit to/from stand       Functional mobility during ADLs: Min guard;Rolling walker General ADL Comments: patient appears close to baseline with self care, primarily limited by pain impacting activity tolerance and balance      Pertinent Vitals/Pain Pain Assessment: Faces Faces Pain Scale: Hurts even more Pain Location: abdomen Pain Descriptors / Indicators: Operative site guarding;Guarding;Grimacing Pain Intervention(s): Premedicated before session;Patient requesting pain meds-RN notified     Hand Dominance Right   Extremity/Trunk Assessment Upper Extremity Assessment Upper Extremity Assessment: Overall WFL for  tasks assessed   Lower Extremity Assessment Lower Extremity Assessment: Defer to PT evaluation   Cervical / Trunk Assessment Cervical /  Trunk Assessment: Normal   Communication Communication Communication: No difficulties   Cognition Arousal/Alertness: Awake/alert Behavior During Therapy: WFL for tasks assessed/performed Overall Cognitive Status: Within Functional Limits for tasks assessed                                                Home Living Family/patient expects to be discharged to:: Private residence Living Arrangements: Spouse/significant other Available Help at Discharge: Family Type of Home: House Home Access: Stairs to enter Secretary/administrator of Steps: 3   Home Layout: Two level Alternate Level Stairs-Number of Steps: 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 Shower/Tub: Chief Strategy Officer: Standard     Home Equipment: None          Prior Functioning/Environment Level of Independence: Independent                 OT Problem List: Pain;Decreased activity tolerance;Impaired balance (sitting and/or standing);Decreased knowledge of precautions;Decreased safety awareness      OT Treatment/Interventions: Self-care/ADL training;Therapeutic activities;Patient/family education;Balance training;DME and/or AE instruction    OT Goals(Current goals can be found in the care plan section) Acute Rehab OT Goals Patient Stated Goal: return to work, be able to fly in late Aug for planned trip OT Goal Formulation: With patient Time For Goal Achievement: 11/11/20 Potential to Achieve Goals: Good  OT Frequency: Min 2X/week    AM-PAC OT "6 Clicks" Daily Activity     Outcome Measure Help from another person eating meals?: None Help from another person taking care of personal grooming?: A Little Help from another person toileting, which includes using toliet, bedpan, or urinal?: A Little Help from another person bathing (including washing, rinsing, drying)?: A Little Help from another person to put on and taking off regular upper body clothing?: A Little Help  from another person to put on and taking off regular lower body clothing?: A Little 6 Click Score: 19   End of Session Equipment Utilized During Treatment: Rolling walker Nurse Communication: Mobility status  Activity Tolerance: Patient limited by pain;Patient tolerated treatment well Patient left: in chair;with call bell/phone within reach;with chair alarm set  OT Visit Diagnosis: Pain;Unsteadiness on feet (R26.81) Pain - part of body:  (abdomen)                Time: 6010-9323 OT Time Calculation (min): 23 min Charges:  OT General Charges $OT Visit: 1 Visit OT Evaluation $OT Eval Low Complexity: 1 Low OT Treatments $Self Care/Home Management : 8-22 mins  Marlyce Huge OT OT pager: [phone]  Carmelia Roller 10/28/2020, 11:40 AM

## 2020-10-29 LAB — BASIC METABOLIC PANEL
Anion gap: 4 — ABNORMAL LOW (ref 5–15)
BUN: 6 mg/dL (ref 6–20)
CO2: 27 mmol/L (ref 22–32)
Calcium: 8 mg/dL — ABNORMAL LOW (ref 8.9–10.3)
Chloride: 106 mmol/L (ref 98–111)
Creatinine, Ser: 0.51 mg/dL (ref 0.44–1.00)
GFR, Estimated: >60 mL/min (ref >60)
Glucose, Bld: 87 mg/dL (ref 70–99)
Potassium: 4 mmol/L (ref 3.5–5.1)
Sodium: 137 mmol/L (ref 135–145)

## 2020-10-29 LAB — CBC
HCT: 32.7 % — ABNORMAL LOW (ref 36.0–46.0)
Hemoglobin: 10.8 g/dL — ABNORMAL LOW (ref 12.0–15.0)
MCH: 31.9 pg (ref 26.0–34.0)
MCHC: 33 g/dL (ref 30.0–36.0)
MCV: 96.5 fL (ref 80.0–100.0)
Platelets: 166 10*3/uL (ref 150–400)
RBC: 3.39 MIL/uL — ABNORMAL LOW (ref 3.87–5.11)
RDW: 13.5 % (ref 11.5–15.5)
WBC: 11.5 10*3/uL — ABNORMAL HIGH (ref 4.0–10.5)
nRBC: 0 % (ref 0.0–0.2)

## 2020-10-29 NOTE — Progress Notes (Signed)
     Assessment & Plan: Cecal volvulus POD#3 - S/p exploratory laparotomy, ileocecectomy 10/26/20 Dr. Fredricka Bonine - full liquid diet - working on pain control, pt reports hx of cocaine abuse - mobilize today, PT/OT Acute urinary retention - foley reinserted, started on urecholine and flomax   Patient with persistent ileus, urinary retention.  Will leave foley today and encourage OOB, ambulation in halls.         [name], MD       [facility]       Office: [phone]   Chief Complaint: Cecal volvulus  Subjective: Patient in bed, appears comfortable.  Foley in.  Objective: Vital signs in last 24 hours: Temp:  [97.8 F (36.6 C)-98.8 F (37.1 C)] 97.8 F (36.6 C) (07/16 7169) Pulse Rate:  [69-78] 69 (07/16 0632) Resp:  [18] 18 (07/16 6789) BP: (96-102)/(66-74) 102/74 (07/16 3810) SpO2:  [92 %-96 %] 92 % (07/16 0632) Last BM Date: 10/26/20  Intake/Output from previous day: 07/15 0701 - 07/16 0700 In: 2712 [P.O.:1200; I.V.:1312; IV Piggyback:200] Out: 2600 [Urine:2600] Intake/Output this shift: Total I/O In: 360 [P.O.:360] Out: 300 [Urine:300]  Physical Exam: HEENT - sclerae clear, mucous membranes moist Neck - soft Abdomen - moderate distension; few BS present; wound intact with honeycomb dressing Ext - no edema, non-tender Neuro - alert & oriented, no focal deficits  Lab Results:  Recent Labs    10/28/20 0407 10/29/20 0355  WBC 15.4* 11.5*  HGB 12.4 10.8*  HCT 37.2 32.7*  PLT 193 166   BMET Recent Labs    10/28/20 0407 10/29/20 0355  NA 135 137  K 4.1 4.0  CL 104 106  CO2 25 27  GLUCOSE 146* 87  BUN 7 6  CREATININE 0.54 0.51  CALCIUM 8.4* 8.0*   PT/INR No results for input(s): LABPROT, INR in the last 72 hours. Comprehensive Metabolic Panel:    Component Value Date/Time   NA 137 10/29/2020 0355   NA 135 10/28/2020 0407   NA 143 10/12/2019 0856   NA 141 01/10/2018 1550   K 4.0 10/29/2020 0355   K 4.1 10/28/2020 0407    CL 106 10/29/2020 0355   CL 104 10/28/2020 0407   CO2 27 10/29/2020 0355   CO2 25 10/28/2020 0407   BUN 6 10/29/2020 0355   BUN 7 10/28/2020 0407   BUN 10 10/12/2019 0856   BUN 14 01/10/2018 1550   CREATININE 0.51 10/29/2020 0355   CREATININE 0.54 10/28/2020 0407   GLUCOSE 87 10/29/2020 0355   GLUCOSE 146 (H) 10/28/2020 0407   CALCIUM 8.0 (L) 10/29/2020 0355   CALCIUM 8.4 (L) 10/28/2020 0407   AST 16 10/26/2020 1615   AST 19 10/12/2019 0856   ALT 9 10/26/2020 1615   ALT 14 10/12/2019 0856   ALKPHOS 43 10/26/2020 1615   ALKPHOS 53 10/12/2019 0856   BILITOT 1.1 10/26/2020 1615   BILITOT 0.3 10/12/2019 0856   BILITOT 0.7 06/01/2018 2013   BILITOT 0.5 01/10/2018 1550   PROT 6.9 10/26/2020 1615   PROT 6.8 10/12/2019 0856   PROT 7.8 06/01/2018 2013   PROT 6.9 01/10/2018 1550   ALBUMIN 4.4 10/26/2020 1615   ALBUMIN 4.6 10/12/2019 0856   ALBUMIN 5.4 (H) 06/01/2018 2013   ALBUMIN 4.6 01/10/2018 1550    Studies/Results: No results found.    [name] 10/29/2020   Patient ID: Dawn Jackson, female   DOB: 08-11-1970, 50 y.o.   MRN: [id number]

## 2020-10-30 NOTE — Progress Notes (Signed)
Assessment & Plan: Dawn Jackson POD#4 - S/p exploratory laparotomy, ileocecectomy 10/26/20 - Dr. Fredricka Bonine - full liquid diet tolerated - advance to regular diet today - working on pain control, pt reports hx of cocaine abuse; try to limit to oxycodone today - mobilize, PT/OT, ambulate in halls  Acute urinary retention - foley reinserted, started on urecholine and flomax - remove foley this AM - voiding trial  Encouraged OOB, ambulation.  Advance diet.  Foley out - voiding trial.  Discussed limiting narcotics.  Hopefully home tomorrow.         [name], MD       [facility]       Office: [phone]   Chief Complaint: Dawn Jackson  Subjective: Patient in bed.  Comfortable.  Anxious about Foley coming out.  Wants to try regular diet today.  Objective: Vital signs in last 24 hours: Temp:  [97.4 F (36.3 C)-98 F (36.7 C)] 98 F (36.7 C) (07/17 9485) Pulse Rate:  [64-72] 69 (07/17 0633) Resp:  [18] 18 (07/17 4627) BP: (107-115)/(68-79) 115/79 (07/17 0633) SpO2:  [94 %-98 %] 94 % (07/17 0633) Last BM Date: 10/26/20  Intake/Output from previous day: 07/16 0701 - 07/17 0700 In: 3395 [P.O.:1380; I.V.:1865; IV Piggyback:150] Out: 2700 [Urine:2700] Intake/Output this shift: No intake/output data recorded.  Physical Exam: HEENT - sclerae clear, mucous membranes moist Neck - soft Abdomen - protuberant, softer; BS present; wound dry and intact Ext - no edema, non-tender Neuro - alert & oriented, no focal deficits  Lab Results:  Recent Labs    10/28/20 0407 10/29/20 0355  WBC 15.4* 11.5*  HGB 12.4 10.8*  HCT 37.2 32.7*  PLT 193 166   BMET Recent Labs    10/28/20 0407 10/29/20 0355  NA 135 137  K 4.1 4.0  CL 104 106  CO2 25 27  GLUCOSE 146* 87  BUN 7 6  CREATININE 0.54 0.51  CALCIUM 8.4* 8.0*   PT/INR No results for input(s): LABPROT, INR in the last 72 hours. Comprehensive Metabolic Panel:    Component Value Date/Time    NA 137 10/29/2020 0355   NA 135 10/28/2020 0407   NA 143 10/12/2019 0856   NA 141 01/10/2018 1550   K 4.0 10/29/2020 0355   K 4.1 10/28/2020 0407   CL 106 10/29/2020 0355   CL 104 10/28/2020 0407   CO2 27 10/29/2020 0355   CO2 25 10/28/2020 0407   BUN 6 10/29/2020 0355   BUN 7 10/28/2020 0407   BUN 10 10/12/2019 0856   BUN 14 01/10/2018 1550   CREATININE 0.51 10/29/2020 0355   CREATININE 0.54 10/28/2020 0407   GLUCOSE 87 10/29/2020 0355   GLUCOSE 146 (H) 10/28/2020 0407   CALCIUM 8.0 (L) 10/29/2020 0355   CALCIUM 8.4 (L) 10/28/2020 0407   AST 16 10/26/2020 1615   AST 19 10/12/2019 0856   ALT 9 10/26/2020 1615   ALT 14 10/12/2019 0856   ALKPHOS 43 10/26/2020 1615   ALKPHOS 53 10/12/2019 0856   BILITOT 1.1 10/26/2020 1615   BILITOT 0.3 10/12/2019 0856   BILITOT 0.7 06/01/2018 2013   BILITOT 0.5 01/10/2018 1550   PROT 6.9 10/26/2020 1615   PROT 6.8 10/12/2019 0856   PROT 7.8 06/01/2018 2013   PROT 6.9 01/10/2018 1550   ALBUMIN 4.4 10/26/2020 1615   ALBUMIN 4.6 10/12/2019 0856   ALBUMIN 5.4 (H) 06/01/2018 2013   ALBUMIN 4.6 01/10/2018 1550    Studies/Results: No results found.  [name] 10/30/2020   Patient ID: Dawn Jackson, female   DOB: 21-Feb-1971, 50 y.o.   MRN: [id number]

## 2020-10-31 MED ORDER — HYDROMORPHONE HCL 1 MG/ML IJ SOLN
0.5000 mg | INTRAMUSCULAR | Status: DC | PRN
  Filled 2020-10-31: qty 1

## 2020-10-31 MED ORDER — TRAMADOL HCL 50 MG PO TABS
50.0000 mg | ORAL_TABLET | Freq: Two times a day (BID) | ORAL | Status: DC
  Administered 2020-10-31 – 2020-11-02 (×5): 50 mg via ORAL
  Filled 2020-10-31 (×6): qty 1

## 2020-10-31 MED ORDER — METHOCARBAMOL 500 MG PO TABS
1000.0000 mg | ORAL_TABLET | Freq: Three times a day (TID) | ORAL | Status: DC
  Administered 2020-10-31 – 2020-11-02 (×6): 1000 mg via ORAL
  Filled 2020-10-31 (×6): qty 2

## 2020-10-31 NOTE — Progress Notes (Signed)
5 Days Post-Op   Subjective/Chief Complaint: Complains of pain-unchanged   Objective: Vital signs in last 24 hours: Temp:  [98 F (36.7 C)-99 F (37.2 C)] 99 F (37.2 C) (07/18 0519) Pulse Rate:  [61-66] 61 (07/18 0519) Resp:  [15-18] 18 (07/18 0519) BP: (116-118)/(75-83) 118/75 (07/18 0519) SpO2:  [92 %-95 %] 95 % (07/18 0519) Last BM Date: 10/26/20  Intake/Output from previous day: 07/17 0701 - 07/18 0700 In: 2876.2 [P.O.:1620; I.V.:1098.1; IV Piggyback:158.2] Out: 1050 [Urine:1050] Intake/Output this shift: No intake/output data recorded.  General appearance: alert and cooperative Resp: clear to auscultation bilaterally Cardio: regular rate and rhythm GI: soft, pain mostly in right abd. Good bs. Incisions look good  Lab Results:  Recent Labs    10/29/20 0355  WBC 11.5*  HGB 10.8*  HCT 32.7*  PLT 166   BMET Recent Labs    10/29/20 0355  NA 137  K 4.0  CL 106  CO2 27  GLUCOSE 87  BUN 6  CREATININE 0.51  CALCIUM 8.0*   PT/INR No results for input(s): LABPROT, INR in the last 72 hours. ABG No results for input(s): PHART, HCO3 in the last 72 hours.  Invalid input(s): PCO2, PO2  Studies/Results: No results found.  Anti-infectives: Anti-infectives (From admission, onward)    Start     Dose/Rate Route Frequency Ordered Stop   10/27/20 0600  ceFAZolin (ANCEF) 2 g in sodium chloride 0.9 % 100 mL IVPB        2 g 200 mL/hr over 30 Minutes Intravenous Every 8 hours 10/27/20 0106 10/27/20 1453   10/26/20 2215  ceFAZolin (ANCEF) IVPB 1 g/50 mL premix  Status:  Discontinued        1 g 100 mL/hr over 30 Minutes Intravenous  Once 10/26/20 2205 10/27/20 0106   10/26/20 2115  ceFAZolin (ANCEF) IVPB 1 g/50 mL premix        1 g 100 mL/hr over 30 Minutes Intravenous On call to O.R. 10/26/20 2104 10/26/20 2221       Assessment/Plan: s/p Procedure(s): EXPLORATORY LAPAROTOMY (N/A) PARTIAL COLECTOMY, ILEOCECECTOMY (Right) Advance diet Continue to adjust  pain meds. Plan for d/c once pain is under control Cecal volvulus POD#5 - S/p exploratory laparotomy, ileocecectomy 10/26/20 - Dr. Fredricka Bonine - full liquid diet tolerated - advance to regular diet today - working on pain control, pt reports hx of cocaine abuse; try to adjust pain meds for better control - mobilize, PT/OT, ambulate in halls   Acute urinary retention - foley reinserted, started on urecholine and flomax - remove foley this AM - voiding trial   Encouraged OOB, ambulation.  Advance diet.  Foley out - voiding trial.  Discussed limiting narcotics.  Hopefully home tomorrow.  LOS: 5 days    Dawn Jackson 10/31/2020

## 2020-11-01 MED ORDER — POLYETHYLENE GLYCOL 3350 17 G PO PACK: 17.0000 g | PACK | Freq: Two times a day (BID) | ORAL | 0 refills | Status: DC | PRN

## 2020-11-01 MED ORDER — BISACODYL 10 MG RE SUPP
10.0000 mg | Freq: Once | RECTAL | Status: AC
  Administered 2020-11-01: 10 mg via RECTAL
  Filled 2020-11-01: qty 1

## 2020-11-01 MED ORDER — OXYCODONE HCL 5 MG PO TABS: 5.0000 mg | ORAL_TABLET | Freq: Four times a day (QID) | ORAL | 0 refills | Status: DC | PRN

## 2020-11-01 MED ORDER — IBUPROFEN 400 MG PO TABS
600.0000 mg | ORAL_TABLET | Freq: Three times a day (TID) | ORAL | Status: DC
  Administered 2020-11-01 – 2020-11-02 (×3): 600 mg via ORAL
  Filled 2020-11-01 (×3): qty 1

## 2020-11-01 MED ORDER — HYDROMORPHONE HCL 1 MG/ML IJ SOLN: 0.5000 mg | INTRAMUSCULAR | Status: DC | PRN

## 2020-11-01 MED ORDER — ACETAMINOPHEN 500 MG PO TABS: 1000.0000 mg | ORAL_TABLET | Freq: Three times a day (TID) | ORAL | 0 refills | Status: DC | PRN

## 2020-11-01 MED ORDER — ONDANSETRON 4 MG PO TBDP: 4.0000 mg | ORAL_TABLET | Freq: Four times a day (QID) | ORAL | 0 refills | Status: DC | PRN

## 2020-11-01 MED ORDER — HYDROMORPHONE HCL 1 MG/ML IJ SOLN: 0.5000 mg | Freq: Two times a day (BID) | INTRAMUSCULAR | Status: DC | PRN

## 2020-11-01 MED ORDER — TRAMADOL HCL 50 MG PO TABS: 50.0000 mg | ORAL_TABLET | Freq: Four times a day (QID) | ORAL | 0 refills | Status: DC | PRN

## 2020-11-01 MED ORDER — DOCUSATE SODIUM 100 MG PO CAPS: 100.0000 mg | ORAL_CAPSULE | Freq: Two times a day (BID) | ORAL | 0 refills | Status: DC | PRN

## 2020-11-01 MED ORDER — POLYETHYLENE GLYCOL 3350 17 G PO PACK
17.0000 g | PACK | Freq: Two times a day (BID) | ORAL | Status: DC
  Administered 2020-11-01 – 2020-11-02 (×3): 17 g via ORAL
  Filled 2020-11-01 (×3): qty 1

## 2020-11-01 MED ORDER — METHOCARBAMOL 500 MG PO TABS: 1000.0000 mg | ORAL_TABLET | Freq: Three times a day (TID) | ORAL | 0 refills | Status: DC
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
## 2020-11-01 NOTE — Progress Notes (Signed)
6 Days Post-Op  Subjective: CC: Pain improved but does not feel ready to go this morning. Generalized abdominal pain reported. Tolerating diet without n/v. Passing flatus. No BM since surgery. About to work with PT. Voiding.   Objective: Vital signs in last 24 hours: Temp:  [97.9 F (36.6 C)-99.4 F (37.4 C)] 97.9 F (36.6 C) (07/19 0541) Pulse Rate:  [55-63] 55 (07/19 0541) Resp:  [17-18] 18 (07/19 0541) BP: (120-143)/(81-89) 121/81 (07/19 0541) SpO2:  [93 %-95 %] 94 % (07/19 0541) Last BM Date: 10/26/20  Intake/Output from previous day: 07/18 0701 - 07/19 0700 In: 600 [P.O.:600] Out: 3900 [Urine:3900] Intake/Output this shift: Total I/O In: 120 [P.O.:120] Out: 700 [Urine:700]  PE: Gen: Awake and alert, NAD Heart: Reg rate and rhythm  Lungs: CTA b/l, normal rate and effort Abd: Soft, mild distension, mild generalized tenderness on exam, honeycomb dressing removed - incision c/d/I,  +BS Msk: No LE edema   Lab Results:  No results for input(s): WBC, HGB, HCT, PLT in the last 72 hours. BMET No results for input(s): NA, K, CL, CO2, GLUCOSE, BUN, CREATININE, CALCIUM in the last 72 hours. PT/INR No results for input(s): LABPROT, INR in the last 72 hours. CMP     Component Value Date/Time   NA 137 10/29/2020 0355   NA 143 10/12/2019 0856   K 4.0 10/29/2020 0355   CL 106 10/29/2020 0355   CO2 27 10/29/2020 0355   GLUCOSE 87 10/29/2020 0355   BUN 6 10/29/2020 0355   BUN 10 10/12/2019 0856   CREATININE 0.51 10/29/2020 0355   CALCIUM 8.0 (L) 10/29/2020 0355   PROT 6.9 10/26/2020 1615   PROT 6.8 10/12/2019 0856   ALBUMIN 4.4 10/26/2020 1615   ALBUMIN 4.6 10/12/2019 0856   AST 16 10/26/2020 1615   ALT 9 10/26/2020 1615   ALKPHOS 43 10/26/2020 1615   BILITOT 1.1 10/26/2020 1615   BILITOT 0.3 10/12/2019 0856   GFRNONAA >60 10/29/2020 0355   GFRAA 104 10/12/2019 0856   Lipase     Component Value Date/Time   LIPASE 29 10/26/2020 1615        Studies/Results: No results found.  Anti-infectives: Anti-infectives (From admission, onward)    Start     Dose/Rate Route Frequency Ordered Stop   10/27/20 0600  ceFAZolin (ANCEF) 2 g in sodium chloride 0.9 % 100 mL IVPB        2 g 200 mL/hr over 30 Minutes Intravenous Every 8 hours 10/27/20 0106 10/27/20 1453   10/26/20 2215  ceFAZolin (ANCEF) IVPB 1 g/50 mL premix  Status:  Discontinued        1 g 100 mL/hr over 30 Minutes Intravenous  Once 10/26/20 2205 10/27/20 0106   10/26/20 2115  ceFAZolin (ANCEF) IVPB 1 g/50 mL premix        1 g 100 mL/hr over 30 Minutes Intravenous On call to O.R. 10/26/20 2104 10/26/20 2221        Assessment/Plan Cecal volvulus POD#6 - S/p exploratory laparotomy, ileocecectomy 10/26/20 - Dr. Fredricka Bonine - Tolerating diet and passing flatus - Working on pain control, pt reports hx of cocaine abuse - mobilize, PT/OT, ambulate in halls - Pulm toilet - PM check for possible discharge if pain controlled   FEN - Reg, BID colace and miralax. Suppository today VTE - SCDs, Lovenox  ID - Ancef periop Foley - voiding Follow-Up - Dr. Fredricka Bonine   Hx PTSD Hx of substance abuse Hx of DVT (2017)   LOS: 6  days    Jacinto Halim , [facility] 11/01/2020, 10:36 AM Please see Amion for pager number during day hours 7:00am-4:30pm

## 2020-11-01 NOTE — Discharge Instructions (Signed)
[facility]      [facility] [phone]  OPEN ABDOMINAL SURGERY: POST OP INSTRUCTIONS  Always review your discharge instruction sheet given to you by the facility where your surgery was performed.  IF YOU HAVE DISABILITY OR FAMILY LEAVE FORMS, YOU MUST BRING THEM TO THE OFFICE FOR PROCESSING.  PLEASE DO NOT GIVE THEM TO YOUR DOCTOR.  A prescription for pain medication may be given to you upon discharge.  Take your pain medication as prescribed, if needed.  If narcotic pain medicine is not needed, then you may take acetaminophen (Tylenol) or ibuprofen (Advil) as needed. Take your usually prescribed medications unless otherwise directed. If you need a refill on your pain medication, please contact your pharmacy. They will contact our office to request authorization.  Prescriptions will not be filled after 5pm or on week-ends. You should follow a light diet the first few days after arrival home, such as soup and crackers, pudding, etc.unless your doctor has advised otherwise. A high-fiber, low fat diet can be resumed as tolerated.   Be sure to include lots of fluids daily. Most patients will experience some swelling and bruising on the chest and neck area.  Ice packs will help.  Swelling and bruising can take several days to resolve Most patients will experience some swelling and bruising in the area of the incision. Ice pack will help. Swelling and bruising can take several days to resolve..  It is common to experience some constipation if taking pain medication after surgery.  Increasing fluid intake and taking a stool softener will usually help or prevent this problem from occurring.  A mild laxative (Milk of Magnesia or Miralax) should be taken according to package directions if there are no bowel movements after 48 hours.  You may have steri-strips (small skin tapes) in place directly over the incision.  These strips should be left on the skin for 7-10 days.  If your surgeon used skin  glue on the incision, you may shower in 24 hours.  The glue will flake off over the next 2-3 weeks.  Any sutures or staples will be removed at the office during your follow-up visit. You may find that a light gauze bandage over your incision may keep your staples from being rubbed or pulled. You may shower and replace the bandage daily. ACTIVITIES:  You may resume regular (light) daily activities beginning the next day--such as daily self-care, walking, climbing stairs--gradually increasing activities as tolerated.  You may have sexual intercourse when it is comfortable.  Refrain from any heavy lifting or straining until approved by your doctor. You may drive when you no longer are taking prescription pain medication, you can comfortably wear a seatbelt, and you can safely maneuver your car and apply brakes Return to Work: ___________________________________ You should see your doctor in the office for a follow-up appointment approximately two weeks after your surgery.  Make sure that you call for this appointment within a day or two after you arrive home to insure a convenient appointment time. OTHER INSTRUCTIONS:  _____________________________________________________________ _____________________________________________________________  WHEN TO CALL YOUR DOCTOR: Fever over 101.0 Inability to urinate Nausea and/or vomiting Extreme swelling or bruising Continued bleeding from incision. Increased pain, redness, or drainage from the incision. Difficulty swallowing or breathing Muscle cramping or spasms. Numbness or tingling in hands or feet or around lips.  The clinic staff is available to answer your questions during regular business hours.  Please don't hesitate to call and ask to speak to one of   the nurses if you have concerns.  For further questions, please visit [email or web address]  

## 2020-11-01 NOTE — Progress Notes (Signed)
Physical Therapy Treatment Patient Details Name: Dawn Jackson MRN: [id number] DOB: 17-Mar-1971 Today's Date: 11/01/2020    History of Present Illness 50 y.o. female admitted 10/26/20 for cecal volvulus, s/p exploratory laparotomy with iliocecectomy 10/26/20.    PT Comments    Pt had just seen surgical PA prior to arrival and states, "He said I can't lift more then 15 pounds.  What else aren't they telling me?"  So reviewed appropriate technique and posture for bed mobility, ambulation, and verbally discussed stairs.  Pt agreeable to no further f/u PT needs at this time.  PT to sign off as pt has met acute goals.    Follow Up Recommendations  No PT follow up     Equipment Recommendations  None recommended by PT    Recommendations for Other Services       Precautions / Restrictions Precautions Precautions: Other (comment) Precaution Comments: abdominal surgery Restrictions Weight Bearing Restrictions: No    Mobility  Bed Mobility Overal bed mobility: Needs Assistance Bed Mobility: Rolling;Sidelying to Sit;Sit to Sidelying Rolling: Supervision Sidelying to sit: Supervision     Sit to sidelying: Supervision General bed mobility comments: reviewed proper log roll technique as pt twisting and sitting upright in bed; pt demonstrated    Transfers Overall transfer level: Modified independent                  Ambulation/Gait Ambulation/Gait assistance: Supervision;Modified independent (Device/Increase time) Gait Distance (Feet): 400 Feet Assistive device: None Gait Pattern/deviations: Step-through pattern;Trunk flexed     General Gait Details: pt with "waddle" type gait due to compensating for pain; pt able to ambulate 61 'x2 with proper gait however returns to "waddle" and mild trunk flexion; pt encouraged to perform "normal" gait as tolerated and watch herself in mirror at home   Stairs             Wheelchair Mobility    Modified Rankin (Stroke Patients  Only)       Balance                                            Cognition Arousal/Alertness: Awake/alert Behavior During Therapy: WFL for tasks assessed/performed Overall Cognitive Status: Within Functional Limits for tasks assessed                                        Exercises      General Comments        Pertinent Vitals/Pain Pain Assessment: Faces Faces Pain Scale: Hurts little more Pain Location: abdomen Pain Descriptors / Indicators: Operative site guarding;Guarding;Grimacing Pain Intervention(s): Repositioned;Monitored during session    Home Living                      Prior Function            PT Goals (current goals can now be found in the care plan section) Acute Rehab PT Goals Patient Stated Goal: to go home today Progress towards PT goals: Goals met/education completed, patient discharged from PT    Frequency    Min 3X/week      PT Plan Other (comment) (d/c from acute PT)    Co-evaluation              AM-PAC PT "6 Clicks"  Mobility   Outcome Measure  Help needed turning from your back to your side while in a flat bed without using bedrails?: A Little Help needed moving from lying on your back to sitting on the side of a flat bed without using bedrails?: A Little Help needed moving to and from a bed to a chair (including a wheelchair)?: A Little Help needed standing up from a chair using your arms (e.g., wheelchair or bedside chair)?: A Little Help needed to walk in hospital room?: A Little Help needed climbing 3-5 steps with a railing? : A Little 6 Click Score: 18    End of Session   Activity Tolerance: Patient tolerated treatment well Patient left: Other (comment) (in bathroom, "independent" in room per white board)   PT Visit Diagnosis: Difficulty in walking, not elsewhere classified (R26.2)     Time: 1020-1030 PT Time Calculation (min) (ACUTE ONLY): 10 min  Charges:  $Gait  Training: 8-22 mins                     Arlyce Dice, DPT Acute Rehabilitation Services Pager: [phone] Office: [phone]    York Ram E 11/01/2020, 1:10 PM

## 2020-11-01 NOTE — Progress Notes (Signed)
Occupational Therapy Treatment Patient Details Name: Dawn Jackson MRN: [id number] DOB: 1971-04-15 Today's Date: 11/01/2020    History of present illness 50 y.o. female admitted 10/26/20 for cecal volvulus, s/p exploratory laparotomy with iliocecectomy 10/26/20.   OT comments  Patient was educated on energy conservation strategies for transitioning home. Patient verbalized understanding. All questions were answered on this date. Patient reported being ready to go home. Patient was able to complete LB dressing with MI while seated. Patient plans to d/c home with husband on this date.   Follow Up Recommendations  No OT follow up    Equipment Recommendations  None recommended by OT    Recommendations for Other Services      Precautions / Restrictions Precautions Precaution Comments: abdominal surgery Restrictions Weight Bearing Restrictions: No       Mobility Bed Mobility Overal bed mobility: Modified Independent                  Transfers                      Balance                                           ADL either performed or assessed with clinical judgement   ADL                                               Vision       Perception     Praxis      Cognition Arousal/Alertness: Awake/alert Behavior During Therapy: WFL for tasks assessed/performed Overall Cognitive Status: Within Functional Limits for tasks assessed                                          Exercises     Shoulder Instructions       General Comments      Pertinent Vitals/ Pain       Pain Assessment: Faces Faces Pain Scale: Hurts a little bit Pain Location: headache  Home Living                                          Prior Functioning/Environment              Frequency  Min 2X/week        Progress Toward Goals  OT Goals(current goals can now be found in the care plan section)   Progress towards OT goals: Progressing toward goals  Acute Rehab OT Goals Patient Stated Goal: to go home today OT Goal Formulation: With patient Time For Goal Achievement: 11/11/20 Potential to Achieve Goals: Good  Plan Discharge plan remains appropriate    Co-evaluation                 AM-PAC OT "6 Clicks" Daily Activity     Outcome Measure   Help from another person eating meals?: None Help from another person taking care of personal grooming?: A Little Help from another person toileting, which includes using toliet, bedpan, or urinal?:  A Little Help from another person bathing (including washing, rinsing, drying)?: A Little Help from another person to put on and taking off regular upper body clothing?: None Help from another person to put on and taking off regular lower body clothing?: A Little 6 Click Score: 20    End of Session    OT Visit Diagnosis: Pain;Unsteadiness on feet (R26.81) Pain - part of body:  (patient reported headache on this date)   Activity Tolerance Patient tolerated treatment well   Patient Left in bed;with call bell/phone within reach   Nurse Communication          Time: 1145-1156 OT Time Calculation (min): 11 min  Charges: OT General Charges $OT Visit: 1 Visit OT Treatments $Self Care/Home Management : 8-22 mins  Sharyn Blitz OTR/L, MS Acute Rehabilitation Department Office# [phone] Pager# [phone]    Chalmers Guest Dewane Timson 11/01/2020, 12:02 PM

## 2020-11-02 NOTE — Progress Notes (Signed)
Discharge instructions discussed with patient verbalized agreement and understanding 

## 2021-01-24 ENCOUNTER — Other Ambulatory Visit: Payer: Self-pay | Admitting: Surgery

## 2021-01-24 DIAGNOSIS — R1030 Lower abdominal pain, unspecified: Secondary | ICD-10-CM

## 2021-02-08 ENCOUNTER — Ambulatory Visit
Admission: RE | Admit: 2021-02-08 | Discharge: 2021-02-08 | Disposition: A | Discharge status: Home / Self Care | Payer: 59 | Source: Ambulatory Visit | Attending: Surgery | Admitting: Surgery

## 2021-02-08 DIAGNOSIS — R1030 Lower abdominal pain, unspecified: Secondary | ICD-10-CM

## 2021-02-08 MED ORDER — IOPAMIDOL (ISOVUE-300) INJECTION 61%
100.0000 mL | Freq: Once | INTRAVENOUS | Status: AC | PRN
  Administered 2021-02-08: 100 mL via INTRAVENOUS

## 2021-03-07 ENCOUNTER — Other Ambulatory Visit: Payer: Self-pay

## 2021-03-07 ENCOUNTER — Ambulatory Visit (INDEPENDENT_AMBULATORY_CARE_PROVIDER_SITE_OTHER): Payer: 59 | Admitting: Neurology

## 2021-03-07 ENCOUNTER — Encounter: Payer: Self-pay | Admitting: Neurology

## 2021-03-07 VITALS — BP 100/67 | HR 70 | Ht 67.0 in | Wt 138.2 lb

## 2021-03-07 DIAGNOSIS — R251 Tremor, unspecified: Secondary | ICD-10-CM | POA: Diagnosis not present

## 2021-03-07 DIAGNOSIS — R519 Headache, unspecified: Secondary | ICD-10-CM | POA: Diagnosis not present

## 2021-03-07 MED ORDER — KETOROLAC TROMETHAMINE 60 MG/2ML IM SOLN
60.0000 mg | Freq: Once | INTRAMUSCULAR | Status: AC
Start: 2021-03-07 — End: 2021-03-07
  Administered 2021-03-07: 60 mg via INTRAMUSCULAR

## 2021-03-07 MED ORDER — PRIMIDONE 50 MG PO TABS: ORAL_TABLET | ORAL | 3 refills | Status: DC

## 2021-03-07 MED ORDER — RIZATRIPTAN BENZOATE 10 MG PO TBDP: 10.0000 mg | ORAL_TABLET | ORAL | 11 refills | Status: DC | PRN

## 2021-03-07 NOTE — Progress Notes (Signed)
Dr. Lucia Gaskins gave verbal order for 60mg  toradol injection. Gave in  gluteus After injection  pt states  injection burned . Informed patient she can sit for a few minutes . Pt Stated no she has to get back to work went to check out . Pt stated she was going to be ok. Placed bandade on injection site   Did Call patent back to make sure she felt better . Pt stated it was stinging a little but she will be fine . Pt thanked me for calling back and checking on her

## 2021-03-07 NOTE — Patient Instructions (Addendum)
- Start Primidone(Mysoline) at bedtime for essential tremor. Start low dose can always increase - can't take propranolol for tremor due to hypotension - can stop the Trazodone (takes  prn at bedtime) since primidone is sedating - She has menopausal symptoms, Gabapentin could help with headaches and hot flashes and tremors, she will think about it taking it, never helped in the past - Discussed Topiramate for her headaches which is sometimes used in tremors but is not a very good tremor medications, she will try primidone - will try rizatriptan prn for migraines/headaches: Please take one tablet at the onset of your headache. If it does not improve the symptoms please take one additional tablet. Do not take more then 2 tablets in 24hrs. Do not take use more then 2 to 3 times in a week. - will give toradol shot for headache today   Essential Tremor A tremor is trembling or shaking that a person cannot control. Most tremors affect the hands or arms. Tremors can also affect the head, vocal cords, legs, and other parts of the body. Essential tremor is a tremor without a known cause. Usually, it occurs while a person is trying to perform an action. It tends to get worse gradually as a person ages. What are the causes? The cause of this condition is not known. What increases the risk? You are more likely to develop this condition if: You have a family member with essential tremor. You are age 64 or older. You take certain medicines. What are the signs or symptoms? The main sign of a tremor is a rhythmic shaking of certain parts of your body that is uncontrolled and unintentional. You may: Have difficulty eating with a spoon or fork. Have difficulty writing. Nod your head up and down or side to side. Have a quivering voice. The shaking may: Get worse over time. Come and go. Be more noticeable on one side of your body. Get worse due to stress, fatigue, caffeine, and extreme heat or cold. How  is this diagnosed? This condition may be diagnosed based on: Your symptoms and medical history. A physical exam. There is no single test to diagnose an essential tremor. However, your health care provider may order tests to rule out other causes of your condition. These may include: Blood and urine tests. Imaging studies of your brain, such as CT scan and MRI. A test that measures involuntary muscle movement (electromyogram). How is this treated? Treatment for essential tremor depends on the severity of the condition. Some tremors may go away without treatment. Mild tremors may not need treatment if they do not affect your day-to-day life. Severe tremors may need to be treated using one or more of the following options: Medicines. Lifestyle changes. Occupational or physical therapy. Follow these instructions at home: Lifestyle  Do not use any products that contain nicotine or tobacco, such as cigarettes and e-cigarettes. If you need help quitting, ask your health care provider. Limit your caffeine intake as told by your health care provider. Try to get 8 hours of sleep each night. Find ways to manage your stress that fits your lifestyle and personality. Consider trying meditation or yoga. Try to anticipate stressful situations and allow extra time to manage them. If you are struggling emotionally with the effects of your tremor, consider working with a mental health provider. General instructions Take over-the-counter and prescription medicines only as told by your health care provider. Avoid extreme heat and extreme cold. Keep all follow-up visits as told by  your health care provider. This is important. Visits may include physical therapy visits. Contact a health care provider if: You experience any changes in the location or intensity of your tremors. You start having a tremor after starting a new medicine. You have tremor with other symptoms, such  as: Numbness. Tingling. Pain. Weakness. Your tremor gets worse. Your tremor interferes with your daily life. You feel down, blue, or sad for at least 2 weeks in a row. Worrying about your tremor and what other people think about you interferes with your everyday life functions, including relationships, work, or school. Summary Essential tremor is a tremor without a known cause. Usually, it occurs when you are trying to perform an action. You are more likely to develop this condition if you have a family member with essential tremor. The main sign of a tremor is a rhythmic shaking of certain parts of your body that is uncontrolled and unintentional. Treatment for essential tremor depends on the severity of the condition. This information is not intended to replace advice given to you by your health care provider. Make sure you discuss any questions you have with your health care provider. Document Revised: 12/23/2019 Document Reviewed: 12/25/2019 Elsevier Patient Education  2022 [employer]   Primidone Tablets What is this medication? PRIMIDONE (PRI mi done) prevents and controls seizures in people with epilepsy. It works by calming overactive nerves in your body. Also used primarily in essential tremor. This medicine may be used for other purposes; ask your health care provider or pharmacist if you have questions. COMMON BRAND NAME(S): Mysoline What should I tell my care team before I take this medication? They need to know if you have any of these conditions: Kidney disease Liver disease Porphyria Suicidal thoughts, plans, or attempt by you or a family member An unusual or allergic reaction to primidone, phenobarbital, other barbiturates or seizure medications, other medications, foods, dyes, or preservatives Pregnant or trying to get pregnant Breast-feeding How should I use this medication? Take this medication by mouth with a glass of water. Follow the directions on the  prescription label. Take your doses at regular intervals. Do not take your medication more often than directed. Do not stop taking except on the advice of your care team. A special MedGuide will be given to you by the pharmacist with each prescription and refill. Be sure to read this information carefully each time. Contact your care team about the use of this medication in children. Special care may be needed. While this medication may be prescribed for children for selected conditions, precautions do apply. Overdosage: If you think you have taken too much of this medicine contact a poison control center or emergency room at once. NOTE: This medicine is only for you. Do not share this medicine with others. What if I miss a dose? If you miss a dose, take it as soon as you can. If it is almost time for your next dose, take only that dose. Do not take double or extra doses. What may interact with this medication? Do not take this medication with any of the following: Voriconazole This medication may also interact with the following: Cancer-treating medications Cyclosporine Disopyramide Doxycycline Estrogen or progestin hormones Medications for depression, anxiety or other mental health conditions Medications for treating HIV infection or AIDS Modafinil Prescription pain medications Quinidine Warfarin This list may not describe all possible interactions. Give your health care provider a list of all the medicines, herbs, non-prescription drugs, or dietary supplements you use.  Also tell them if you smoke, drink alcohol, or use illegal drugs. Some items may interact with your medicine. What should I watch for while using this medication? Visit your care team for regular checks on your progress. It may be 2 to 3 weeks before you see the full effects of this medication. Do not suddenly stop taking this medication, you may increase the risk of seizures. Your care team may want to gradually reduce the  dose. Wear a medical identification bracelet or chain to say you have epilepsy, and carry a card that lists all your medications. You may get drowsy or dizzy. Do not drive, use machinery, or do anything that needs mental alertness until you know how this medication affects you. Do not stand or sit up quickly, especially if you are an older patient. This reduces the risk of dizzy or fainting spells. Alcohol may interfere with the effect of this medication. Avoid alcoholic drinks. Birth control pills may not work properly while you are taking this medication. Talk to your care team about using an extra method of birth control. The use of this medication may increase the chance of suicidal thoughts or actions. Pay special attention to how you are responding while on this medication. Any worsening of mood, or thoughts of suicide or dying should be reported to your care team right away. Women who become pregnant while using this medication may enroll in the [place] [employer] Antiepileptic Drug Pregnancy Registry by calling [phone]. This registry collects information about the safety of antiepileptic medication use during pregnancy. This medication may cause a decrease in vitamin D and folic acid. You should make sure that you get enough vitamins while you are taking this medication. Discuss the foods you eat and the vitamins you take with your care team. What side effects may I notice from receiving this medication? Side effects that you should report to your care team as soon as possible: Allergic reactions--skin rash, itching, hives, swelling of the face, lips, tongue, or throat CNS depression--slow or shallow breathing, shortness of breath, feeling faint, dizziness, confusion, trouble staying awake Thoughts of suicide or self-harm, worsening mood, or feelings of depression Side effects that usually do not require medical attention (report to your care team if they continue or are  bothersome): Dizziness Drowsiness Loss of balance or coordination Nausea This list may not describe all possible side effects. Call your doctor for medical advice about side effects. You may report side effects to FDA at 1-800-FDA-1088. Where should I keep my medication? Keep out of the reach of children and pets. This medication may cause accidental overdose and death if it is taken by other adults, children, or pets. Mix any unused medication with a substance like cat litter or coffee grounds. Then throw the medication away in a sealed container like a sealed bag or a coffee can with a lid. Do not use the medication after the expiration date. Store at room temperature between 15 and 30 degrees C (59 and 86 degrees F). NOTE: This sheet is a summary. It may not cover all possible information. If you have questions about this medicine, talk to your doctor, pharmacist, or health care provider.  Ketorolac Injection What is this medication? KETOROLAC (kee toe ROLE ak) treats short-term moderate to severe pain. It works by decreasing inflammation. It belongs to a group of medications called NSAIDs. This medicine may be used for other purposes; ask your health care provider or pharmacist if you have questions. COMMON BRAND NAME(S): Toradol  What should I tell my care team before I take this medication? They need to know if you have any of these conditions: Bleeding disorder Coronary artery bypass graft (CABG) within the past 2 weeks Heart attack Heart disease Heart failure High blood pressure If you often drink alcohol Kidney disease Liver disease Lung or breathing disease, such as asthma or COPD Receiving steroids like dexamethasone or prednisone Smoke tobacco cigarettes Stomach bleeding Stomach ulcers, other stomach or intestine problems Take medication to treat or prevent blood clots An unusual or allergic reaction to ketorolac, other medications, foods, dyes, or preservatives Pregnant  or trying to get pregnant Breast-feeding How should I use this medication? This medication is injected into a vein or muscle. It is given in a hospital or clinic setting. A special MedGuide will be given to you before each treatment. Be sure to read this information carefully each time. Talk to your care team about the use of this medication in children. Special care may be needed. Patients over 71 years of age may have a stronger reaction and need a smaller dose. Overdosage: If you think you have taken too much of this medicine contact a poison control center or emergency room at once. NOTE: This medicine is only for you. Do not share this medicine with others. What if I miss a dose? This does not apply. This medication is not for regular use. What may interact with this medication? Do not take this medication with any of the following: Aspirin and aspirin-like medications Cidofovir Methotrexate NSAIDs, medications for pain and inflammation, like ibuprofen or naproxen Pentoxifylline Probenecid This medication may also interact with the following: Alcohol Alendronate Alprazolam Carbamazepine Diuretics Flavocoxid Fluoxetine Ginkgo lithium Medications for blood pressure like enalapril Medications that affect platelets like pentoxifylline Medications that treat or prevent blood clots like heparin, warfarin Muscle relaxants Pemetrexed Phenytoin Thiothixene This list may not describe all possible interactions. Give your health care provider a list of all the medicines, herbs, non-prescription drugs, or dietary supplements you use. Also tell them if you smoke, drink alcohol, or use illegal drugs. Some items may interact with your medicine. What should I watch for while using this medication? Your condition will be monitored carefully while you are receiving this medication. Do not use this medication for more than 5 days. It is only used for short-term treatment of moderate to severe  pain. The risk of side effects such as kidney damage and stomach bleeding are higher if used for more than 5 days. Do not take other medications that contain aspirin, ibuprofen, or naproxen with this medication. Side effects such as stomach upset, nausea, or ulcers may be more likely to occur. Many non-prescription medications contain aspirin, ibuprofen, or naproxen. Always read labels carefully. This medication can cause serious ulcers and bleeding in the stomach. It can happen with no warning. Smoking, drinking alcohol, older age, and poor health can also increase risks. Call your care team right away if you have stomach pain or blood in your vomit or stool. Alcohol may interfere with the effect of this medication. Avoid alcoholic drinks. This medication may cause serious skin reactions. They can happen weeks to months after starting the medication. Contact your care team right away if you notice fevers or flu-like symptoms with a rash. The rash may be red or purple and then turn into blisters or peeling of the skin. Or, you might notice a red rash with swelling of the face, lips or lymph nodes in your neck or under  your arms. Talk to your care team if you are pregnant before taking this medication. Taking this medication between weeks 20 and 30 of pregnancy may harm your unborn baby. Your care team will monitor you closely if you need to take it. After 30 weeks of pregnancy, do not take this medication. This medication does not prevent a heart attack or stroke. This medication may increase the chance of a heart attack or stroke. The chance may increase the longer you use this medication or if you have heart disease. If you take aspirin to prevent a heart attack or stroke, talk to your care team about using this medication. You may get drowsy or dizzy. Do not drive, use machinery, or do anything that needs mental alertness until you know how this medication affects you. Do not stand up or sit up quickly,  especially if you are an older patient. This reduces the risk of dizzy or fainting spells. What side effects may I notice from receiving this medication? Side effects that you should report to your care team as soon as possible: Allergic reactions--skin rash, itching, hives, swelling of the face, lips, tongue, or throat Heart attack--pain or tightness in the chest, shoulders, arms, or jaw, nausea, shortness of breath, cold or clammy skin, feeling faint or lightheaded Heart failure--shortness of breath, swelling of ankles, feet, or hands, sudden weight gain, unusual weakness or fatigue Increase in blood pressure Kidney injury--decrease in the amount of urine, swelling of the ankles, hands, or feet Liver injury--right upper belly pain, loss of appetite, nausea, light-colored stool, dark yellow or brown urine, yellowing skin or eyes, unusual weakness or fatigue Low red blood cell count--unusual weakness or fatigue, dizziness, headache, trouble breathing Rash, fever, and swollen lymph nodes Redness, blistering, peeling, or loosening of the skin, including inside the mouth Stomach bleeding--bloody or black, tar-like stools, vomiting blood or brown material that looks like coffee grounds Stroke--sudden numbness or weakness of the face, arm, or leg, trouble speaking, confusion, trouble walking, loss of balance or coordination, dizziness, severe headache, change in vision Side effects that usually do not require medical attention (report to your care team if they continue or are bothersome): Constipation Diarrhea Dizziness Headache Nausea Stomach pain This list may not describe all possible side effects. Call your doctor for medical advice about side effects. You may report side effects to FDA at 1-800-FDA-1088. Where should I keep my medication? This medication is given in a hospital or clinic. It will not be stored at home. NOTE: This sheet is a summary. It may not cover all possible information.  If you have questions about this medicine, talk to your doctor, pharmacist, or health care provider.  2022 Elsevier/Gold Standard (2021-01-02 00:00:00) Rizatriptan Disintegrating Tablets What is this medication? RIZATRIPTAN (rye za TRIP tan) treats migraines. It works by blocking pain signals and narrowing blood vessels in the brain. It belongs to a group of medications called triptans. It is not used to prevent migraines. This medicine may be used for other purposes; ask your health care provider or pharmacist if you have questions. COMMON BRAND NAME(S): Maxalt-MLT What should I tell my care team before I take this medication? They need to know if you have any of these conditions: Cigarette smoker Circulation problems in fingers and toes Diabetes Heart disease High blood pressure High cholesterol History of irregular heartbeat History of stroke Kidney disease Liver disease Stomach or intestine problems An unusual or allergic reaction to rizatriptan, other medications, foods, dyes, or preservatives Pregnant or  trying to get pregnant Breast-feeding How should I use this medication? Take this medication by mouth. Follow the directions on the prescription label. Leave the tablet in the sealed blister pack until you are ready to take it. With dry hands, open the blister and gently remove the tablet. If the tablet breaks or crumbles, throw it away and take a new tablet out of the blister pack. Place the tablet in the mouth and allow it to dissolve, and then swallow. Do not cut, crush, or chew this medication. You do not need water to take this medication. Do not take it more often than directed. Talk to your care team regarding the use of this medication in children. While this medication may be prescribed for children as young as 6 years for selected conditions, precautions do apply. Overdosage: If you think you have taken too much of this medicine contact a poison control center or emergency  room at once. NOTE: This medicine is only for you. Do not share this medicine with others. What if I miss a dose? This does not apply. This medication is not for regular use. What may interact with this medication? Do not take this medication with any of the following medications: Certain medications for migraine headache like almotriptan, eletriptan, frovatriptan, naratriptan, rizatriptan, sumatriptan, zolmitriptan Ergot alkaloids like dihydroergotamine, ergonovine, ergotamine, methylergonovine MAOIs like Carbex, Eldepryl, Marplan, Nardil, and Parnate This medication may also interact with the following medications: Certain medications for depression, anxiety, or psychotic disorders Propranolol This list may not describe all possible interactions. Give your health care provider a list of all the medicines, herbs, non-prescription drugs, or dietary supplements you use. Also tell them if you smoke, drink alcohol, or use illegal drugs. Some items may interact with your medicine. What should I watch for while using this medication? Visit your care team for regular checks on your progress. Tell your care team if your symptoms do not start to get better or if they get worse. You may get drowsy or dizzy. Do not drive, use machinery, or do anything that needs mental alertness until you know how this medication affects you. Do not stand up or sit up quickly, especially if you are an older patient. This reduces the risk of dizzy or fainting spells. Alcohol may interfere with the effect of this medication. Your mouth may get dry. Chewing sugarless gum or sucking hard candy and drinking plenty of water may help. Contact your care team if the problem does not go away or is severe. If you take migraine medications for 10 or more days a month, your migraines may get worse. Keep a diary of headache days and medication use. Contact your care team if your migraine attacks occur more frequently. What side effects  may I notice from receiving this medication? Side effects that you should report to your care team as soon as possible: Allergic reactions--skin rash, itching, hives, swelling of the face, lips, tongue, or throat Burning, pain, tingling, or color changes in the legs or feet Heart attack--pain or tightness in the chest, shoulders, arms, or jaw, nausea, shortness of breath, cold or clammy skin, feeling faint or lightheaded Heart rhythm changes--fast or irregular heartbeat, dizziness, feeling faint or lightheaded, chest pain, trouble breathing Increase in blood pressure Irritability, confusion, fast or irregular heartbeat, muscle stiffness, twitching muscles, sweating, high fever, seizure, chills, vomiting, diarrhea, which may be signs of serotonin syndrome Raynaud's--cool, numb, or painful fingers or toes that may change color from pale, to blue, to red  Seizures Stroke--sudden numbness or weakness of the face, arm, or leg, trouble speaking, confusion, trouble walking, loss of balance or coordination, dizziness, severe headache, change in vision Sudden or severe stomach pain, nausea, vomiting, fever, or bloody diarrhea Vision loss Side effects that usually do not require medical attention (report to your care team if they continue or are bothersome): Dizziness General discomfort or fatigue This list may not describe all possible side effects. Call your doctor for medical advice about side effects. You may report side effects to FDA at 1-800-FDA-1088. Where should I keep my medication? Keep out of the reach of children and pets. Store at room temperature between 15 and 30 degrees C (59 and 86 degrees F). Protect from light and moisture. Throw away any unused medication after the expiration date. NOTE: This sheet is a summary. It may not cover all possible information. If you have questions about this medicine, talk to your doctor, pharmacist, or health care provider.  2022 Elsevier/Gold Standard  (2020-05-11 00:00:00)   2022 Elsevier/Gold Standard (2020-12-05 00:00:00)
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
## 2021-03-08 LAB — T4, FREE: Free T4: 1.02 ng/dL (ref 0.82–1.77)

## 2021-03-08 LAB — TSH: TSH: 1.56 u[IU]/mL (ref 0.450–4.500)

## 2021-03-13 ENCOUNTER — Other Ambulatory Visit: Payer: Self-pay | Admitting: Surgery

## 2021-03-13 DIAGNOSIS — K8689 Other specified diseases of pancreas: Secondary | ICD-10-CM

## 2021-03-16 ENCOUNTER — Encounter: Payer: Self-pay | Admitting: Neurology

## 2021-04-04 ENCOUNTER — Ambulatory Visit
Admission: RE | Admit: 2021-04-04 | Discharge: 2021-04-04 | Disposition: A | Discharge status: Home / Self Care | Payer: 59 | Source: Ambulatory Visit | Attending: Surgery | Admitting: Surgery

## 2021-04-04 ENCOUNTER — Other Ambulatory Visit: Payer: Self-pay

## 2021-04-04 DIAGNOSIS — K8689 Other specified diseases of pancreas: Secondary | ICD-10-CM

## 2021-04-04 IMAGING — MR MR ABDOMEN WO/W CM MRCP
11 of 19 series · 23 of 48 positions shown · IV contrast (multihance)
Comparison: [DATE]

CLINICAL DATA: Evaluate pancreatic duct dilatation.

EXAM:
MRI ABDOMEN WITHOUT AND WITH CONTRAST (INCLUDING MRCP)
TECHNIQUE: Multiplanar multisequence MR imaging of the abdomen was performed
both before and after the administration of intravenous contrast.
Heavily T2-weighted images of the biliary and pancreatic ducts were
obtained, and three-dimensional MRCP images were rendered by post
processing.
CONTRAST:  11mL MULTIHANCE GADOBENATE DIMEGLUMINE 529 MG/ML IV SOLN

[Series 4: cor haste · coronal · 5.5mm · 0.70mm/px · 2 of 34 slices shown]
[im 1/34]
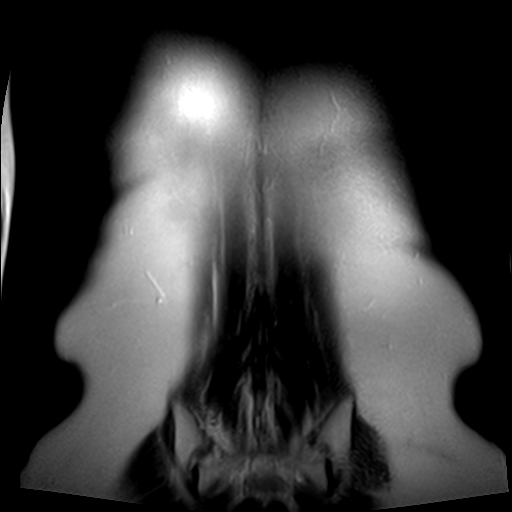
[im 34/34]
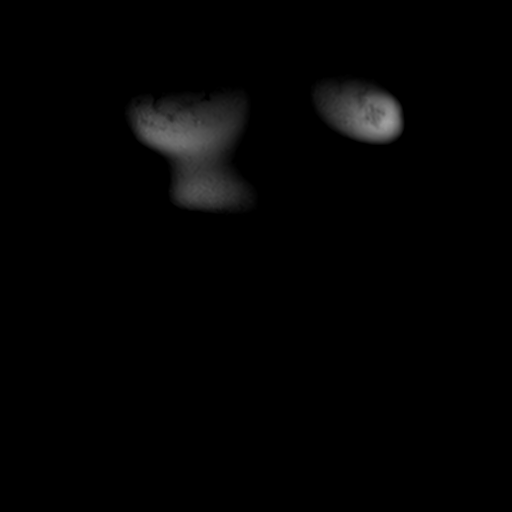

[Series 5: haste axial · axial · 5.5mm · 0.70mm/px · z∈[-74,+152]mm · 2 of 34 slices shown]
[im 1/34]
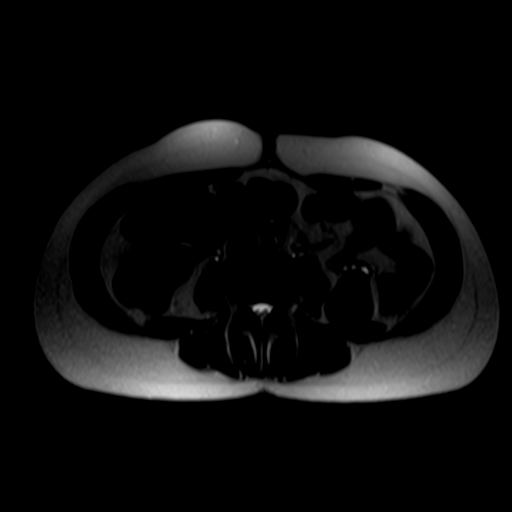
[im 34/34]
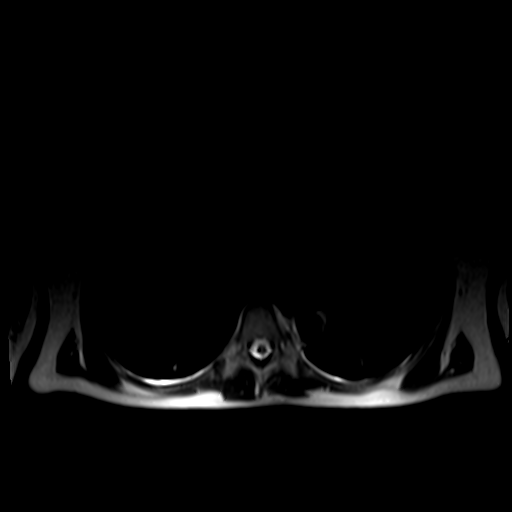

[Series 6: T1 · axial · 5.5mm · 0.70mm/px · z∈[-74,+152]mm · 3 of 68 slices shown]
[im 1/68]
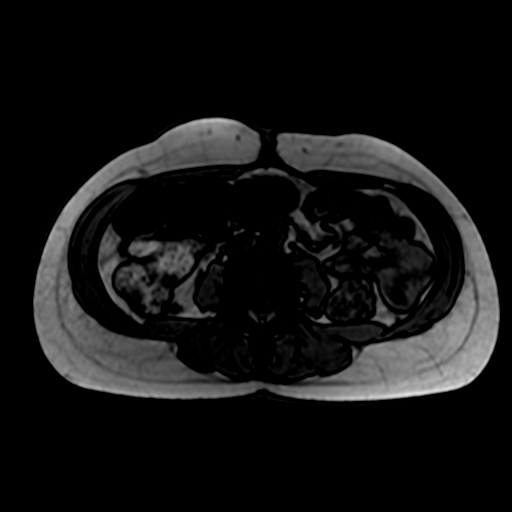
[im 34/68]
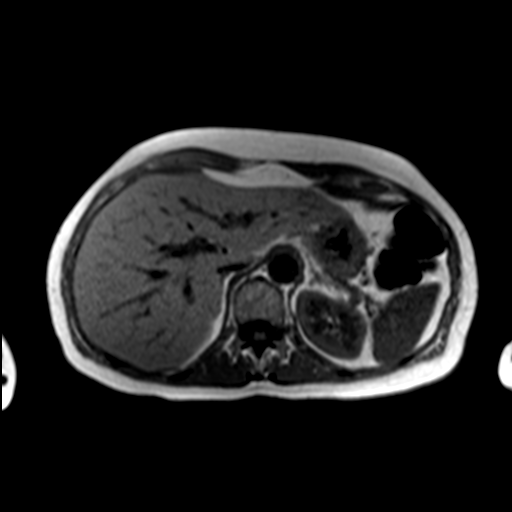
[im 68/68]
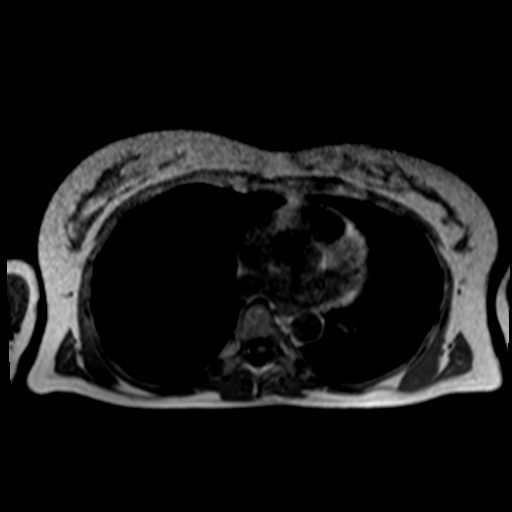

[Series 7: T2 · axial · 5.5mm · 0.94mm/px · 1 of 34 slices shown (1 of 2)]
[im 1/34]
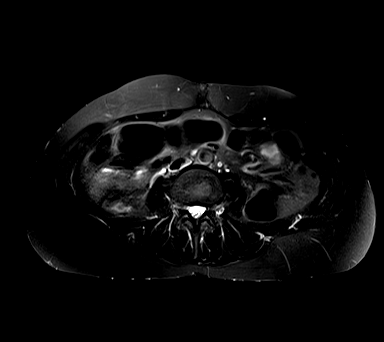

[Series 10: [id number]_trig · axial · 6.0mm · 1.88mm/px · z∈[-76,+154]mm · 4 of 99 slices shown]
[im 1/99]
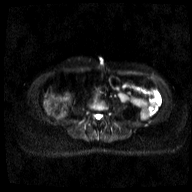
[im 33/99]
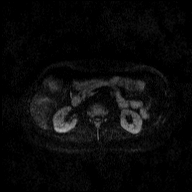
[im 66/99]
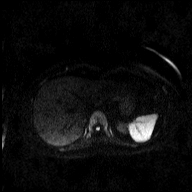
[im 99/99]
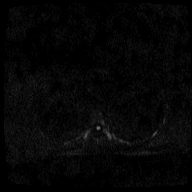

[Series 11: [id number]_trig_adc · axial · 6.0mm · 1.88mm/px · 1 of 33 slices shown]
[im 1/33]
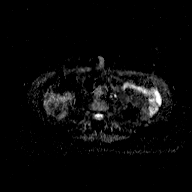

[Series 12: T2 · coronal · 3.0mm · 0.70mm/px · 2 of 52 slices shown (2 of 2)]
[im 1/52]
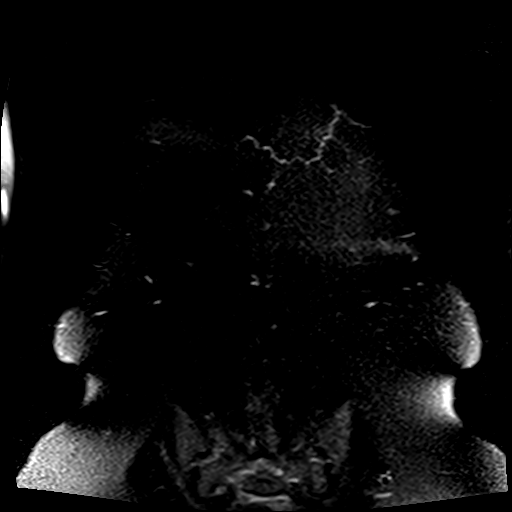
[im 52/52]
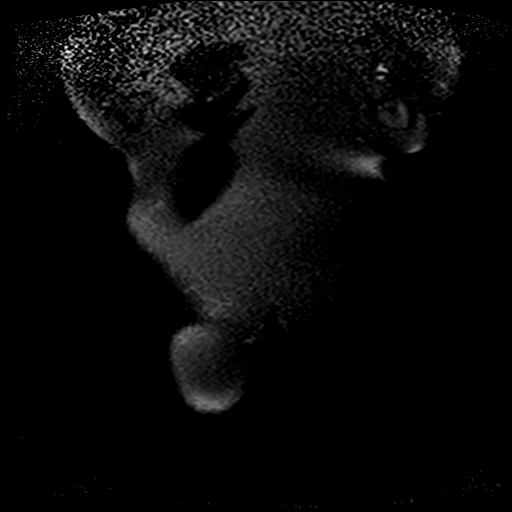

[Series 13: bSSFP · axial · 5.5mm · 0.70mm/px · 1 of 34 slices shown]
[im 1/34]
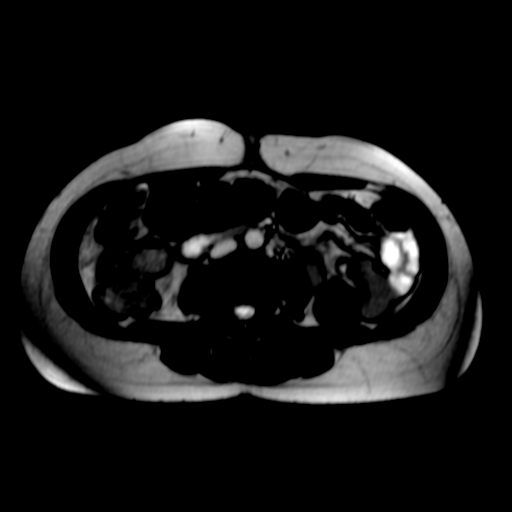

[Series 16: T1 dynamic · axial · non-contrast · 3.0mm · 0.70mm/px · z∈[-75,+138]mm · 3 of 72 slices shown]
[im 1/72]
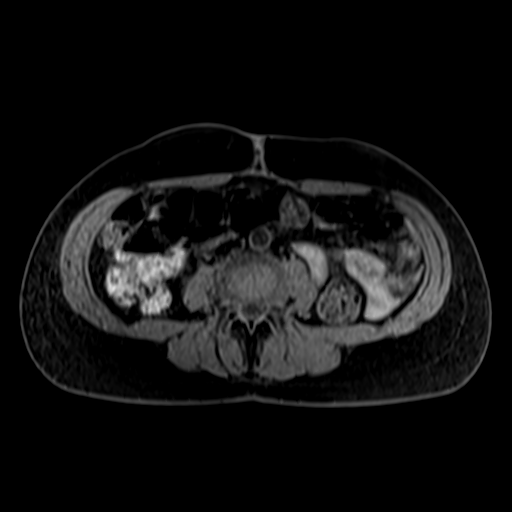
[im 36/72]
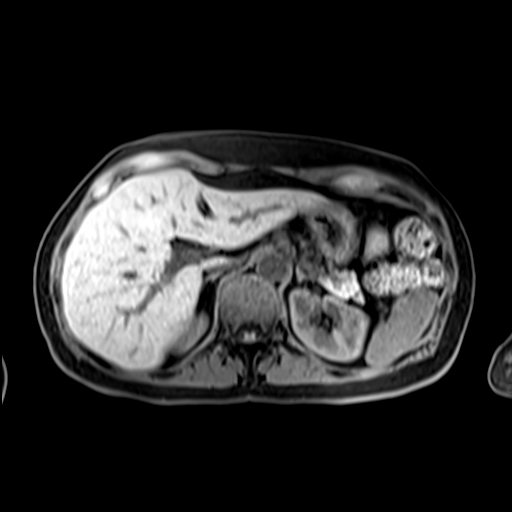
[im 72/72]
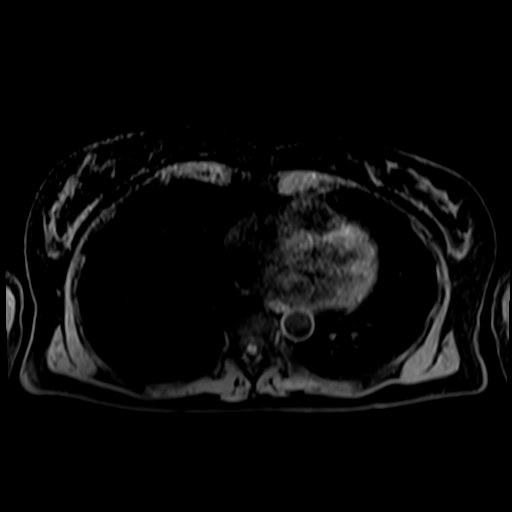

[Series 18: T1 dynamic post-contrast · axial · 3.0mm · 0.70mm/px · z∈[-75,+138]mm · 3 of 72 slices shown (1 of 2)]
[im 1/72]
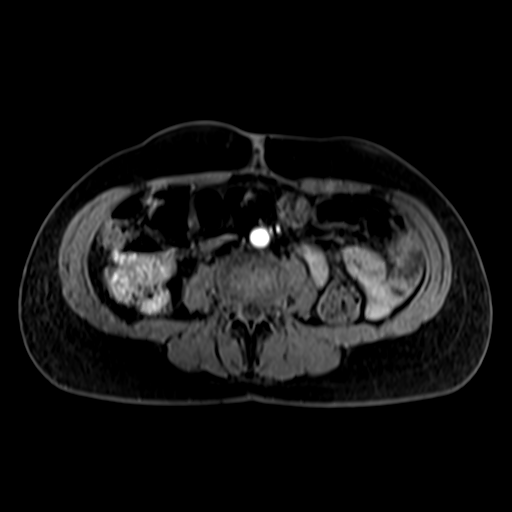
[im 36/72]
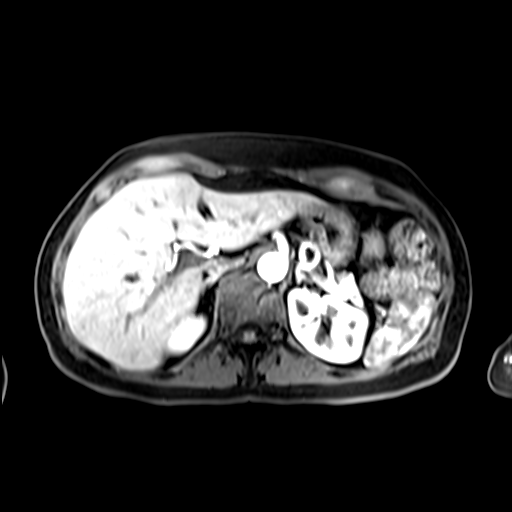
[im 72/72]
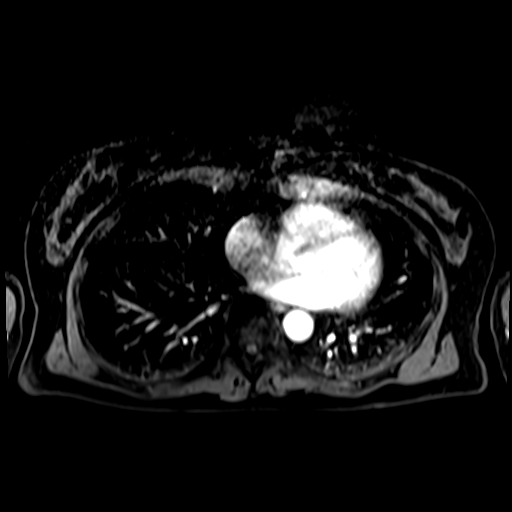

[Series 19: T1 dynamic post-contrast · axial · 3.0mm · 0.70mm/px · 1 of 72 slices shown (2 of 2)]
[im 1/72]
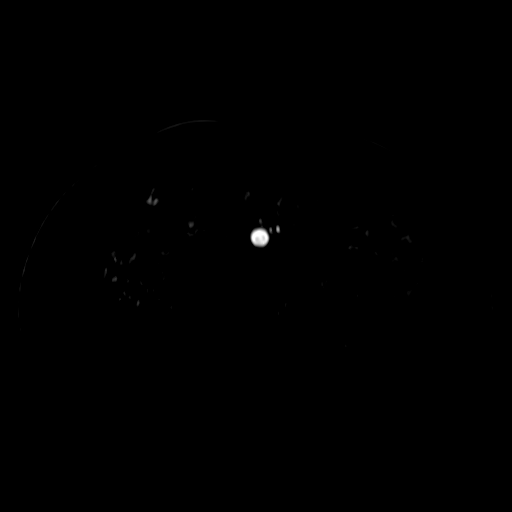

[23 of 48 positions shown; findings below may reference images not displayed]

FINDINGS: Lower chest: No acute findings.

Hepatobiliary: Small cyst in right lobe of liver measures 3 mm. No
suspicious enhancing liver lesions. Gallbladder appears normal.
There is no gallstones or gallbladder wall thickening. The common
bile duct has a normal caliber measuring 5 mm. No signs of
choledocholithiasis or obstructing mass.

Pancreas: There is mild diffuse increase caliber of the main
pancreatic duct which measures up to 4 mm. No obstructing mass
identified. No pancreatic inflammation.

Spleen:  Within normal limits in size and appearance.

Adrenals/Urinary Tract: Normal adrenal glands. No kidney mass or
hydronephrosis.

Stomach/Bowel: Visualized portions within the abdomen are
unremarkable.

Vascular/Lymphatic: No pathologically enlarged lymph nodes
identified. No abdominal aortic aneurysm demonstrated.

Other:  None.

Musculoskeletal: No suspicious bone lesions identified.
IMPRESSION: 1. No acute findings within the abdomen.
2. Mild diffuse increase caliber of the main pancreatic duct which
measures up to 4 mm. This appears stable since [DATE]. No
underlying mass or inflammation identified. Recommend additional
follow-up imaging in 6 months with repeat MRI with contrast material
of the pancreas to confirm ongoing stability.

## 2021-04-04 MED ORDER — GADOBENATE DIMEGLUMINE 529 MG/ML IV SOLN
11.0000 mL | Freq: Once | INTRAVENOUS | Status: AC | PRN
  Administered 2021-04-04: 17:00:00 11 mL via INTRAVENOUS

## 2021-04-07 ENCOUNTER — Other Ambulatory Visit: Payer: 59

## 2021-04-20 DIAGNOSIS — R69 Illness, unspecified: Secondary | ICD-10-CM | POA: Diagnosis not present

## 2021-04-25 ENCOUNTER — Other Ambulatory Visit: Payer: Self-pay | Admitting: Neurology

## 2021-04-25 MED ORDER — PRIMIDONE 50 MG PO TABS: 100.0000 mg | ORAL_TABLET | Freq: Every day | ORAL | 3 refills | Status: DC

## 2021-04-26 DIAGNOSIS — R69 Illness, unspecified: Secondary | ICD-10-CM | POA: Diagnosis not present

## 2021-05-05 DIAGNOSIS — C44319 Basal cell carcinoma of skin of other parts of face: Secondary | ICD-10-CM | POA: Diagnosis not present

## 2021-05-10 DIAGNOSIS — R69 Illness, unspecified: Secondary | ICD-10-CM | POA: Diagnosis not present

## 2021-05-15 ENCOUNTER — Other Ambulatory Visit: Payer: Self-pay | Admitting: Neurology

## 2021-05-15 MED ORDER — PRIMIDONE 50 MG PO TABS: 150.0000 mg | ORAL_TABLET | Freq: Every day | ORAL | 6 refills | Status: DC

## 2021-05-24 DIAGNOSIS — R69 Illness, unspecified: Secondary | ICD-10-CM | POA: Diagnosis not present

## 2021-06-07 DIAGNOSIS — R69 Illness, unspecified: Secondary | ICD-10-CM | POA: Diagnosis not present

## 2021-06-19 ENCOUNTER — Telehealth: Payer: Self-pay | Admitting: Neurology

## 2021-06-19 ENCOUNTER — Ambulatory Visit (INDEPENDENT_AMBULATORY_CARE_PROVIDER_SITE_OTHER): Payer: 59 | Admitting: Neurology

## 2021-06-19 DIAGNOSIS — R251 Tremor, unspecified: Secondary | ICD-10-CM | POA: Diagnosis not present

## 2021-06-19 DIAGNOSIS — G250 Essential tremor: Secondary | ICD-10-CM | POA: Diagnosis not present
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
## 2021-06-20 ENCOUNTER — Telehealth: Payer: Self-pay | Admitting: Neurology

## 2021-06-20 NOTE — Telephone Encounter (Signed)
Referral sent to [facility] [phone]. ?

## 2021-06-28 ENCOUNTER — Encounter: Payer: Self-pay | Admitting: Neurology

## 2021-06-28 DIAGNOSIS — R69 Illness, unspecified: Secondary | ICD-10-CM | POA: Diagnosis not present

## 2021-07-05 ENCOUNTER — Other Ambulatory Visit: Payer: Self-pay | Admitting: Neurology

## 2021-07-05 MED ORDER — PRIMIDONE 50 MG PO TABS: 200.0000 mg | ORAL_TABLET | Freq: Every day | ORAL | 3 refills | Status: DC

## 2021-07-14 DIAGNOSIS — R69 Illness, unspecified: Secondary | ICD-10-CM | POA: Diagnosis not present

## 2021-08-01 ENCOUNTER — Other Ambulatory Visit: Payer: Self-pay | Admitting: Internal Medicine

## 2021-08-01 DIAGNOSIS — Z1231 Encounter for screening mammogram for malignant neoplasm of breast: Secondary | ICD-10-CM

## 2021-08-02 DIAGNOSIS — R69 Illness, unspecified: Secondary | ICD-10-CM | POA: Diagnosis not present

## 2021-08-16 DIAGNOSIS — R69 Illness, unspecified: Secondary | ICD-10-CM | POA: Diagnosis not present

## 2021-08-23 ENCOUNTER — Ambulatory Visit: Payer: 59 | Admitting: Neurology

## 2021-08-23 ENCOUNTER — Encounter: Payer: Self-pay | Admitting: Neurology

## 2021-08-23 VITALS — BP 90/66 | HR 80 | Ht 67.0 in | Wt 131.2 lb

## 2021-08-23 DIAGNOSIS — G250 Essential tremor: Secondary | ICD-10-CM | POA: Diagnosis not present

## 2021-08-23 MED ORDER — PRIMIDONE 50 MG PO TABS: 250.0000 mg | ORAL_TABLET | Freq: Every day | ORAL | 3 refills | Status: DC

## 2021-08-23 MED ORDER — GABAPENTIN 300 MG PO CAPS: 300.0000 mg | ORAL_CAPSULE | Freq: Three times a day (TID) | ORAL | 11 refills | Status: DC
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
## 2021-08-23 NOTE — Patient Instructions (Addendum)
?Tremor: Essential tremor but feels her "no-no" tremor in the head is becoming worse, the hands are better and weren't bad to start with. She can put her hand on her head and stop the tremor, could this be a cervical dystonia? But doesn't appear to be cervical dystonia. the primidone helps, but tremor is embarrassing her, she did have an aunt with tremor but that was much later in life, patient feels she is only 58, she has been on trazodone and cymbalta but for many years and that did not coincide with her head tremor, she request a movement disorder specialist, can try [facility] ?- can't take propranolol due to hypotension.  ?- On primidone and helping a little, can increase to 250mg , her psychiatrist prescribes clonazepam could try it but discussed risks of long-term benzodiazepines, will add on gabapentin and titrate to higher dose, can send to [facility] as well.  ?- Discussed Topiramate for her headaches/migraines which is sometimes used in tremors, can try that ?- will continue rizatriptan prn for migraines/headaches - continue works well, but may consider a preventative since appears to be getting worse ?- she has >9 total headaches+migraine a month, will continue Rizatriptan, adding on Gabapentin for tremors but may help with migraine, next can try Topiramate as both a migraine preventative and to help with tremors ?- other options include deep brain stimulation, in clinical trials is MRI guided ultrasound treatment of the basal ganglia but would have to refer to movement disorder specialist ?- can call and request Dr Athar(movement disorders specialist) and she can review chart and see if she feels there is anything she can do further ?- [facility]eral to [facility] ? ?Meds ordered this encounter  ?Medications  ? primidone (MYSOLINE) 50 MG tablet  ?  Sig: Take 5 tablets (250 mg total) by mouth at bedtime.  ?  Dispense:  450 tablet  ?  Refill:  3  ? gabapentin (NEURONTIN) 300 MG capsule  ?  Sig: Take 1  capsule (300 mg total) by mouth 3 (three) times daily.  ?  Dispense:  90 capsule  ?  Refill:  11  ? ? ? ?Gabapentin Capsules or Tablets ?What is this medication? ?GABAPENTIN (GA ba pen tin) treats nerve pain. It may also be used to prevent and control seizures in people with epilepsy. It works by calming overactive nerves in your body. ?This medicine may be used for other purposes; ask your health care provider or pharmacist if you have questions. ?COMMON BRAND NAME(S): Active-PAC with Gabapentin, [place], [place], Neurontin ?What should I tell my care team before I take this medication? ?They need to know if you have any of these conditions: ?Alcohol or substance use disorder ?Kidney disease ?Lung or breathing disease ?Suicidal thoughts, plans, or attempt; a previous suicide attempt by you or a family member ?An unusual or allergic reaction to gabapentin, other medications, foods, dyes, or preservatives ?Pregnant or trying to get pregnant ?Breast-feeding ?How should I use this medication? ?Take this medication by mouth with a glass of water. Follow the directions on the prescription label. You can take it with or without food. If it upsets your stomach, take it with food. Take your medication at regular intervals. Do not take it more often than directed. Do not stop taking except on your care team's advice. ?If you are directed to break the 600 or 800 mg tablets in half as part of your dose, the extra half tablet should be used for the next dose. If  you have not used the extra half tablet within 28 days, it should be thrown away. ?A special MedGuide will be given to you by the pharmacist with each prescription and refill. Be sure to read this information carefully each time. ?Talk to your care team about the use of this medication in children. While this medication may be prescribed for children as young as 3 years for selected conditions, precautions do apply. ?Overdosage: If you think you have taken too much of  this medicine contact a poison control center or emergency room at once. ?NOTE: This medicine is only for you. Do not share this medicine with others. ?What if I miss a dose? ?If you miss a dose, take it as soon as you can. If it is almost time for your next dose, take only that dose. Do not take double or extra doses. ?What may interact with this medication? ?Alcohol ?Antihistamines for allergy, cough, and cold ?Certain medications for anxiety or sleep ?Certain medications for depression like amitriptyline, fluoxetine, sertraline ?Certain medications for seizures like phenobarbital, primidone ?Certain medications for stomach problems ?General anesthetics like halothane, isoflurane, methoxyflurane, propofol ?Local anesthetics like lidocaine, pramoxine, tetracaine ?Medications that relax muscles for surgery ?Opioid medications for pain ?Phenothiazines like chlorpromazine, mesoridazine, prochlorperazine, thioridazine ?This list may not describe all possible interactions. Give your health care provider a list of all the medicines, herbs, non-prescription drugs, or dietary supplements you use. Also tell them if you smoke, drink alcohol, or use illegal drugs. Some items may interact with your medicine. ?What should I watch for while using this medication? ?Visit your care team for regular checks on your progress. You may want to keep a record at home of how you feel your condition is responding to treatment. You may want to share this information with your care team at each visit. You should contact your care team if your seizures get worse or if you have any new types of seizures. Do not stop taking this medication or any of your seizure medications unless instructed by your care team. Stopping your medication suddenly can increase your seizures or their severity. ?This medication may cause serious skin reactions. They can happen weeks to months after starting the medication. Contact your care team right away if you  notice fevers or flu-like symptoms with a rash. The rash may be red or purple and then turn into blisters or peeling of the skin. Or, you might notice a red rash with swelling of the face, lips or lymph nodes in your neck or under your arms. ?Wear a medical identification bracelet or chain if you are taking this medication for seizures. Carry a card that lists all your medications. ?This medication may affect your coordination, reaction time, or judgment. Do not drive or operate machinery until you know how this medication affects you. Sit up or stand slowly to reduce the risk of dizzy or fainting spells. Drinking alcohol with this medication can increase the risk of these side effects. ?Your mouth may get dry. Chewing sugarless gum or sucking hard candy, and drinking plenty of water may help. ?Watch for new or worsening thoughts of suicide or depression. This includes sudden changes in mood, behaviors, or thoughts. These changes can happen at any time but are more common in the beginning of treatment or after a change in dose. Call your care team right away if you experience these thoughts or worsening depression. ?If you become pregnant while using this medication, you may enroll in the [place]  Antiepileptic Drug Pregnancy Registry by calling [phone]. This registry collects information about the safety of antiepileptic medication use during pregnancy. ?What side effects may I notice from receiving this medication? ?Side effects that you should report to your care team as soon as possible: ?Allergic reactions or angioedema--skin rash, itching, hives, swelling of the face, eyes, lips, tongue, arms, or legs, trouble swallowing or breathing ?Rash, fever, and swollen lymph nodes ?Thoughts of suicide or self harm, worsening mood, feelings of depression ?Trouble breathing ?Unusual changes in mood or behavior in children after use such as difficulty concentrating, hostility, or restlessness ?Side effects  that usually do not require medical attention (report to your care team if they continue or are bothersome): ?Dizziness ?Drowsiness ?Nausea ?Swelling of ankles, feet, or hands ?Vomiting ?This list may not describ

## 2021-08-28 ENCOUNTER — Telehealth: Payer: Self-pay | Admitting: Neurology

## 2021-08-28 NOTE — Telephone Encounter (Signed)
Sent to [facility] ph # [phone] °

## 2021-08-30 DIAGNOSIS — R69 Illness, unspecified: Secondary | ICD-10-CM | POA: Diagnosis not present

## 2021-09-04 ENCOUNTER — Encounter: Payer: Self-pay | Admitting: Neurology

## 2021-09-12 ENCOUNTER — Ambulatory Visit: Payer: 59

## 2021-09-13 DIAGNOSIS — R69 Illness, unspecified: Secondary | ICD-10-CM | POA: Diagnosis not present

## 2021-09-19 ENCOUNTER — Other Ambulatory Visit: Payer: Self-pay | Admitting: Surgery

## 2021-09-19 DIAGNOSIS — K8689 Other specified diseases of pancreas: Secondary | ICD-10-CM

## 2021-09-20 ENCOUNTER — Ambulatory Visit
Admission: RE | Admit: 2021-09-20 | Discharge: 2021-09-20 | Disposition: A | Discharge status: Home / Self Care | Payer: 59 | Source: Ambulatory Visit | Attending: Internal Medicine | Admitting: Internal Medicine

## 2021-09-20 DIAGNOSIS — Z1231 Encounter for screening mammogram for malignant neoplasm of breast: Secondary | ICD-10-CM

## 2021-09-20 IMAGING — MG MM DIGITAL SCREENING BILAT W/ TOMO AND CAD
8 series · 9 of 24 positions shown · non-contrast
Comparison: Previous exam(s).

CLINICAL DATA: Screening.

EXAM:
DIGITAL SCREENING BILATERAL MAMMOGRAM WITH TOMOSYNTHESIS AND CAD
TECHNIQUE: Bilateral screening digital craniocaudal and mediolateral oblique
mammograms were obtained. Bilateral screening digital breast
tomosynthesis was performed. The images were evaluated with
computer-aided detection.

[L MLO synth-2D]
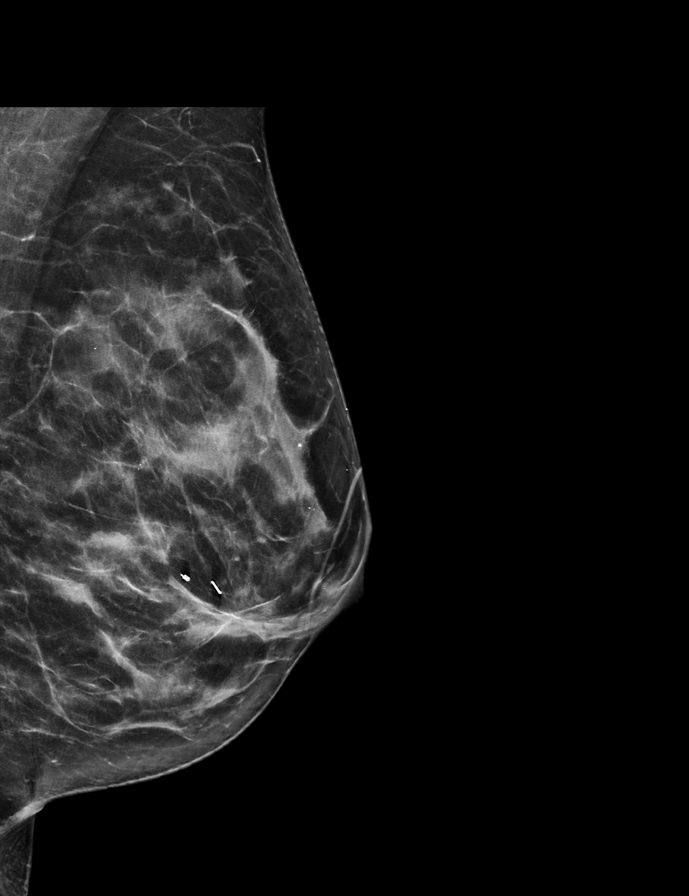

[R CC synth-2D]
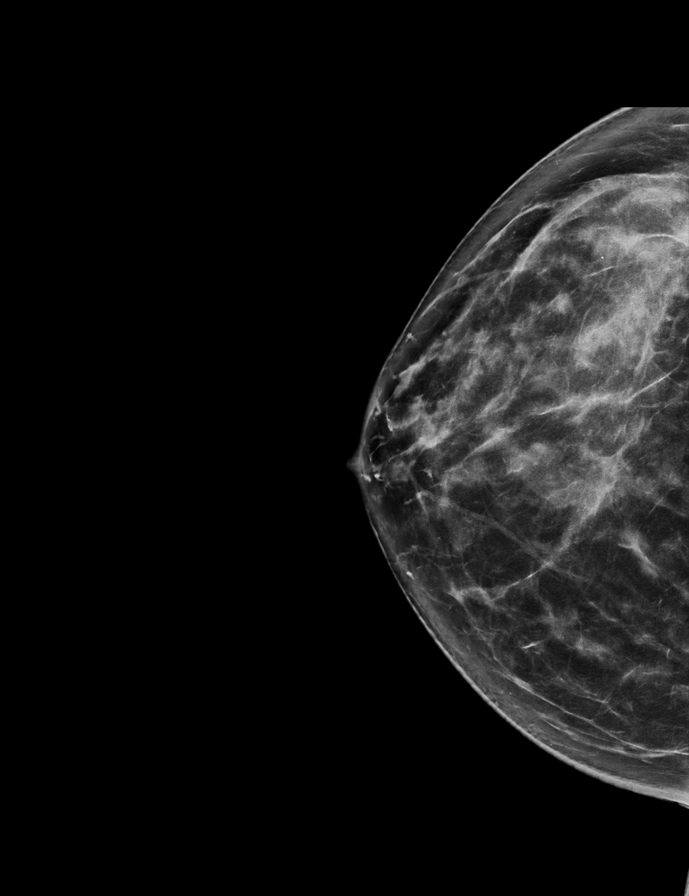

[R MLO synth-2D]
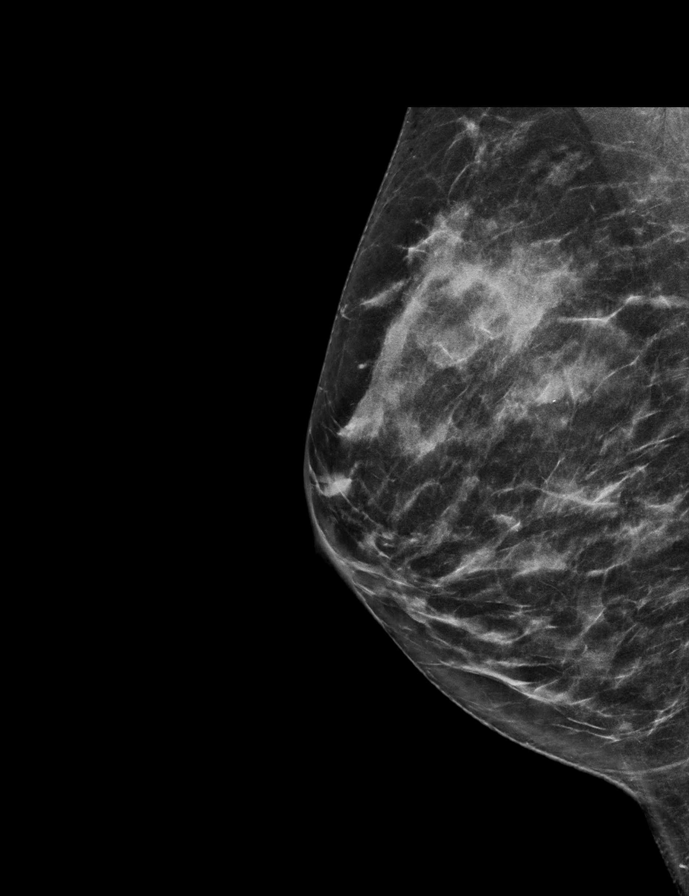

[L CC synth-2D]
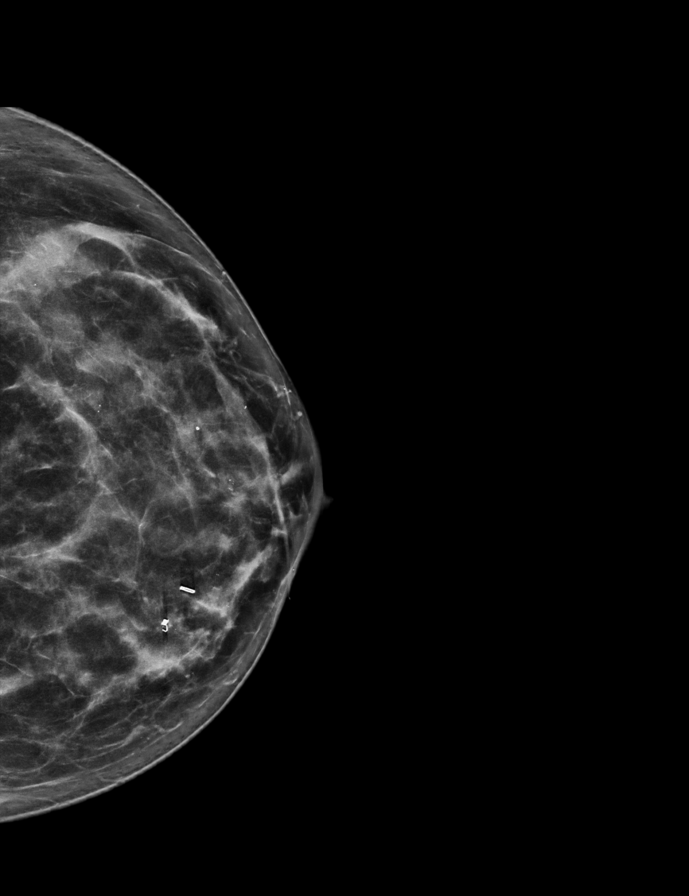

[R MLO tomo · 2 of 64 frames shown]
[frame 21/64]
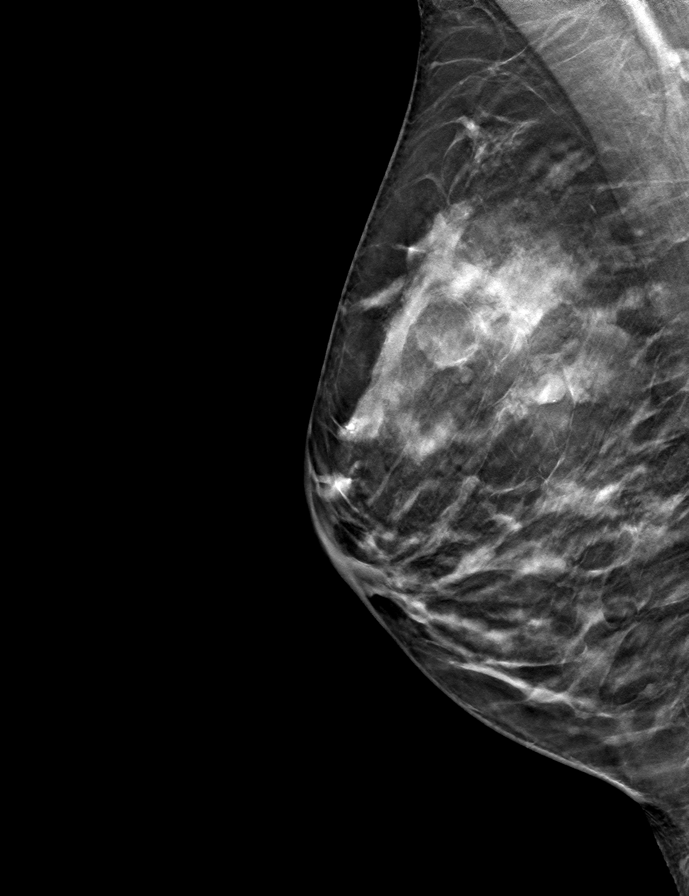
[frame 33/64]
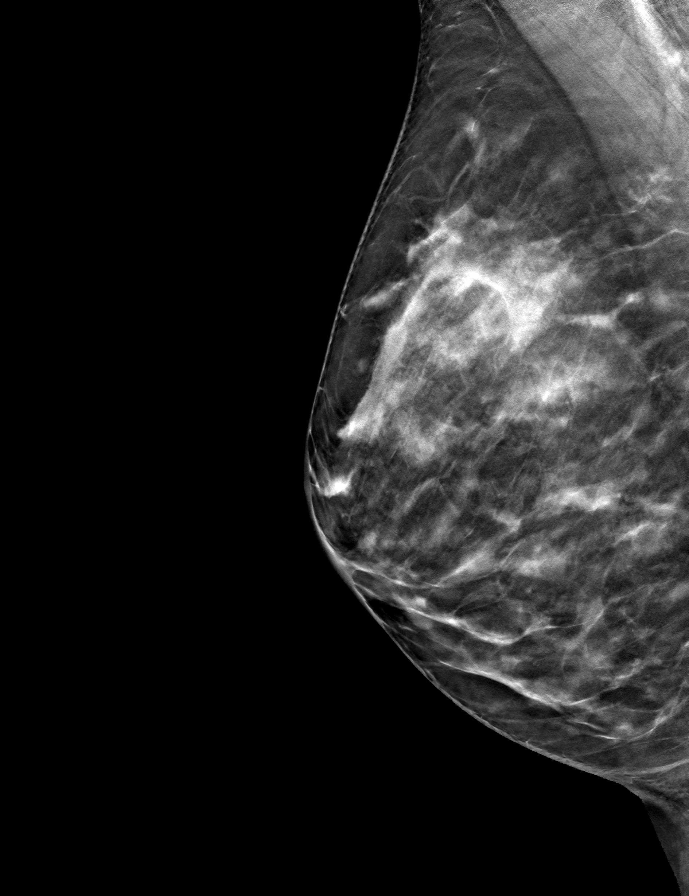

[R CC tomo · tomo slice 35/69.0]
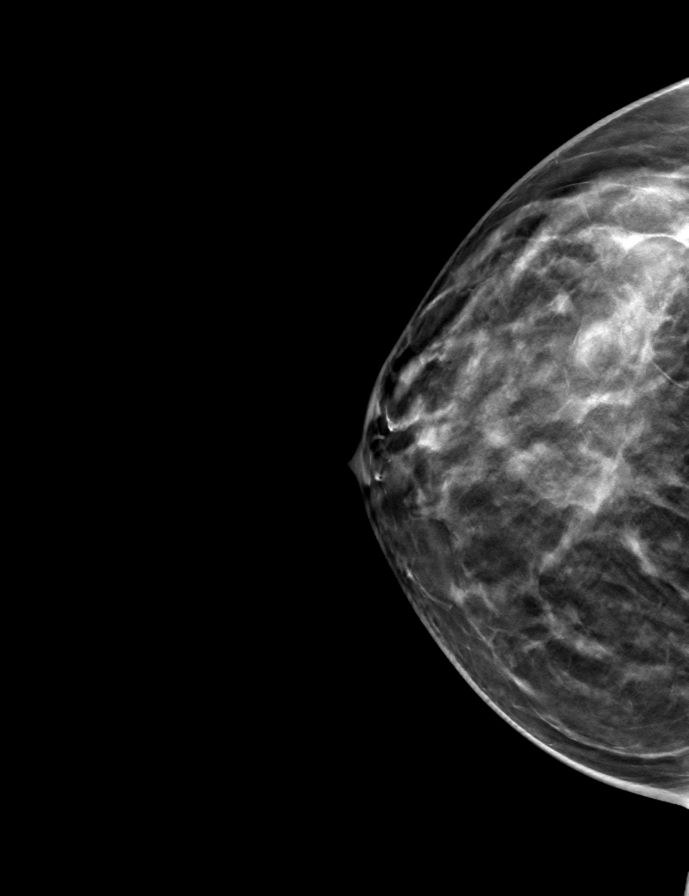

[L CC tomo · tomo slice 33/64.0]
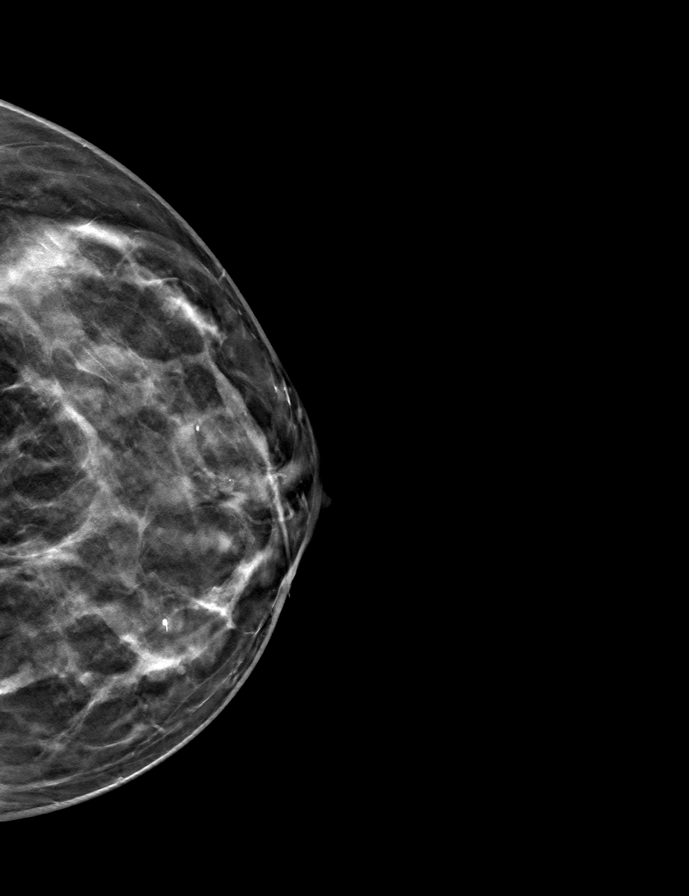

[L MLO tomo · tomo slice 34/67.0]
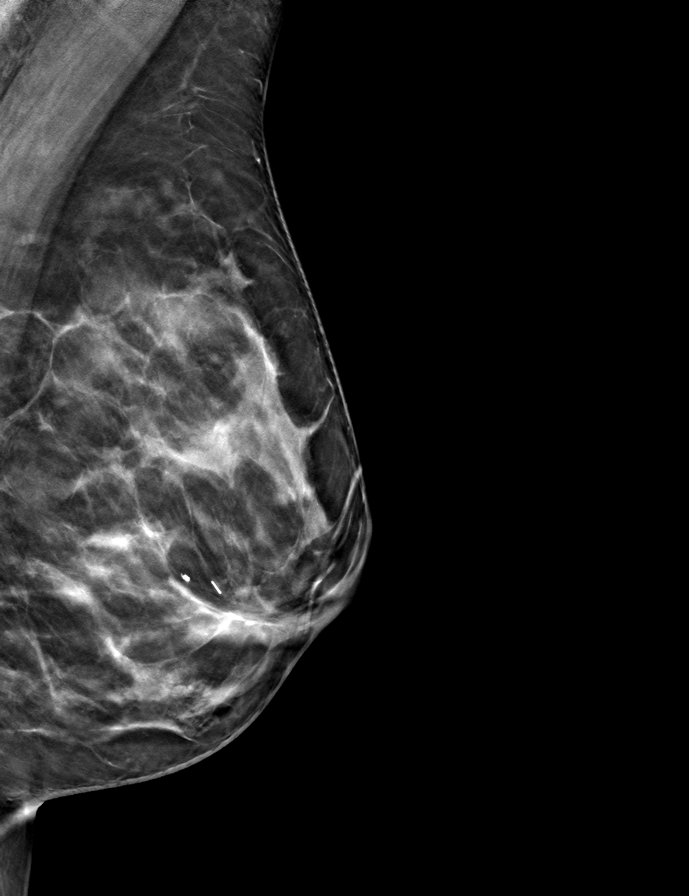

[9 of 24 positions shown; findings below may reference images not displayed]

ACR Breast Density Category c: The breast tissue is heterogeneously
dense, which may obscure small masses.
FINDINGS: There are no findings suspicious for malignancy.
IMPRESSION: No mammographic evidence of malignancy. A result letter of this
screening mammogram will be mailed directly to the patient.

RECOMMENDATION:
Screening mammogram in one year. (Code:[V2])

BI-RADS CATEGORY  1: Negative.

## 2021-09-21 MED ORDER — GABAPENTIN 300 MG PO CAPS: 600.0000 mg | ORAL_CAPSULE | Freq: Three times a day (TID) | ORAL | 10 refills | Status: DC

## 2021-09-21 NOTE — Addendum Note (Signed)
Addended by: Bertram Savin on: 09/21/2021 05:12 PM   Modules accepted: Orders

## 2021-09-25 NOTE — Telephone Encounter (Signed)
Late entry from Dr Lucia Gaskins on 6/8: "Please send in a new prescription for gabapentin 600mg  three times a day to see if that helps with head tremor. And we will see if [facility] can help thanks"

## 2021-10-04 ENCOUNTER — Ambulatory Visit
Admission: RE | Admit: 2021-10-04 | Discharge: 2021-10-04 | Disposition: A | Discharge status: Home / Self Care | Payer: 59 | Source: Ambulatory Visit | Attending: Surgery | Admitting: Surgery

## 2021-10-04 DIAGNOSIS — K8689 Other specified diseases of pancreas: Secondary | ICD-10-CM

## 2021-10-04 IMAGING — MR MR ABDOMEN WO/W CM MRCP
15 of 24 series · 24 of 48 positions shown · IV contrast (12 ml Multihance)
Comparison: MRI abdomen [DATE]

CLINICAL DATA: Pancreatic duct dilatation follow-up

EXAM:
MRI ABDOMEN WITHOUT AND WITH CONTRAST (INCLUDING MRCP)
TECHNIQUE: Multiplanar multisequence MR imaging of the abdomen was performed
both before and after the administration of intravenous contrast.
Heavily T2-weighted images of the biliary and pancreatic ducts were
obtained, and three-dimensional MRCP images were rendered by post
processing.
CONTRAST:  12mL MULTIHANCE GADOBENATE DIMEGLUMINE 529 MG/ML IV SOLN

[Series 4: cor haste · coronal · 5.0mm · 0.66mm/px · 1 of 34 slices shown]
[im 1/34]
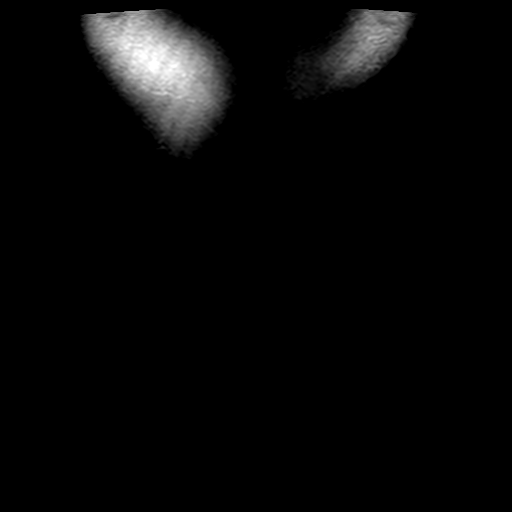

[Series 5: axial haste · axial · 6.0mm · 0.66mm/px · 1 of 38 slices shown]
[im 1/38]
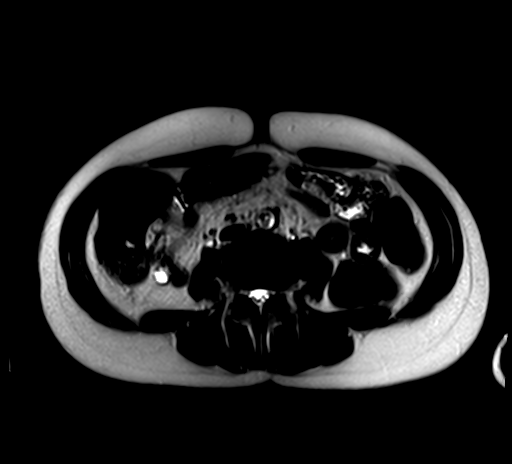

[Series 6: bSSFP · axial · 4.0mm · 0.66mm/px · 1 of 4 slices shown (1 of 3)]
[im 1/4]
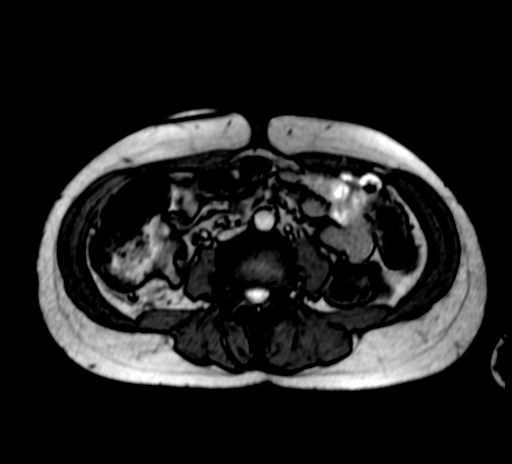

[Series 7: bSSFP · axial · 4.0mm · 0.66mm/px · 1 of 61 slices shown (2 of 3)]
[im 1/61]
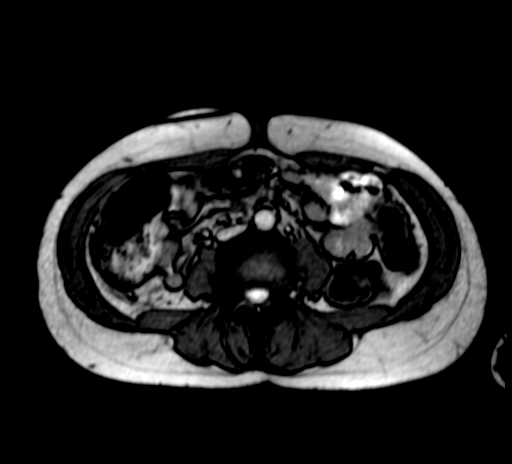

[Series 8: T1 · axial · 6.0mm · 0.74mm/px · 1 of 76 slices shown]
[im 1/76]
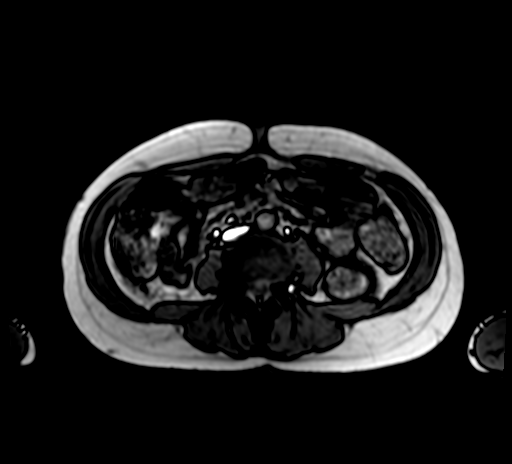

[Series 9: bSSFP · coronal · 5.0mm · 0.66mm/px · 1 of 34 slices shown (3 of 3)]
[im 1/34]
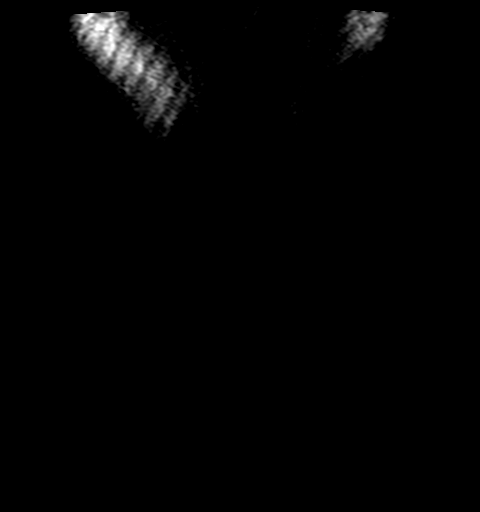

[Series 10: T2 · coronal · 3.0mm · 0.70mm/px · 1 of 56 slices shown]
[im 1/56]
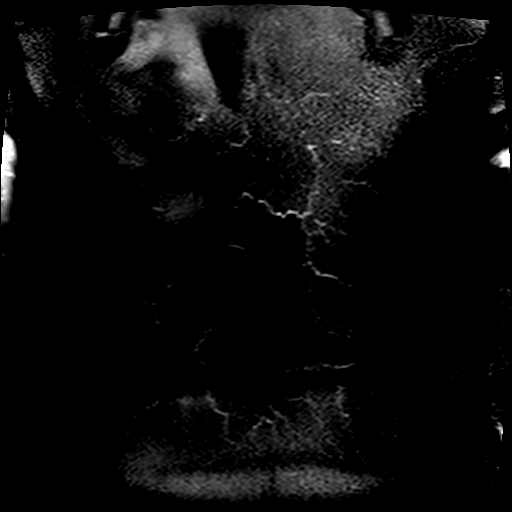

[Series 11: T2 fat-sat · axial · 6.0mm · 1.12mm/px · 1 of 36 slices shown]
[im 1/36]
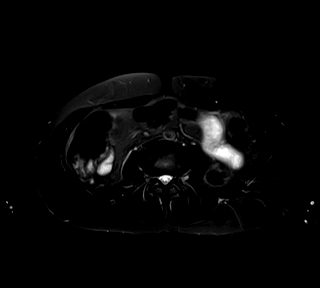

[Series 12: [id number]_trig · axial · 6.0mm · 1.88mm/px · z∈[-123,+129]mm · 2 of 108 slices shown]
[im 1/108]
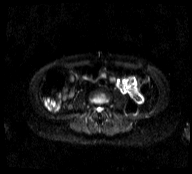
[im 108/108]
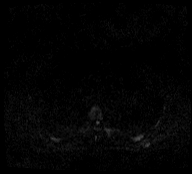

[Series 13: [id number]_trig_adc · axial · 6.0mm · 1.88mm/px · 1 of 36 slices shown]
[im 1/36]
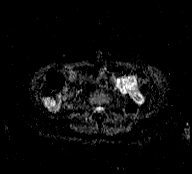

[Series 18: T1 dynamic · axial · non-contrast · 3.0mm · 0.66mm/px · z∈[-129,+132]mm · 3 of 88 slices shown]
[im 1/88]
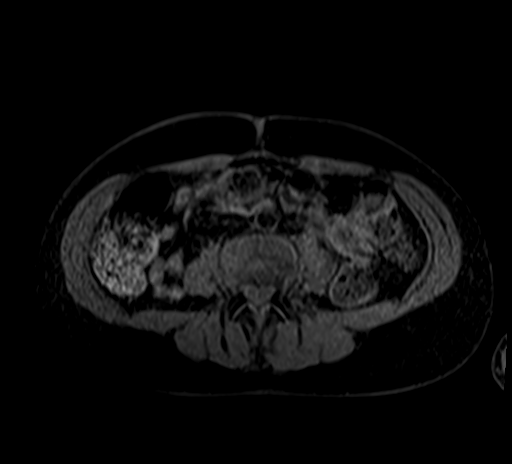
[im 44/88]
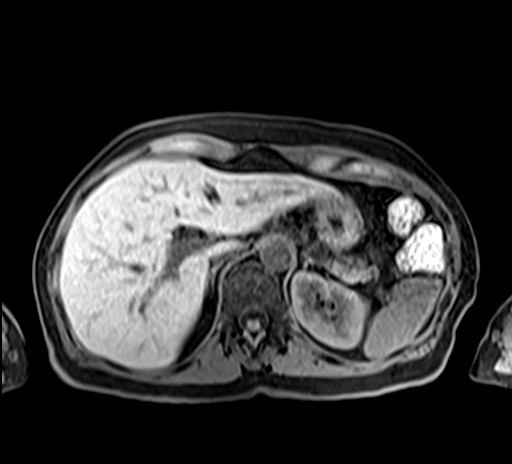
[im 88/88]
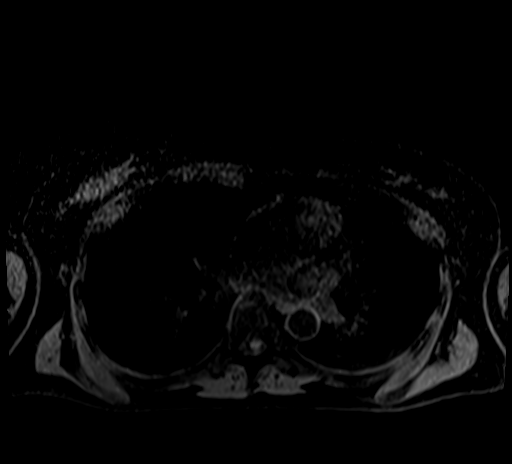

[Series 19: T1 dynamic post-contrast · axial · 3.0mm · 0.66mm/px · z∈[-129,+132]mm · 3 of 88 slices shown (1 of 4)]
[im 1/88]
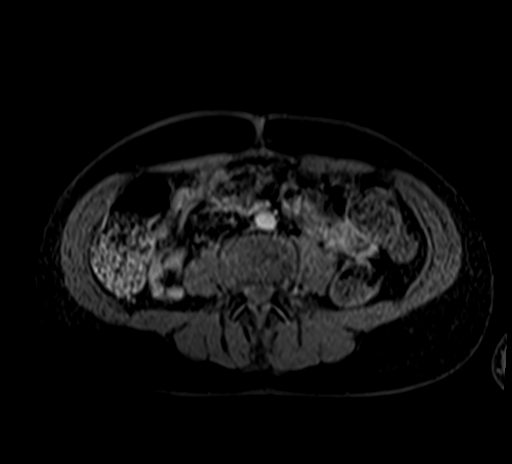
[im 44/88]
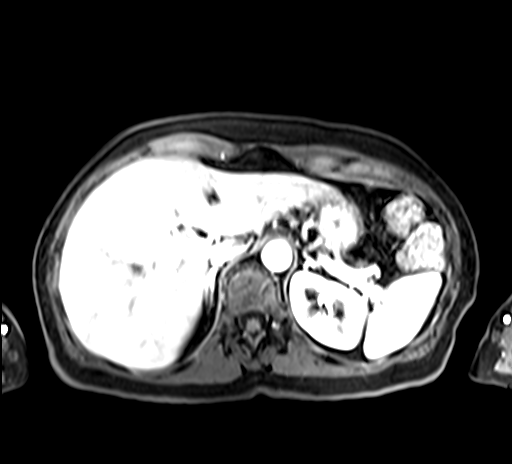
[im 88/88]
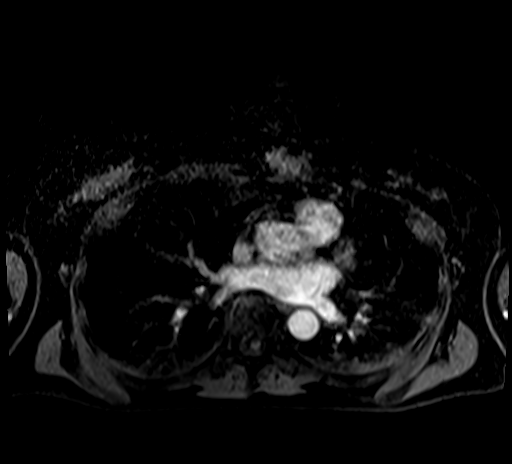

[Series 20: T1 dynamic post-contrast · axial · 3.0mm · 0.66mm/px · z∈[-129,+132]mm · 3 of 88 slices shown (2 of 4)]
[im 1/88]
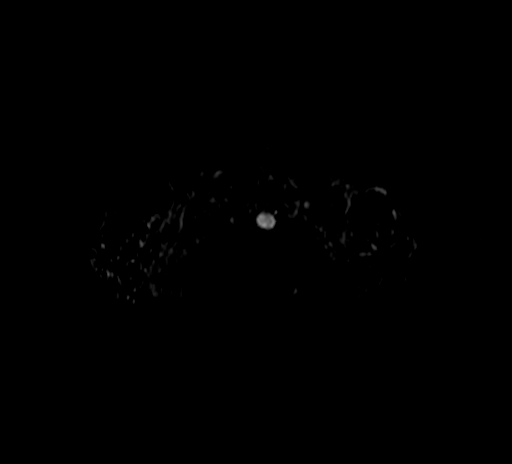
[im 44/88]
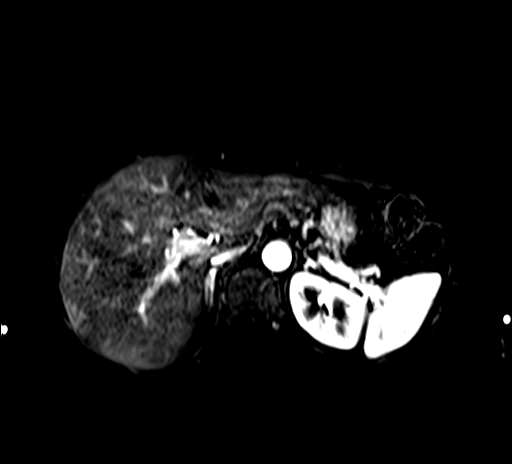
[im 88/88]
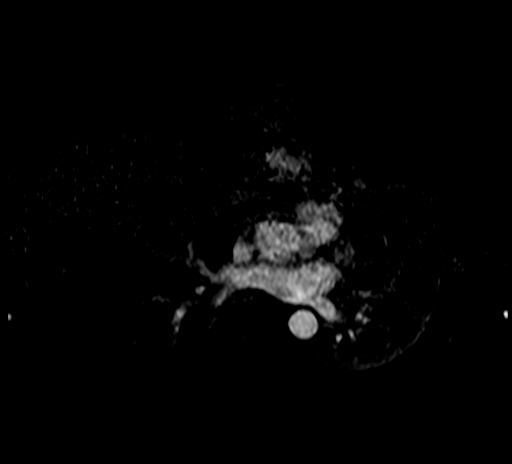

[Series 21: T1 dynamic post-contrast · axial · 3.0mm · 0.66mm/px · z∈[-129,+132]mm · 3 of 88 slices shown (3 of 4)]
[im 1/88]
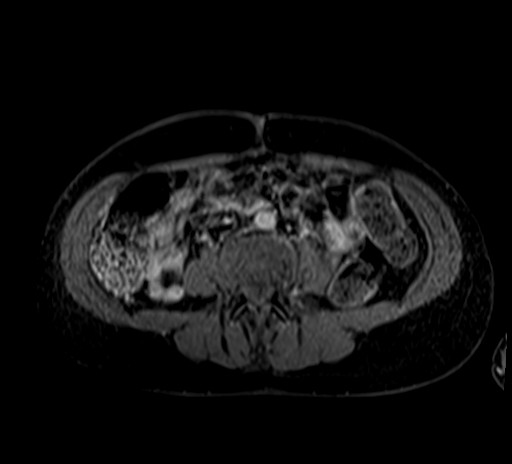
[im 44/88]
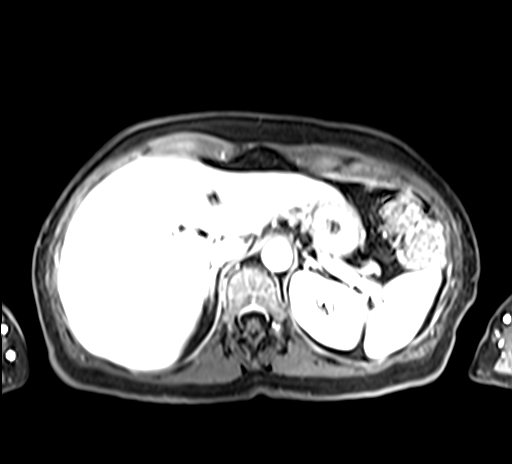
[im 88/88]
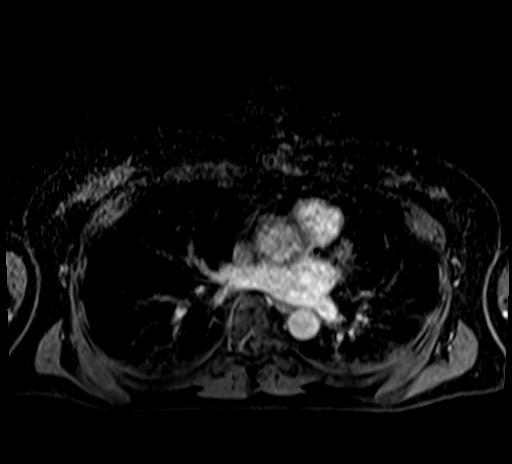

[Series 22: T1 dynamic post-contrast · axial · 3.0mm · 0.66mm/px · 1 of 88 slices shown (4 of 4)]
[im 1/88]
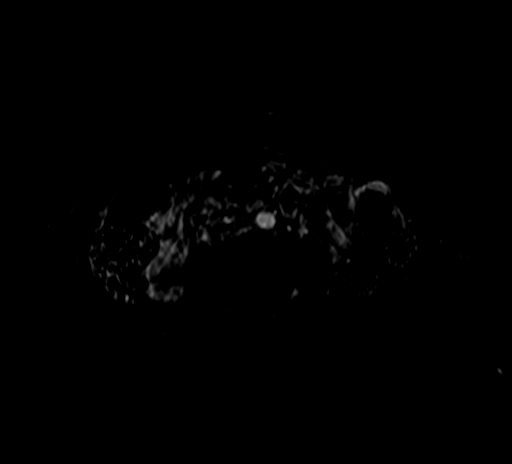

[24 of 48 positions shown; findings below may reference images not displayed]

FINDINGS: Lower chest: Trace right pleural effusion.

Hepatobiliary: Liver is normal in size and contour with no
suspicious mass identified. Tiny subcentimeter cyst in the mid right
hepatic lobe. Gallbladder appears within normal limits. No biliary
ductal dilatation.

Pancreas: Stable diffuse mild pancreatic ductal dilatation measuring
up to 4 mm in the head. No pancreatic mass or inflammatory changes
visualized.

Spleen:  Within normal limits in size and appearance.

Adrenals/Urinary Tract: No masses identified. No evidence of
hydronephrosis.

Stomach/Bowel: Visualized portions within the abdomen are
unremarkable.

Vascular/Lymphatic: No pathologically enlarged lymph nodes
identified. No abdominal aortic aneurysm demonstrated.

Other:  No ascites.

Musculoskeletal: No suspicious bone lesions identified.
IMPRESSION: 1. Stable diffuse mild pancreatic ductal dilatation with no
obstructing lesion identified.
2. Trace right pleural effusion.

## 2021-10-04 MED ORDER — GADOBENATE DIMEGLUMINE 529 MG/ML IV SOLN
12.0000 mL | Freq: Once | INTRAVENOUS | Status: AC | PRN
  Administered 2021-10-04: 12 mL via INTRAVENOUS

## 2021-10-10 ENCOUNTER — Encounter: Payer: Self-pay | Admitting: Physician Assistant

## 2021-10-11 ENCOUNTER — Other Ambulatory Visit: Payer: Self-pay | Admitting: Internal Medicine

## 2021-10-11 DIAGNOSIS — E7800 Pure hypercholesterolemia, unspecified: Secondary | ICD-10-CM | POA: Diagnosis not present

## 2021-10-11 DIAGNOSIS — R69 Illness, unspecified: Secondary | ICD-10-CM | POA: Diagnosis not present

## 2021-10-16 DIAGNOSIS — M25572 Pain in left ankle and joints of left foot: Secondary | ICD-10-CM | POA: Diagnosis not present

## 2021-10-16 DIAGNOSIS — M7672 Peroneal tendinitis, left leg: Secondary | ICD-10-CM | POA: Diagnosis not present

## 2021-10-16 DIAGNOSIS — M79672 Pain in left foot: Secondary | ICD-10-CM | POA: Diagnosis not present

## 2021-10-31 DIAGNOSIS — R69 Illness, unspecified: Secondary | ICD-10-CM | POA: Diagnosis not present

## 2021-11-01 LAB — HM PAP SMEAR

## 2021-11-06 DIAGNOSIS — R300 Dysuria: Secondary | ICD-10-CM | POA: Diagnosis not present
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
## 2021-11-07 ENCOUNTER — Other Ambulatory Visit (INDEPENDENT_AMBULATORY_CARE_PROVIDER_SITE_OTHER): Payer: 59

## 2021-11-07 ENCOUNTER — Encounter: Payer: Self-pay | Admitting: Physician Assistant

## 2021-11-07 ENCOUNTER — Ambulatory Visit (INDEPENDENT_AMBULATORY_CARE_PROVIDER_SITE_OTHER)
Admission: RE | Admit: 2021-11-07 | Discharge: 2021-11-07 | Disposition: A | Discharge status: Home / Self Care | Payer: 59 | Source: Ambulatory Visit | Attending: Physician Assistant | Admitting: Physician Assistant

## 2021-11-07 ENCOUNTER — Ambulatory Visit: Payer: 59 | Admitting: Physician Assistant

## 2021-11-07 VITALS — BP 112/64 | HR 75 | Ht 67.0 in | Wt 129.5 lb

## 2021-11-07 DIAGNOSIS — K581 Irritable bowel syndrome with constipation: Secondary | ICD-10-CM

## 2021-11-07 DIAGNOSIS — K562 Volvulus: Secondary | ICD-10-CM

## 2021-11-07 DIAGNOSIS — Z1211 Encounter for screening for malignant neoplasm of colon: Secondary | ICD-10-CM

## 2021-11-07 DIAGNOSIS — R109 Unspecified abdominal pain: Secondary | ICD-10-CM | POA: Diagnosis not present

## 2021-11-07 LAB — COMPREHENSIVE METABOLIC PANEL
ALT: 14 U/L (ref 0–35)
AST: 19 U/L (ref 0–37)
Albumin: 4.7 g/dL (ref 3.5–5.2)
Alkaline Phosphatase: 45 U/L (ref 39–117)
BUN: 6 mg/dL (ref 6–23)
CO2: 29 mEq/L (ref 19–32)
Calcium: 9.4 mg/dL (ref 8.4–10.5)
Chloride: 103 mEq/L (ref 96–112)
Creatinine, Ser: 0.8 mg/dL (ref 0.40–1.20)
GFR: 84.39 mL/min (ref >60.00)
Glucose, Bld: 98 mg/dL (ref 70–99)
Potassium: 3.9 mEq/L (ref 3.5–5.1)
Sodium: 141 mEq/L (ref 135–145)
Total Bilirubin: 0.5 mg/dL (ref 0.2–1.2)
Total Protein: 6.7 g/dL (ref 6.0–8.3)

## 2021-11-07 LAB — CBC WITH DIFFERENTIAL/PLATELET
Basophils Absolute: 0 10*3/uL (ref 0.0–0.1)
Basophils Relative: 0.8 % (ref 0.0–3.0)
Eosinophils Absolute: 0.1 10*3/uL (ref 0.0–0.7)
Eosinophils Relative: 2.8 % (ref 0.0–5.0)
HCT: 40.8 % (ref 36.0–46.0)
Hemoglobin: 13.8 g/dL (ref 12.0–15.0)
Lymphocytes Relative: 32.6 % (ref 12.0–46.0)
Lymphs Abs: 1.7 10*3/uL (ref 0.7–4.0)
MCHC: 33.9 g/dL (ref 30.0–36.0)
MCV: 94.1 fl (ref 78.0–100.0)
Monocytes Absolute: 0.6 10*3/uL (ref 0.1–1.0)
Monocytes Relative: 11 % (ref 3.0–12.0)
Neutro Abs: 2.7 10*3/uL (ref 1.4–7.7)
Neutrophils Relative %: 52.8 % (ref 43.0–77.0)
Platelets: 251 10*3/uL (ref 150.0–400.0)
RBC: 4.34 Mil/uL (ref 3.87–5.11)
RDW: 13.5 % (ref 11.5–15.5)
WBC: 5.1 10*3/uL (ref 4.0–10.5)

## 2021-11-07 LAB — HIGH SENSITIVITY CRP: CRP, High Sensitivity: 0.23 mg/L (ref 0.000–5.000)

## 2021-11-07 LAB — TSH: TSH: 2.41 u[IU]/mL (ref 0.35–5.50)

## 2021-11-07 NOTE — Progress Notes (Signed)
I agree with the above note, plan 

## 2021-11-07 NOTE — Patient Instructions (Addendum)
If you are age 54 or older, your body mass index should be between 23-30. Your Body mass index is 20.28 kg/m. If this is out of the aforementioned range listed, please consider follow up with your Primary Care Provider.  If you are age 37 or younger, your body mass index should be between 19-25. Your Body mass index is 20.28 kg/m. If this is out of the aformentioned range listed, please consider follow up with your Primary Care Provider.   ________________________________________________________  The [place] [facility] providers would like to encourage you to use [facility] to communicate with providers for non-urgent requests or questions.  Due to long hold times on the telephone, sending your provider a message by [facility] may be a faster and more efficient way to get a response.  Please allow 48 business hours for a response.  Please remember that this is for non-urgent requests.  _______________________________________________________  Your provider has requested that you go to the basement level for lab work before leaving today. Press "B" on the elevator. The lab is located at the first door on the left as you exit the elevator.  Your provider has requested that you have an abdominal x ray before leaving today. Please go to the basement floor to our Radiology department for the test.  Follow up with Dr Christella Hartigan on 01-09-2022 at 950am  We have given you samples of the following medication to take: Linzess 145mg -Can start after the xray  Take at least 30 minutes before the first meal of the day on an empty stomach You can have a loose stool if you eat a high-fat breakfast. Give it at least 4 days, may have more bowel movements during that time.  If you continue to have severe diarrhea try every other day, if you get AB pain with it stop and call our office.  After you are out we can send in a prescription if you did well, there is a prescription card  Recommend increasing water and physical activity.   - Drink at least 64-80 ounces of water/liquid per day. - Establish a time to try to move your bowels every day.  For many people, this is after a cup of coffee or after a meal such as breakfast. - Sit all of the way back on the toilet keeping your back fairly straight and while sitting up, try to rest the tops of your forearms on your upper thighs.   - Raising your feet with a step stool/squatty potty can be helpful to improve the angle that allows your stool to pass through the rectum. - Relax the rectum feeling it bulge toward the toilet water.  If you feel your rectum raising toward your body, you are contracting rather than relaxing. - Breathe in and slowly exhale. "Belly breath" by expanding your belly towards your belly button. Keep belly expanded as you gently direct pressure down and back to the anus.  A low pitched GRRR sound can assist with increasing intra-abdominal pressure.  - Repeat 3-4 times. If unsuccessful, contract the pelvic floor to restore normal tone and get off the toilet.  Avoid excessive straining. - To reduce excessive wiping by teaching your anus to normally contract, place hands on outer aspect of knees and resist knee movement outward.  Hold 5-10 second then place hands just inside of knees and resist inward movement of knees.  Hold 5 seconds.  Repeat a few times each way.  Go to the ER if unable to pass gas, severe AB  pain, unable to hold down food, any shortness of breath of chest pain.   FODMAP stands for fermentable oligo-, di-, mono-saccharides and polyols (1). These are the scientific terms used to classify groups of carbs that are notorious for triggering digestive symptoms like bloating, gas and stomach pain.   We may want to evaluate you for small intestinal bacterial overgrowth, this can cause increase gas, bloating, loose stools or constipation.  There is a test for this we can do or sometimes we will treat a patient with an antibiotic to see if it helps.

## 2021-11-08 DIAGNOSIS — R69 Illness, unspecified: Secondary | ICD-10-CM | POA: Diagnosis not present

## 2021-11-08 LAB — IGA: Immunoglobulin A: 59 mg/dL (ref 47–310)

## 2021-11-08 LAB — TISSUE TRANSGLUTAMINASE, IGA: (tTG) Ab, IgA: <1 U/mL

## 2021-11-09 ENCOUNTER — Encounter: Payer: Self-pay | Admitting: Gastroenterology

## 2021-11-09 NOTE — Telephone Encounter (Signed)
Dawn Jackson this pt saw Marchelle Folks

## 2021-11-22 DIAGNOSIS — R69 Illness, unspecified: Secondary | ICD-10-CM | POA: Diagnosis not present

## 2021-11-24 ENCOUNTER — Other Ambulatory Visit: Payer: Self-pay

## 2021-11-24 MED ORDER — LUBIPROSTONE 8 MCG PO CAPS: 8.0000 ug | ORAL_CAPSULE | Freq: Every day | ORAL | 0 refills | Status: DC

## 2021-11-27 ENCOUNTER — Other Ambulatory Visit (HOSPITAL_COMMUNITY): Payer: Self-pay

## 2021-11-27 ENCOUNTER — Telehealth: Payer: Self-pay | Admitting: Pharmacy Technician

## 2021-11-27 NOTE — Telephone Encounter (Signed)
Patient Advocate Encounter  Received notification from [employer] that prior authorization for LUBIPROSTONE [facility]) is required.   PA submitted on 8.14.23 Key [id number] Status is pending    Ricke Hey, CPhT Patient Advocate Phone: [phone]

## 2021-11-28 ENCOUNTER — Other Ambulatory Visit (HOSPITAL_COMMUNITY): Payer: Self-pay

## 2021-11-30 ENCOUNTER — Other Ambulatory Visit (HOSPITAL_COMMUNITY): Payer: Self-pay

## 2021-11-30 NOTE — Telephone Encounter (Signed)
Patient Advocate Encounter  Prior Authorization for LUBIPROSTONE has been approved.    PA# [id number] Effective dates: 8.17.23 through 12.31.23  Yusuf Yu B. CPhT P: [phone] F: [phone]

## 2021-12-06 DIAGNOSIS — R69 Illness, unspecified: Secondary | ICD-10-CM | POA: Diagnosis not present

## 2021-12-07 ENCOUNTER — Inpatient Hospital Stay: Admission: RE | Admit: 2021-12-07 | Payer: 59 | Source: Ambulatory Visit

## 2021-12-20 DIAGNOSIS — R69 Illness, unspecified: Secondary | ICD-10-CM | POA: Diagnosis not present

## 2021-12-28 ENCOUNTER — Ambulatory Visit
Admission: RE | Admit: 2021-12-28 | Discharge: 2021-12-28 | Disposition: A | Discharge status: Home / Self Care | Payer: No Typology Code available for payment source | Source: Ambulatory Visit | Attending: Internal Medicine | Admitting: Internal Medicine

## 2021-12-28 DIAGNOSIS — E7800 Pure hypercholesterolemia, unspecified: Secondary | ICD-10-CM

## 2022-01-03 DIAGNOSIS — R69 Illness, unspecified: Secondary | ICD-10-CM | POA: Diagnosis not present

## 2022-01-05 ENCOUNTER — Other Ambulatory Visit: Payer: Self-pay | Admitting: Physician Assistant

## 2022-01-09 ENCOUNTER — Ambulatory Visit: Payer: 59 | Admitting: Gastroenterology

## 2022-01-16 DIAGNOSIS — Z0000 Encounter for general adult medical examination without abnormal findings: Secondary | ICD-10-CM | POA: Diagnosis not present

## 2022-01-16 DIAGNOSIS — R7303 Prediabetes: Secondary | ICD-10-CM | POA: Diagnosis not present

## 2022-01-17 DIAGNOSIS — R69 Illness, unspecified: Secondary | ICD-10-CM | POA: Diagnosis not present

## 2022-01-25 ENCOUNTER — Other Ambulatory Visit: Payer: Self-pay | Admitting: Obstetrics and Gynecology

## 2022-01-25 DIAGNOSIS — N6002 Solitary cyst of left breast: Secondary | ICD-10-CM

## 2022-01-29 ENCOUNTER — Ambulatory Visit: Payer: 59 | Admitting: Physical Therapy

## 2022-01-31 DIAGNOSIS — R69 Illness, unspecified: Secondary | ICD-10-CM | POA: Diagnosis not present

## 2022-02-05 ENCOUNTER — Other Ambulatory Visit: Payer: Self-pay

## 2022-02-05 ENCOUNTER — Ambulatory Visit: Payer: 59 | Attending: Urology | Admitting: Physical Therapy

## 2022-02-05 DIAGNOSIS — R293 Abnormal posture: Secondary | ICD-10-CM | POA: Diagnosis not present

## 2022-02-05 DIAGNOSIS — R279 Unspecified lack of coordination: Secondary | ICD-10-CM | POA: Insufficient documentation

## 2022-02-05 DIAGNOSIS — M6281 Muscle weakness (generalized): Secondary | ICD-10-CM | POA: Diagnosis not present

## 2022-02-05 DIAGNOSIS — R300 Dysuria: Secondary | ICD-10-CM | POA: Insufficient documentation

## 2022-02-05 NOTE — Patient Instructions (Addendum)
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
## 2022-02-07 ENCOUNTER — Other Ambulatory Visit: Payer: Self-pay | Admitting: Obstetrics and Gynecology

## 2022-02-07 ENCOUNTER — Ambulatory Visit
Admission: RE | Admit: 2022-02-07 | Discharge: 2022-02-07 | Disposition: A | Discharge status: Home / Self Care | Payer: 59 | Source: Ambulatory Visit | Attending: Obstetrics and Gynecology | Admitting: Obstetrics and Gynecology

## 2022-02-07 ENCOUNTER — Ambulatory Visit: Payer: 59 | Admitting: Gastroenterology

## 2022-02-07 DIAGNOSIS — N6002 Solitary cyst of left breast: Secondary | ICD-10-CM

## 2022-02-07 DIAGNOSIS — N6320 Unspecified lump in the left breast, unspecified quadrant: Secondary | ICD-10-CM

## 2022-02-07 DIAGNOSIS — R92332 Mammographic heterogeneous density, left breast: Secondary | ICD-10-CM | POA: Diagnosis not present

## 2022-02-07 DIAGNOSIS — R69 Illness, unspecified: Secondary | ICD-10-CM | POA: Diagnosis not present

## 2022-02-07 DIAGNOSIS — N6489 Other specified disorders of breast: Secondary | ICD-10-CM | POA: Diagnosis not present

## 2022-02-13 ENCOUNTER — Ambulatory Visit
Admission: RE | Admit: 2022-02-13 | Discharge: 2022-02-13 | Disposition: A | Discharge status: Home / Self Care | Payer: 59 | Source: Ambulatory Visit | Attending: Obstetrics and Gynecology | Admitting: Obstetrics and Gynecology

## 2022-02-13 DIAGNOSIS — N6320 Unspecified lump in the left breast, unspecified quadrant: Secondary | ICD-10-CM

## 2022-02-13 DIAGNOSIS — N6012 Diffuse cystic mastopathy of left breast: Secondary | ICD-10-CM | POA: Diagnosis not present

## 2022-02-13 DIAGNOSIS — N6324 Unspecified lump in the left breast, lower inner quadrant: Secondary | ICD-10-CM | POA: Diagnosis not present

## 2022-02-13 HISTORY — PX: BREAST BIOPSY: SHX20

## 2022-02-14 DIAGNOSIS — R69 Illness, unspecified: Secondary | ICD-10-CM | POA: Diagnosis not present

## 2022-02-21 DIAGNOSIS — R69 Illness, unspecified: Secondary | ICD-10-CM | POA: Diagnosis not present

## 2022-02-28 DIAGNOSIS — R69 Illness, unspecified: Secondary | ICD-10-CM | POA: Diagnosis not present

## 2022-03-06 DIAGNOSIS — R69 Illness, unspecified: Secondary | ICD-10-CM | POA: Diagnosis not present

## 2022-03-21 DIAGNOSIS — R69 Illness, unspecified: Secondary | ICD-10-CM | POA: Diagnosis not present

## 2022-03-28 DIAGNOSIS — R69 Illness, unspecified: Secondary | ICD-10-CM | POA: Diagnosis not present

## 2022-04-02 DIAGNOSIS — R69 Illness, unspecified: Secondary | ICD-10-CM | POA: Diagnosis not present

## 2022-04-06 DIAGNOSIS — G243 Spasmodic torticollis: Secondary | ICD-10-CM | POA: Insufficient documentation

## 2022-04-17 DIAGNOSIS — R69 Illness, unspecified: Secondary | ICD-10-CM | POA: Diagnosis not present

## 2022-04-18 ENCOUNTER — Telehealth: Payer: Self-pay

## 2022-04-18 ENCOUNTER — Encounter: Payer: Self-pay | Admitting: Internal Medicine

## 2022-04-18 ENCOUNTER — Ambulatory Visit (INDEPENDENT_AMBULATORY_CARE_PROVIDER_SITE_OTHER): Payer: 59 | Admitting: Internal Medicine

## 2022-04-18 VITALS — BP 110/68 | HR 86 | Temp 97.9°F | Ht 67.0 in | Wt 122.0 lb

## 2022-04-18 DIAGNOSIS — M85852 Other specified disorders of bone density and structure, left thigh: Secondary | ICD-10-CM | POA: Diagnosis not present

## 2022-04-18 DIAGNOSIS — M85851 Other specified disorders of bone density and structure, right thigh: Secondary | ICD-10-CM

## 2022-04-18 DIAGNOSIS — K581 Irritable bowel syndrome with constipation: Secondary | ICD-10-CM | POA: Insufficient documentation

## 2022-04-18 LAB — BASIC METABOLIC PANEL
BUN: 7 mg/dL (ref 6–23)
CO2: 26 mEq/L (ref 19–32)
Calcium: 9.3 mg/dL (ref 8.4–10.5)
Chloride: 103 mEq/L (ref 96–112)
Creatinine, Ser: 0.77 mg/dL (ref 0.40–1.20)
GFR: 88.08 mL/min (ref >60.00)
Glucose, Bld: 111 mg/dL — ABNORMAL HIGH (ref 70–99)
Potassium: 4.3 mEq/L (ref 3.5–5.1)
Sodium: 137 mEq/L (ref 135–145)

## 2022-04-18 LAB — VITAMIN D 25 HYDROXY (VIT D DEFICIENCY, FRACTURES): VITD: 69.79 ng/mL (ref 30.00–100.00)

## 2022-04-18 MED ORDER — TRULANCE 3 MG PO TABS: 1.0000 | ORAL_TABLET | Freq: Every day | ORAL | 1 refills | Status: DC

## 2022-04-18 NOTE — Progress Notes (Signed)
Subjective:  Patient ID: Dawn Jackson, female    DOB: [birth year]  Age: 52 y.o. MRN: [id number]  CC: Constipation   HPI Dawn Jackson presents for establishing.  She complains of chronic constipation.  Says she only has a bowel movement every other day.  She has a history of volvulus that was surgically repaired.  She has not tolerated Amitiza or Linzess.  She denies abdominal pain, nausea, vomiting, or diarrhea.   History Dawn Jackson has a past medical history of Alcoholism ([place]), Allergy, Anxiety, Black stool (2017), Chronic headaches, Depression, DVT (deep venous thrombosis) ([place]) (2017), Nasal septum perforation, Orthostatic hypertension, PTSD (post-traumatic stress disorder), and Substance abuse ([place]).   She has a past surgical history that includes Wisdom tooth extraction; Breast biopsy (Left, x2); Breast excisional biopsy (Left, 2005); Adenoidectomy (1973); laparotomy (N/A, 10/26/2020); Partial colectomy (Right, 10/26/2020); Colonoscopy (12/2018); and Breast biopsy (Left, 02/13/2022).   Her family history includes Breast cancer (age of onset: 55) in her paternal grandmother; COPD in her maternal grandfather; Cancer in her mother and paternal grandmother; Cervical cancer in her maternal grandmother; Colon polyps in her mother; Heart disease in her father; Migraines in her mother.She reports that she quit smoking about 15 years ago. Her smoking use included cigarettes. She has a 6.00 pack-year smoking history. She has never used smokeless tobacco. She reports that she does not currently use alcohol. She reports that she does not currently use drugs after having used the following drugs: Cocaine.  Outpatient Medications Prior to Visit  Medication Sig Dispense Refill   DULoxetine (CYMBALTA) 60 MG capsule Take 120 mg by mouth in the morning.     gabapentin (NEURONTIN) 300 MG capsule Take 2 capsules (600 mg total) by mouth 3 (three) times daily. (Patient taking differently: Take 300 mg by mouth 2  (two) times daily. Take 3 capsules in the morning and take 3 capsules in the evening.) 180 capsule 10   hydrOXYzine (VISTARIL) 50 MG capsule Take 50 mg by mouth as needed.     ibuprofen (ADVIL) 800 MG tablet Take 800 mg by mouth daily as needed.     rizatriptan (MAXALT-MLT) 10 MG disintegrating tablet Take 1 tablet (10 mg total) by mouth as needed for migraine. May repeat in 2 hours if needed 9 tablet 11   traZODone (DESYREL) 100 MG tablet Take 100 mg by mouth at bedtime.     lubiprostone (AMITIZA) 8 MCG capsule TAKE 1 CAPSULE BY MOUTH DAILY (Patient not taking: Reported on 02/05/2022) 30 capsule 0   primidone (MYSOLINE) 50 MG tablet Take 5 tablets (250 mg total) by mouth at bedtime. 450 tablet 3   No facility-administered medications prior to visit.    ROS Review of Systems  Constitutional: Negative.  Negative for chills, diaphoresis, fatigue and fever.  HENT: Negative.    Eyes: Negative.   Respiratory:  Negative for cough, chest tightness, shortness of breath and wheezing.   Cardiovascular:  Negative for chest pain, palpitations and leg swelling.  Gastrointestinal:  Positive for constipation. Negative for abdominal pain, diarrhea, nausea and vomiting.  Endocrine: Negative.   Genitourinary: Negative.   Musculoskeletal: Negative.  Negative for arthralgias and myalgias.  Skin: Negative.   Neurological:  Positive for tremors. Negative for dizziness, weakness and numbness.  Hematological:  Negative for adenopathy. Does not bruise/bleed easily.  Psychiatric/Behavioral:  Positive for decreased concentration and dysphoric mood. Negative for confusion, sleep disturbance and suicidal ideas. The patient is not nervous/anxious.     Objective:  BP 110/68 (BP  Location: Left Arm, Patient Position: Sitting, Cuff Size: Normal)   Pulse 86   Temp 97.9 F (36.6 C) (Oral)   Ht 5\' 7"  (1.702 m)   Wt 122 lb (55.3 kg)   SpO2 98%   BMI 19.11 kg/m   Physical Exam Vitals reviewed.  Constitutional:       Appearance: She is not ill-appearing.  HENT:     Nose: Nose normal.     Mouth/Throat:     Mouth: Mucous membranes are moist.  Eyes:     General: No scleral icterus.    Conjunctiva/sclera: Conjunctivae normal.  Cardiovascular:     Rate and Rhythm: Normal rate and regular rhythm.     Heart sounds: No murmur heard. Pulmonary:     Effort: Pulmonary effort is normal.     Breath sounds: No stridor. No wheezing, rhonchi or rales.  Abdominal:     General: Abdomen is scaphoid. Bowel sounds are normal. There is no distension.     Palpations: Abdomen is soft. There is no hepatomegaly, splenomegaly or mass.     Tenderness: There is no abdominal tenderness. There is no guarding or rebound.     Hernia: No hernia is present.  Musculoskeletal:        General: Normal range of motion.     Cervical back: Neck supple.     Right lower leg: No edema.     Left lower leg: No edema.  Lymphadenopathy:     Cervical: No cervical adenopathy.  Skin:    General: Skin is warm and dry.  Neurological:     General: No focal deficit present.     Mental Status: She is alert.     Motor: Tremor (head) present.  Psychiatric:        Mood and Affect: Mood normal.        Behavior: Behavior normal.     Lab Results  Component Value Date   WBC 5.1 11/07/2021   HGB 13.8 11/07/2021   HCT 40.8 11/07/2021   PLT 251.0 11/07/2021   GLUCOSE 111 (H) 04/18/2022   CHOL 179 01/10/2018   TRIG 134 01/10/2018   HDL 49 01/10/2018   LDLCALC 103 (H) 01/10/2018   ALT 14 11/07/2021   AST 19 11/07/2021   NA 137 04/18/2022   K 4.3 04/18/2022   CL 103 04/18/2022   CREATININE 0.77 04/18/2022   BUN 7 04/18/2022   CO2 26 04/18/2022   TSH 2.41 11/07/2021   HGBA1C 5.0 10/12/2019      Assessment & Plan:   Dawn Jackson was seen today for constipation.  Diagnoses and all orders for this visit:  Irritable bowel syndrome with constipation- I recommended that she try plecanatide. -     Plecanatide (TRULANCE) 3 MG TABS; Take 1  tablet by mouth daily. -     Basic metabolic panel; Future -     Basic metabolic panel  Osteopenia of both hips- She is due for a 2-year follow-up. -     VITAMIN D 25 Hydroxy (Vit-D Deficiency, Fractures); Future -     Basic metabolic panel; Future -     Basic metabolic panel -     VITAMIN D 25 Hydroxy (Vit-D Deficiency, Fractures) -     DG Bone Density; Future   I have discontinued Kalanie M. Lusk's primidone and lubiprostone. I am also having her start on Trulance. Additionally, I am having her maintain her DULoxetine, hydrOXYzine, ibuprofen, rizatriptan, gabapentin, and traZODone.  Meds ordered this encounter  Medications  Plecanatide (TRULANCE) 3 MG TABS    Sig: Take 1 tablet by mouth daily.    Dispense:  90 tablet    Refill:  1     Follow-up: Return in about 6 months (around 10/17/2022).  Scarlette Calico, MD

## 2022-04-18 NOTE — Telephone Encounter (Signed)
Key: [id number]

## 2022-04-18 NOTE — Patient Instructions (Signed)
Irritable Bowel Syndrome, Adult  Irritable bowel syndrome (IBS) is a group of symptoms that affects the organs responsible for digestion (gastrointestinal tract, or GI tract). IBS is not one specific disease. To regulate how the GI tract works, the body sends signals back and forth between the intestines and the brain. If you have IBS, there may be a problem with these signals. As a result, the GI tract does not function normally. The intestines may become more sensitive and overreact to certain things. This may be especially true when you eat certain foods or when you are under stress. There are four main types of IBS. These may be determined based on the consistency of your stool (feces): IBS with mostly (predominance of) diarrhea. IBS with predominance of constipation. IBS with mixed bowel habits. This includes both diarrhea and constipation. IBS unclassified. This includes IBS that cannot be categorized into one of the other three main types. It is important to know which type of IBS you have. Certain treatments are more likely to be helpful for certain types of IBS. What are the causes? The exact cause of IBS is not known. What increases the risk? You may have a higher risk for IBS if you: Are female. Are younger than 40 years. Have a family history of IBS. Have a mental health condition, such as depression, anxiety, or post-traumatic stress disorder. Have had a bacterial infection of your GI tract. What are the signs or symptoms? Symptoms of IBS vary from person to person. The main symptom is abdominal pain or discomfort. Other symptoms usually include one or more of the following: Diarrhea, constipation, or both. Swelling or bloating in the abdomen. Feeling full after eating a small or regular-sized meal. Frequent gas. Mucus in the stool. A feeling of having more stool left after a bowel movement. Symptoms tend to come and go. They may be triggered by stress, mental health  conditions, or certain foods. How is this diagnosed? This condition may be diagnosed based on a physical exam, your medical history, and your symptoms. You may have tests, such as: Blood tests. Stool test. Colonoscopy. This is a procedure in which your GI tract is viewed with a long, thin, flexible tube. How is this treated? There is no cure for IBS, but treatment can help relieve symptoms. Treatment depends on the type of IBS you have, and may include: Changes to your diet, such as: Avoiding foods that cause symptoms. Drinking more water. Following a low-FODMAP (fermentable oligosaccharides, disaccharides, monosaccharides, and polyols) diet for up to 6 weeks, or as told by your health care provider. FODMAPs are sugars that are hard for some people to digest. Eating more fiber. Eating small meals at the same times every day. Medicines. These may include: Fiber supplements, if you have constipation. Medicine to control diarrhea (antidiarrheal medicines). Medicine to help control muscle tightening (spasms) in your GI tract (antispasmodic medicines). Medicines to help with mental health conditions, such as antidepressants. Talk therapy or counseling. Working with a dietitian to help create a food plan that is right for you. Managing your stress. Follow these instructions at home: Eating and drinking  Eat a healthy diet. Eat 5-6 small meals a day. Try to eat meals at about the same times each day. Do not eat large meals. Gradually eat more fiber-rich foods. These include whole grains, fruits, and vegetables. This may be especially helpful if you have IBS with constipation. Eat a diet low in FODMAPs. You may need to avoid foods such as   citrus fruits, cabbage, garlic, and onions. Drink enough fluid to keep your urine pale yellow. Keep a journal of foods that seem to trigger symptoms. Avoid foods and drinks that: Contain added sugar. Make your symptoms worse. These may include dairy  products, caffeinated drinks, and carbonated drinks. Alcohol use Do not drink alcohol if: Your health care provider tells you not to drink. You are pregnant, may be pregnant, or are planning to become pregnant. If you drink alcohol: Limit how much you have to: 0-1 drink a day for women. 0-2 drinks a day for men. Know how much alcohol is in your drink. In the [place] one drink equals one 12 oz bottle of beer (355 mL), one 5 oz glass of wine (148 mL), or one 1 oz glass of hard liquor (44 mL) General instructions Take over-the-counter and prescription medicines only as told by your health care provider. This includes supplements. Get enough exercise. Do at least 150 minutes of moderate-intensity exercise each week. Manage your stress. Getting enough sleep and exercise can help you manage stress. Keep all follow-up visits. This is important. This includes all visits with your health care provider and therapist. Where to find more information International Foundation for Functional Gastrointestinal Disorders: aboutibs.org National Institute of Diabetes and Digestive and Kidney Diseases: niddk.nih.gov Contact a health care provider if: You have constant pain. You lose weight. You have diarrhea that gets worse. You have bleeding from the rectum. You vomit often. You have a fever. Get help right away if: You have severe abdominal pain. You have diarrhea with symptoms of dehydration, such as dizziness or dry mouth. You have bloody or black stools. You have severe abdominal bloating. You have vomiting that does not stop. You have blood in your vomit. Summary Irritable bowel syndrome (IBS) is not one specific disease. It is a group of symptoms that affects digestion. Your intestines may become more sensitive and overreact to certain things. This may be especially true when you eat certain foods or when you are under stress. There is no cure for IBS, but treatment can help relieve  symptoms. This information is not intended to replace advice given to you by your health care provider. Make sure you discuss any questions you have with your health care provider. Document Revised: 03/15/2021 Document Reviewed: 03/15/2021 Elsevier Patient Education  2023 Elsevier Inc.  

## 2022-04-20 ENCOUNTER — Encounter: Payer: Self-pay | Admitting: Internal Medicine

## 2022-04-20 DIAGNOSIS — Z79890 Hormone replacement therapy (postmenopausal): Secondary | ICD-10-CM | POA: Diagnosis not present

## 2022-04-20 DIAGNOSIS — F324 Major depressive disorder, single episode, in partial remission: Secondary | ICD-10-CM | POA: Diagnosis not present

## 2022-04-20 DIAGNOSIS — Z780 Asymptomatic menopausal state: Secondary | ICD-10-CM | POA: Diagnosis not present

## 2022-04-20 DIAGNOSIS — K581 Irritable bowel syndrome with constipation: Secondary | ICD-10-CM | POA: Diagnosis not present

## 2022-04-20 DIAGNOSIS — G250 Essential tremor: Secondary | ICD-10-CM | POA: Diagnosis not present

## 2022-04-20 DIAGNOSIS — Z791 Long term (current) use of non-steroidal anti-inflammatories (NSAID): Secondary | ICD-10-CM | POA: Diagnosis not present

## 2022-04-20 DIAGNOSIS — Z87891 Personal history of nicotine dependence: Secondary | ICD-10-CM | POA: Diagnosis not present

## 2022-04-20 DIAGNOSIS — G4700 Insomnia, unspecified: Secondary | ICD-10-CM | POA: Diagnosis not present

## 2022-04-20 DIAGNOSIS — M1990 Unspecified osteoarthritis, unspecified site: Secondary | ICD-10-CM | POA: Diagnosis not present

## 2022-04-20 DIAGNOSIS — R69 Illness, unspecified: Secondary | ICD-10-CM | POA: Diagnosis not present

## 2022-04-20 DIAGNOSIS — Z803 Family history of malignant neoplasm of breast: Secondary | ICD-10-CM | POA: Diagnosis not present

## 2022-04-20 NOTE — Telephone Encounter (Signed)
Pt states that pharmacy has denied her [employer], is requesting that we start an appeal for her.

## 2022-04-20 NOTE — Telephone Encounter (Signed)
PA denied  Reason given:  Pt does not meet the medical criteria.  Formulary alternative(s) are lubiprostone, Linzess. Requirement: 3 in a class with 3 or more alternatives, 2 in a class with 2 alternatives, or 1 in a class with only 1 alternative. Please refer to your plan documents for a complete list of alternatives

## 2022-04-23 ENCOUNTER — Other Ambulatory Visit (HOSPITAL_COMMUNITY): Payer: Self-pay

## 2022-04-25 DIAGNOSIS — R69 Illness, unspecified: Secondary | ICD-10-CM | POA: Diagnosis not present

## 2022-04-25 NOTE — Telephone Encounter (Signed)
Submitted new PA with chart notes showing trial/failure of Amitiza and Linzess.  Patient Advocate Encounter   Received notification from [facility] that prior authorization for Trulance 3mg  tabs is required.   PA submitted on 04/25/2022 Key [id number] Status is pending

## 2022-04-27 ENCOUNTER — Telehealth: Payer: Self-pay | Admitting: Internal Medicine

## 2022-04-27 ENCOUNTER — Other Ambulatory Visit (HOSPITAL_COMMUNITY): Payer: Self-pay

## 2022-04-27 NOTE — Telephone Encounter (Signed)
Patient said he can not afford the trulance - she would like to be prescribed Linzess. I told patient she should check with her insurance to see what they recommend as to a drug that they cover.  Patient did not want to check with them   Please send the linzess to pharmacy on record.  Patients phone # [phone]

## 2022-04-27 NOTE — Telephone Encounter (Signed)
Patient Advocate Encounter  Prior Authorization for Trulance 3mg  has been approved.    PA# PA Case ID: [id number] Effective dates: 04/26/22 through 04/27/2023  Placed a call to the pharmacy to notify of the approval.

## 2022-04-30 ENCOUNTER — Other Ambulatory Visit (HOSPITAL_COMMUNITY): Payer: Self-pay

## 2022-04-30 ENCOUNTER — Telehealth: Payer: Self-pay | Admitting: Neurology

## 2022-04-30 ENCOUNTER — Other Ambulatory Visit: Payer: Self-pay | Admitting: Internal Medicine

## 2022-04-30 DIAGNOSIS — K581 Irritable bowel syndrome with constipation: Secondary | ICD-10-CM

## 2022-04-30 MED ORDER — RIZATRIPTAN BENZOATE 10 MG PO TBDP: 10.0000 mg | ORAL_TABLET | ORAL | 0 refills | Status: DC | PRN

## 2022-04-30 MED ORDER — LINACLOTIDE 72 MCG PO CAPS: 72.0000 ug | ORAL_CAPSULE | Freq: Every day | ORAL | 1 refills | Status: DC

## 2022-04-30 NOTE — Telephone Encounter (Signed)
Pt has been informed that Rx was sent and instructed to take on an empty stomach. Pt expressed understanding.

## 2022-04-30 NOTE — Telephone Encounter (Signed)
Pt is calling. Asking if Dr. Jaynee Eagles will give her a prescription for  rizatriptan (MAXALT-MLT) 10 MG disintegrating tablet. Stated she is still waiting to get into [facility] at the end of February.

## 2022-04-30 NOTE — Telephone Encounter (Signed)
One refill sent to pharmacy

## 2022-05-02 DIAGNOSIS — R69 Illness, unspecified: Secondary | ICD-10-CM | POA: Diagnosis not present

## 2022-05-09 DIAGNOSIS — R69 Illness, unspecified: Secondary | ICD-10-CM | POA: Diagnosis not present

## 2022-05-15 DIAGNOSIS — R69 Illness, unspecified: Secondary | ICD-10-CM | POA: Diagnosis not present

## 2022-05-30 DIAGNOSIS — R69 Illness, unspecified: Secondary | ICD-10-CM | POA: Diagnosis not present

## 2022-06-06 DIAGNOSIS — F4389 Other reactions to severe stress: Secondary | ICD-10-CM | POA: Diagnosis not present

## 2022-06-06 DIAGNOSIS — G243 Spasmodic torticollis: Secondary | ICD-10-CM | POA: Diagnosis not present

## 2022-06-08 ENCOUNTER — Ambulatory Visit: Payer: 59 | Admitting: Gastroenterology

## 2022-06-08 ENCOUNTER — Encounter: Payer: Self-pay | Admitting: Gastroenterology

## 2022-06-08 VITALS — BP 100/56 | HR 62 | Ht 67.0 in | Wt 120.0 lb

## 2022-06-08 DIAGNOSIS — Z800 Family history of malignant neoplasm of digestive organs: Secondary | ICD-10-CM

## 2022-06-08 DIAGNOSIS — K5904 Chronic idiopathic constipation: Secondary | ICD-10-CM

## 2022-06-08 DIAGNOSIS — K562 Volvulus: Secondary | ICD-10-CM | POA: Diagnosis not present

## 2022-06-08 DIAGNOSIS — N8110 Cystocele, unspecified: Secondary | ICD-10-CM

## 2022-06-08 DIAGNOSIS — K581 Irritable bowel syndrome with constipation: Secondary | ICD-10-CM | POA: Diagnosis not present

## 2022-06-08 NOTE — Patient Instructions (Signed)
A high fiber diet with plenty of fluids (up to 8 glasses of water daily) is suggested to relieve these symptoms.  Fiber Con , 1 tablespoon once daily can be used to keep bowels regular if needed.  We have given you samples of the following medication to take: Orpah Cobb - take 1 pill once daily.   We have also given you a sample of Trulance and savings card, you can decide which medication works best for you and let us know.   Toileting tips to help with your constipation - Drink at least 64-80 ounces of water/liquid per day. - Establish a time to try to move your bowels every day.  For many people, this is after a cup of coffee or after a meal such as breakfast. - Sit all of the way back on the toilet keeping your back fairly straight and while sitting up, try to rest the tops of your forearms on your upper thighs.   - Raising your feet with a step stool/squatty potty can be helpful to improve the angle that allows your stool to pass through the rectum. - Relax the rectum feeling it bulge toward the toilet water.  If you feel your rectum raising toward your body, you are contracting rather than relaxing. - Breathe in and slowly exhale. "Belly breath" by expanding your belly towards your belly button. Keep belly expanded as you gently direct pressure down and back to the anus.  A low pitched GRRR sound can assist with increasing intra-abdominal pressure.  - Repeat 3-4 times. If unsuccessful, contract the pelvic floor to restore normal tone and get off the toilet.  Avoid excessive straining. - To reduce excessive wiping by teaching your anus to normally contract, place hands on outer aspect of knees and resist knee movement outward.  Hold 5-10 second then place hands just inside of knees and resist inward movement of knees.  Hold 5 seconds.  Repeat a few times each way.  Follow up  in 3-4 months. Office will contact you to schedule.   Thank you for choosing me and [facility].  Dr.  Rush Landmark

## 2022-06-08 NOTE — Progress Notes (Unsigned)
[facility]   Primary Care Provider Janith Lima, MD [place] [address] [phone]  Referring Provider Deland Pretty, MD [address] [place],  [place] [address] [phone]  Patient Profile: Dawn Jackson is a 52 y.o. female with a pmh significant for  The patient presents to the [facility] for an evaluation and management of problem(s) noted below:  Problem List No diagnosis found.  History of Present Illness    The patient does/does not take NSAIDs or BC/Goody Powder. Patient has/has not had an EGD. Patient has/has not had a Colonoscopy.  GI Review of Systems Positive as above Negative for  Pyrosis; Reflux; Regurgitation; Dysphagia; Odynophagia; Globus; Post-prandial cough; Nocturnal cough; Nasal regurgitation; Epigastric pain; Nausea; Vomiting; Hematemesis; Jaundice; Change in Appetite; Early satiety; Abdominal pain; Abdominal bloating; Eructation; Flatulence; Change in BM Frequency; Change in BM Consistency; Constipation; Diarrhea; Incontinence; Urgency; Tenesmus; Hematochezia; Melena  Review of Systems General: Denies fevers/chills/weight loss/night sweats HEENT: Denies oral lesions/sore throat/headaches/visual changes Cardiovascular: Denies chest pain/palpitations Pulmonary: Denies shortness of breath/cough Gastroenterological: See HPI Genitourinary: Denies darkened urine or hematuria Hematological: Denies easy bruising/bleeding Endocrine: Denies temperature intolerance Dermatological: Denies skin changes Psychological: Mood is stable Allergy & Immunology: Denies severe allergic reactions Musculoskeletal: Denies new arthralgias   Medications Current Outpatient Medications  Medication Sig Dispense Refill   DULoxetine (CYMBALTA) 60 MG capsule Take 120 mg by mouth in the morning.     estradiol (ESTRACE) 1 MG tablet Take 1 mg by mouth daily.     gabapentin  (NEURONTIN) 300 MG capsule Take 2 capsules (600 mg total) by mouth 3 (three) times daily. (Patient taking differently: Take 300 mg by mouth 2 (two) times daily. Take 3 capsules in the morning and take 3 capsules in the evening.) 180 capsule 10   hydrOXYzine (VISTARIL) 50 MG capsule Take 50 mg by mouth as needed.     ibuprofen (ADVIL) 800 MG tablet Take 800 mg by mouth daily as needed.     rizatriptan (MAXALT-MLT) 10 MG disintegrating tablet Take 1 tablet (10 mg total) by mouth as needed for migraine. May repeat in 2 hours if needed 9 tablet 0   traZODone (DESYREL) 100 MG tablet Take 100 mg by mouth at bedtime.     No current facility-administered medications for this visit.    Allergies Allergies  Allergen Reactions   Amitiza [Lubiprostone] Other (See Comments)    Abd pain   Linzess [Linaclotide] Other (See Comments)    Abd cramps    Histories Past Medical History:  Diagnosis Date   Alcoholism ([place])    Allergy    Anxiety    Black stool 2017   Chronic headaches    Depression    DVT (deep venous thrombosis) ([place]) 2017   LLE, "resolved"   Nasal septum perforation    Orthostatic hypertension    PTSD (post-traumatic stress disorder)    Substance abuse ([place])    Past Surgical History:  Procedure Laterality Date   ADENOIDECTOMY  1973   BREAST BIOPSY Left x2   BREAST BIOPSY Left 02/13/2022   [place] LT BREAST BX W LOC DEV 1ST LESION IMG BX [place] US GUIDE 02/13/2022 [facility]   BREAST EXCISIONAL BIOPSY Left 2005   "only one was surgical I think"   COLONOSCOPY  12/2018   Dr Terressa Koyanagi it was good   LAPAROTOMY N/A 10/26/2020   Procedure: EXPLORATORY LAPAROTOMY;  Surgeon: Clovis Riley, MD;  Location: [facility];  Service: General;  Laterality: N/A;   PARTIAL COLECTOMY Right 10/26/2020   Procedure: PARTIAL COLECTOMY, ILEOCECECTOMY;  Surgeon: Clovis Riley, MD;  Location: [facility];  Service: General;  Laterality: Right;   WISDOM TOOTH EXTRACTION     Social History    Socioeconomic History   Marital status: Widowed    Spouse name: Not on file   Number of children: 1   Years of education: Not on file   Highest education level: Master's degree (e.g., MA, MS, MEng, MEd, MSW, MBA)  Occupational History   Not on file  Tobacco Use   Smoking status: Former    Packs/day: 1.00    Years: 6.00    Total pack years: 6.00    Types: Cigarettes    Quit date: 2009    Years since quitting: 15.1   Smokeless tobacco: Never  Vaping Use   Vaping Use: Never used  Substance and Sexual Activity   Alcohol use: Not Currently    Comment: Recovering alcoholic; quit [id number]   Drug use: Not Currently    Types: Cocaine    Comment: quit 10/2014   Sexual activity: Not Currently    Partners: Male  Other Topics Concern   Not on file  Social History Narrative   Lives at home with husband   Right handed   Caffeine: 2 cups/day   Social Determinants of Health   Financial Resource Strain: Not on file  Food Insecurity: Not on file  Transportation Needs: Not on file  Physical Activity: Not on file  Stress: Not on file  Social Connections: Not on file  Intimate Partner Violence: Not on file   Family History  Problem Relation Age of Onset   Cancer Mother        lung, non small cell carcinoma    Migraines Mother        "like once a year when we were little"   Colon polyps Mother    Heart disease Father    Cervical cancer Maternal Grandmother    COPD Maternal Grandfather    Cancer Paternal Grandmother    Breast cancer Paternal Grandmother 50   Tremor Neg Hx    I have reviewed her medical, social, and family history in detail and updated the electronic medical record as necessary.    PHYSICAL EXAMINATION  Ht '5\' 7"'$  (1.702 m)   Wt 120 lb (54.4 kg)   BMI 18.79 kg/m  Wt Readings from Last 3 Encounters:  06/08/22 120 lb (54.4 kg)  04/18/22 122 lb (55.3 kg)  11/07/21 129 lb 8 oz (58.7 kg)   GEN: NAD, appears stated age, doesn't appear chronically ill PSYCH:  Cooperative, without pressured speech EYE: Conjunctivae pink, sclerae anicteric ENT: MMM, without oral ulcers, no erythema or exudates noted NECK: Supple CV: RR without R/Gs  RESP: CTAB posteriorly, without wheezing GI: NABS, soft, NT/ND, without rebound or guarding, no HSM appreciated GU: DRE shows MSK/EXT: _ edema, no palmar erythema SKIN: No jaundice, no spider angiomata, no concerning rashes NEURO:  Alert & Oriented x 3, no focal deficits, no evidence of asterixis   REVIEW OF DATA  I reviewed the following data at the time of this encounter:  GI Procedures and Studies  ***  Laboratory Studies  ***  Imaging Studies  ***   ASSESSMENT  Ms. Eib is a 52 y.o. female with a pmh significant for The patient is seen today for evaluation and management of:  No diagnosis found.  ***   PLAN  There  are no diagnoses linked to this encounter.   No orders of the defined types were placed in this encounter.   New Prescriptions   No medications on file   Modified Medications   No medications on file    Planned Follow Up No follow-ups on file.   Total Time in Face-to-Face and in Coordination of Care for patient including independent/personal interpretation/review of prior testing, medical history, examination, medication adjustment, communicating results with the patient directly, and documentation within the EHR is ***.   Justice Britain, MD [facility] Office # [id number]

## 2022-06-09 ENCOUNTER — Encounter: Payer: Self-pay | Admitting: Gastroenterology

## 2022-06-09 DIAGNOSIS — N8110 Cystocele, unspecified: Secondary | ICD-10-CM | POA: Insufficient documentation

## 2022-06-09 DIAGNOSIS — K5904 Chronic idiopathic constipation: Secondary | ICD-10-CM | POA: Insufficient documentation

## 2022-06-09 DIAGNOSIS — Z800 Family history of malignant neoplasm of digestive organs: Secondary | ICD-10-CM | POA: Insufficient documentation

## 2022-06-11 DIAGNOSIS — F4389 Other reactions to severe stress: Secondary | ICD-10-CM | POA: Diagnosis not present

## 2022-06-13 DIAGNOSIS — F4389 Other reactions to severe stress: Secondary | ICD-10-CM | POA: Diagnosis not present

## 2022-06-20 ENCOUNTER — Encounter: Payer: Self-pay | Admitting: Gastroenterology

## 2022-06-20 DIAGNOSIS — F4389 Other reactions to severe stress: Secondary | ICD-10-CM | POA: Diagnosis not present

## 2022-06-20 NOTE — Progress Notes (Signed)
Outside records to be scanned into the chart  October 2023 clinic visit Established patient here to describe issues Patient prescribed vaginal Valium per report patient on replacement therapy and dealing with issues with libido   July 2023 clinic visit for OB/GYN patient dealing with burning while urinating as well as urgency and frequency Refer to urologist and also asked to take Azo to help with burning and pain sensation  Pathology report from a breast biopsy October 2023 [place] adenosis in the background of fibrocystic changes including fibrosis and apical metaplasia Negative for malignancy

## 2022-06-20 NOTE — Progress Notes (Signed)
Review of [facility] outside records to be scanned into the chart  September 2023 office visit patient is describing issues of voiding dysfunction and pelvic pain which has been present for few months.  Has a prior history of UTIs.  She was experiencing dysuria.  She was having to strain and bear down to start her urinary stream as well as incomplete bladder emptying.  She is having nocturia as well as constipation.  She was given meloxicam as well as Valium set up for PT/OT referral.

## 2022-06-27 DIAGNOSIS — F4323 Adjustment disorder with mixed anxiety and depressed mood: Secondary | ICD-10-CM | POA: Diagnosis not present

## 2022-07-04 DIAGNOSIS — F4389 Other reactions to severe stress: Secondary | ICD-10-CM | POA: Diagnosis not present

## 2022-07-05 ENCOUNTER — Telehealth: Payer: Self-pay

## 2022-07-05 MED ORDER — TRULANCE 3 MG PO TABS: 3.0000 mg | ORAL_TABLET | Freq: Every day | ORAL | 2 refills | Status: DC

## 2022-07-05 NOTE — Telephone Encounter (Signed)
Patient sent my chart message requesting 90 day supply of Trulance be sent to pharmacy.

## 2022-07-16 DIAGNOSIS — F4389 Other reactions to severe stress: Secondary | ICD-10-CM | POA: Diagnosis not present

## 2022-07-24 DIAGNOSIS — R300 Dysuria: Secondary | ICD-10-CM | POA: Diagnosis not present

## 2022-07-24 DIAGNOSIS — N3000 Acute cystitis without hematuria: Secondary | ICD-10-CM | POA: Diagnosis not present

## 2022-07-25 DIAGNOSIS — F4389 Other reactions to severe stress: Secondary | ICD-10-CM | POA: Diagnosis not present

## 2022-08-01 DIAGNOSIS — F4389 Other reactions to severe stress: Secondary | ICD-10-CM | POA: Diagnosis not present

## 2022-08-07 ENCOUNTER — Other Ambulatory Visit: Payer: Self-pay | Admitting: Obstetrics and Gynecology

## 2022-08-07 DIAGNOSIS — Z0000 Encounter for general adult medical examination without abnormal findings: Secondary | ICD-10-CM

## 2022-08-08 DIAGNOSIS — F4389 Other reactions to severe stress: Secondary | ICD-10-CM | POA: Diagnosis not present

## 2022-08-13 DIAGNOSIS — G243 Spasmodic torticollis: Secondary | ICD-10-CM | POA: Diagnosis not present

## 2022-08-15 DIAGNOSIS — F4389 Other reactions to severe stress: Secondary | ICD-10-CM | POA: Diagnosis not present

## 2022-08-18 DIAGNOSIS — M5090 Cervical disc disorder, unspecified, unspecified cervical region: Secondary | ICD-10-CM | POA: Insufficient documentation

## 2022-08-18 DIAGNOSIS — G8929 Other chronic pain: Secondary | ICD-10-CM | POA: Insufficient documentation

## 2022-08-22 DIAGNOSIS — F4389 Other reactions to severe stress: Secondary | ICD-10-CM | POA: Diagnosis not present

## 2022-08-22 DIAGNOSIS — G243 Spasmodic torticollis: Secondary | ICD-10-CM | POA: Diagnosis not present

## 2022-08-23 ENCOUNTER — Other Ambulatory Visit: Payer: Self-pay

## 2022-08-29 DIAGNOSIS — F4389 Other reactions to severe stress: Secondary | ICD-10-CM | POA: Diagnosis not present

## 2022-09-24 ENCOUNTER — Ambulatory Visit
Admission: RE | Admit: 2022-09-24 | Discharge: 2022-09-24 | Disposition: A | Discharge status: Home / Self Care | Payer: 59 | Source: Ambulatory Visit | Attending: Obstetrics and Gynecology | Admitting: Obstetrics and Gynecology

## 2022-09-24 DIAGNOSIS — Z0000 Encounter for general adult medical examination without abnormal findings: Secondary | ICD-10-CM

## 2022-09-24 DIAGNOSIS — Z1231 Encounter for screening mammogram for malignant neoplasm of breast: Secondary | ICD-10-CM | POA: Diagnosis not present

## 2022-09-25 DIAGNOSIS — F4389 Other reactions to severe stress: Secondary | ICD-10-CM | POA: Diagnosis not present

## 2022-10-04 DIAGNOSIS — F4389 Other reactions to severe stress: Secondary | ICD-10-CM | POA: Diagnosis not present

## 2022-10-10 DIAGNOSIS — F4389 Other reactions to severe stress: Secondary | ICD-10-CM | POA: Diagnosis not present

## 2022-10-17 DIAGNOSIS — F4389 Other reactions to severe stress: Secondary | ICD-10-CM | POA: Diagnosis not present

## 2022-10-23 DIAGNOSIS — R309 Painful micturition, unspecified: Secondary | ICD-10-CM | POA: Diagnosis not present

## 2022-10-23 DIAGNOSIS — R8271 Bacteriuria: Secondary | ICD-10-CM | POA: Diagnosis not present

## 2022-10-30 DIAGNOSIS — F4389 Other reactions to severe stress: Secondary | ICD-10-CM | POA: Diagnosis not present

## 2022-11-09 DIAGNOSIS — F332 Major depressive disorder, recurrent severe without psychotic features: Secondary | ICD-10-CM | POA: Diagnosis not present

## 2022-11-09 DIAGNOSIS — F1910 Other psychoactive substance abuse, uncomplicated: Secondary | ICD-10-CM | POA: Diagnosis not present

## 2022-11-09 DIAGNOSIS — F411 Generalized anxiety disorder: Secondary | ICD-10-CM | POA: Diagnosis not present

## 2022-11-12 DIAGNOSIS — G243 Spasmodic torticollis: Secondary | ICD-10-CM | POA: Diagnosis not present

## 2022-11-13 ENCOUNTER — Encounter: Payer: Self-pay | Admitting: Neurology

## 2022-11-14 DIAGNOSIS — F4389 Other reactions to severe stress: Secondary | ICD-10-CM | POA: Diagnosis not present

## 2022-11-19 ENCOUNTER — Encounter: Payer: Self-pay | Admitting: Internal Medicine

## 2022-11-19 ENCOUNTER — Ambulatory Visit (INDEPENDENT_AMBULATORY_CARE_PROVIDER_SITE_OTHER): Payer: 59

## 2022-11-19 ENCOUNTER — Encounter (HOSPITAL_COMMUNITY): Payer: Self-pay

## 2022-11-19 ENCOUNTER — Ambulatory Visit (HOSPITAL_COMMUNITY)
Admission: EM | Admit: 2022-11-19 | Discharge: 2022-11-19 | Disposition: A | Discharge status: Home / Self Care | Payer: 59 | Attending: Physician Assistant | Admitting: Physician Assistant

## 2022-11-19 ENCOUNTER — Telehealth (HOSPITAL_COMMUNITY): Payer: Self-pay

## 2022-11-19 DIAGNOSIS — M5030 Other cervical disc degeneration, unspecified cervical region: Secondary | ICD-10-CM | POA: Diagnosis not present

## 2022-11-19 DIAGNOSIS — M542 Cervicalgia: Secondary | ICD-10-CM | POA: Diagnosis not present

## 2022-11-19 DIAGNOSIS — M47814 Spondylosis without myelopathy or radiculopathy, thoracic region: Secondary | ICD-10-CM | POA: Diagnosis not present

## 2022-11-19 DIAGNOSIS — M50222 Other cervical disc displacement at C5-C6 level: Secondary | ICD-10-CM | POA: Diagnosis not present

## 2022-11-19 DIAGNOSIS — G243 Spasmodic torticollis: Secondary | ICD-10-CM | POA: Diagnosis not present

## 2022-11-19 DIAGNOSIS — M47812 Spondylosis without myelopathy or radiculopathy, cervical region: Secondary | ICD-10-CM | POA: Diagnosis not present

## 2022-11-19 MED ORDER — HYDROCODONE-ACETAMINOPHEN 5-325 MG PO TABS: 2.0000 | ORAL_TABLET | Freq: Three times a day (TID) | ORAL | 0 refills | Status: DC | PRN

## 2022-11-19 NOTE — Discharge Instructions (Signed)
Your x-ray did show degenerative changes including a slight slipping back of the vertebrae around the area that you are having pain.  I do recommend that you follow-up with an orthopedic provider as you may benefit from an MRI for further evaluation and management.  I have called in a few doses of hydrocodone.  Unfortunately we are very limited in the medications that can be given.  We will not be able to provide you any refill of this medicine so follow-up with your primary care or neurologist as soon as possible.  This is sedating and addictive so use this as infrequently as possible.  Do not drive or drink alcohol while taking it.  You can use over-the-counter medications including Tylenol ibuprofen as needed.  If you have any worsening symptoms including increasing pain, fever, weakness or numbness in your arms you need to be seen immediately.

## 2022-11-19 NOTE — Telephone Encounter (Signed)
Pt called requesting to have an alternative rx sent to a new pharmacy. Pt states she received a text from the pharmacy. Request the rx [facility] to [facility] [address].
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
## 2022-11-19 NOTE — ED Triage Notes (Signed)
Pt presents with c/o neck pain x 3 days after her Botox injection. States she has put on a heating pad.

## 2022-11-20 ENCOUNTER — Telehealth (HOSPITAL_COMMUNITY): Payer: Self-pay | Admitting: Nurse Practitioner

## 2022-11-20 MED ORDER — HYDROCODONE-ACETAMINOPHEN 5-325 MG PO TABS: 2.0000 | ORAL_TABLET | Freq: Three times a day (TID) | ORAL | 0 refills | Status: AC | PRN

## 2022-11-20 NOTE — Telephone Encounter (Signed)
[facility] of [place], PDMP reviewed, appropriate and prescription resent to [employer] [facility].

## 2022-11-21 DIAGNOSIS — M542 Cervicalgia: Secondary | ICD-10-CM | POA: Diagnosis not present

## 2022-11-26 ENCOUNTER — Ambulatory Visit: Payer: 59 | Admitting: Internal Medicine

## 2022-11-27 DIAGNOSIS — F4389 Other reactions to severe stress: Secondary | ICD-10-CM | POA: Diagnosis not present

## 2022-12-10 DIAGNOSIS — F4389 Other reactions to severe stress: Secondary | ICD-10-CM | POA: Diagnosis not present

## 2022-12-14 DIAGNOSIS — M542 Cervicalgia: Secondary | ICD-10-CM | POA: Diagnosis not present

## 2022-12-14 NOTE — Progress Notes (Unsigned)
Assessment/Plan:   Cervical Dystonia  -I talked to the patient about the nature and pathophysiology.  The patient is having trouble with ADL's and with rotation of the head in daily life for driving.  The primary muscles involved are the ***.  We talked about treatments.  We talked about the value of botox.  The patient was educated on the botulinum toxin the black blox warning and given a copy of the botox patient medication guide.  The patient understands that this warning states that there have been reported cases of the Botox extending beyond the injection site and creating adverse effects, similar to those of botulism. This included loss of strength, trouble walking, hoarseness, trouble saying words clearly, loss of bladder control, trouble breathing, trouble swallowing, diplopia, blurry vision and ptosis. Most of the distant spread of Botox was happening in patients, primarily children, who received medication for spasticity or for cervical dystonia. The patient expressed understanding and desire to proceed.  I will try to get authorization.  She just had her last injections with Dr. Evern Bio on July 29.  She will not be due until at least 1 November.  *** Subjective:   Dawn Jackson was seen today in neurologic consultation at the request of Thenganatt, Chales Abrahams, MD.  The consultation is for the evaluation of cervical dystonia.  Medical records made available to me are reviewed.  Patient was first seen by [facility] neurology in June, 2021.  She was seen by Dr. Lucia Gaskins.  That was a visit primarily for headache.  She came back in November, 2022 to discuss head tremor.  She felt that the patient had essential tremor and recommended primidone.  He was on primidone for a while, with minimal benefit.  She was subsequently referred to [facility] and saw Dr. Evern Bio.  Her first appointment was June 06, 2022.  She diagnosed the patient with cervical dystonia.  Patient was  injected with her first series of Botox on August 13, 2022.  She was given 50 units, with the being noted that there was 25 units in each splenius capitis I believe (difficult to tell as it just states splenius capitis 25, 25 so I am sure if it is 2 locations in the same muscle that were both given 25 units or a left and right splenius capitis that were each given 25 units).  Patient came back almost immediately after injection stating that she had neck pain and she was referred for physical therapy and given a trial of baclofen.  She had her second series of injections on July 29.  This time, she was given 60 units (again, it is unclear exactly if these were on 1 side or 2 different injections in the same muscle of 30 units each).  These were not EMG guided.  The patient is a 52 y.o. year old female with a history of ***.  The first symptom began *** years ago.   Neuroimaging has *** previously been performed.  It *** available for my review today.  PREVIOUS MEDICATIONS: ***  ALLERGIES:   Allergies  Allergen Reactions   Amitiza [Lubiprostone] Other (See Comments)    Abd pain   Linzess [Linaclotide] Other (See Comments)    Abd cramps    CURRENT MEDICATIONS:  Outpatient Encounter Medications as of 12/19/2022  Medication Sig   DULoxetine (CYMBALTA) 60 MG capsule Take 120 mg by mouth in the morning.   estradiol (ESTRACE) 1 MG tablet Take 1 mg by mouth  daily.   gabapentin (NEURONTIN) 300 MG capsule Take 2 capsules (600 mg total) by mouth 3 (three) times daily. (Patient taking differently: Take 300 mg by mouth 2 (two) times daily. Take 3 capsules in the morning and take 3 capsules in the evening.)   hydrOXYzine (VISTARIL) 50 MG capsule Take 50 mg by mouth as needed.   ibuprofen (ADVIL) 800 MG tablet Take 800 mg by mouth daily as needed.   Plecanatide (TRULANCE) 3 MG TABS Take 1 tablet (3 mg total) by mouth daily.   rizatriptan (MAXALT-MLT) 10 MG disintegrating tablet Take 1 tablet (10 mg total) by  mouth as needed for migraine. May repeat in 2 hours if needed   traZODone (DESYREL) 100 MG tablet Take 100 mg by mouth at bedtime.   No facility-administered encounter medications on file as of 12/19/2022.    Objective:   PHYSICAL EXAMINATION:    VITALS:  There were no vitals filed for this visit.  GEN:  Normal appears female in no acute distress.  Appears stated age. HEENT:  Normocephalic, atraumatic. The mucous membranes are moist. The superficial temporal arteries are without ropiness or tenderness. Cardiovascular: Regular rate and rhythm. Lungs: Clear to auscultation bilaterally. Neck/Heme: There are no carotid bruits noted bilaterally.  NEUROLOGICAL: Orientation:  The patient is alert and oriented x 3.   Cranial nerves: There is good facial symmetry.  Extraocular muscles are intact and visual fields are full to confrontational testing. Speech is fluent and clear. Soft palate rises symmetrically and there is no tongue deviation. Hearing is intact to conversational tone. Tone: Tone is good throughout. Sensation: Sensation is intact to light touch and pinprick throughout (facial, trunk, extremities). Vibration is intact at the bilateral big toe. There is no extinction with double simultaneous stimulation. There is no sensory dermatomal level identified. Coordination:  The patient has no difficulty with RAM's or FNF bilaterally. Motor: Strength is 5/5 in the bilateral upper and lower extremities.  Shoulder shrug is equal and symmetric. There is no pronator drift.  There are no fasciculations noted. DTR's: Deep tendon reflexes are 2/4 at the bilateral biceps, triceps, brachioradialis, patella and achilles.  Plantar responses are downgoing bilaterally. Gait and Station: The patient is able to ambulate without difficulty. The patient is able to heel toe walk without any difficulty. The patient is able to ambulate in a tandem fashion. The patient is able to stand in the Romberg  position.    Total time spent on today's visit was ***greater than 60 minutes, including both face-to-face time and nonface-to-face time.  Time included that spent on review of records (prior notes available to me/labs/imaging if pertinent), discussing treatment and goals, answering patient's questions and coordinating care.   Cc:  Etta Grandchild, MD

## 2022-12-18 DIAGNOSIS — R300 Dysuria: Secondary | ICD-10-CM | POA: Diagnosis not present

## 2022-12-19 ENCOUNTER — Ambulatory Visit: Payer: 59 | Admitting: Neurology

## 2022-12-19 ENCOUNTER — Encounter: Payer: Self-pay | Admitting: Neurology

## 2022-12-19 VITALS — BP 112/60 | HR 67 | Ht 67.0 in | Wt 123.4 lb

## 2022-12-19 DIAGNOSIS — G243 Spasmodic torticollis: Secondary | ICD-10-CM

## 2022-12-19 DIAGNOSIS — M542 Cervicalgia: Secondary | ICD-10-CM

## 2022-12-24 DIAGNOSIS — F4389 Other reactions to severe stress: Secondary | ICD-10-CM | POA: Diagnosis not present

## 2022-12-26 DIAGNOSIS — M542 Cervicalgia: Secondary | ICD-10-CM | POA: Diagnosis not present

## 2022-12-26 DIAGNOSIS — M47812 Spondylosis without myelopathy or radiculopathy, cervical region: Secondary | ICD-10-CM | POA: Diagnosis not present

## 2022-12-27 ENCOUNTER — Telehealth: Payer: Self-pay | Admitting: Pharmacy Technician

## 2022-12-27 ENCOUNTER — Other Ambulatory Visit (HOSPITAL_COMMUNITY): Payer: Self-pay

## 2022-12-27 NOTE — Telephone Encounter (Signed)
Pharmacy Patient Advocate Encounter   Received notification from CoverMyMeds that prior authorization for XEOMIN 200U is required/requested.   Insurance verification completed.   The patient is insured through [employer] .   Per test claim: PA required; PA submitted to [employer] via CoverMyMeds Key/confirmation #/EOC [id number] Status is pending
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
## 2022-12-28 ENCOUNTER — Ambulatory Visit: Payer: 59 | Admitting: Sports Medicine

## 2022-12-28 VITALS — BP 110/72 | HR 98 | Ht 67.0 in | Wt 123.0 lb

## 2022-12-28 DIAGNOSIS — M9908 Segmental and somatic dysfunction of rib cage: Secondary | ICD-10-CM

## 2022-12-28 DIAGNOSIS — M542 Cervicalgia: Secondary | ICD-10-CM | POA: Diagnosis not present

## 2022-12-28 DIAGNOSIS — M9901 Segmental and somatic dysfunction of cervical region: Secondary | ICD-10-CM

## 2022-12-28 DIAGNOSIS — M9902 Segmental and somatic dysfunction of thoracic region: Secondary | ICD-10-CM | POA: Diagnosis not present

## 2022-12-28 NOTE — Patient Instructions (Signed)
Follow up 1-2 weeks to discuss hip pain 3-4 week follow up [facility]

## 2023-01-02 DIAGNOSIS — M47812 Spondylosis without myelopathy or radiculopathy, cervical region: Secondary | ICD-10-CM | POA: Diagnosis not present

## 2023-01-02 DIAGNOSIS — M542 Cervicalgia: Secondary | ICD-10-CM | POA: Diagnosis not present

## 2023-01-03 NOTE — Telephone Encounter (Signed)
Pharmacy Patient Advocate Encounter  Received notification from [employer] that Prior Authorization for Xeomin 200UNIT solution has been APPROVED from 12-28-2022 to 12-27-2023   PA #/Case ID/Reference #: [id number]

## 2023-01-04 DIAGNOSIS — M47812 Spondylosis without myelopathy or radiculopathy, cervical region: Secondary | ICD-10-CM | POA: Diagnosis not present

## 2023-01-04 DIAGNOSIS — M542 Cervicalgia: Secondary | ICD-10-CM | POA: Diagnosis not present
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
## 2023-01-08 ENCOUNTER — Ambulatory Visit (INDEPENDENT_AMBULATORY_CARE_PROVIDER_SITE_OTHER): Payer: 59

## 2023-01-08 ENCOUNTER — Ambulatory Visit: Payer: 59 | Admitting: Sports Medicine

## 2023-01-08 VITALS — BP 108/72 | HR 86 | Ht 67.0 in | Wt 123.0 lb

## 2023-01-08 DIAGNOSIS — M160 Bilateral primary osteoarthritis of hip: Secondary | ICD-10-CM | POA: Diagnosis not present

## 2023-01-08 DIAGNOSIS — M25551 Pain in right hip: Secondary | ICD-10-CM | POA: Diagnosis not present

## 2023-01-08 DIAGNOSIS — M25552 Pain in left hip: Secondary | ICD-10-CM

## 2023-01-08 NOTE — Patient Instructions (Addendum)
Hip HEP  Xrays on the way out  Keep follow up  Tylenol [phone] mg 2-3 times a day for pain relief

## 2023-01-09 ENCOUNTER — Telehealth: Payer: Self-pay | Admitting: Neurology

## 2023-01-09 ENCOUNTER — Other Ambulatory Visit (HOSPITAL_COMMUNITY): Payer: Self-pay

## 2023-01-09 NOTE — Telephone Encounter (Signed)
Left a Voicemail to have patient call back to schedule her first  Xeomine injection with Tat.  It will be buy and bill

## 2023-01-09 NOTE — Telephone Encounter (Signed)
Pt called in stating she has heard from her insurance that they will cover the [facility]. She is wondering if there is anything else that she needs before scheduling the injection appointment?

## 2023-01-09 NOTE — Telephone Encounter (Signed)
Working on Industrial/product designer in and then will call patient to get her scheduled

## 2023-01-16 DIAGNOSIS — F4389 Other reactions to severe stress: Secondary | ICD-10-CM | POA: Diagnosis not present

## 2023-01-18 DIAGNOSIS — M47812 Spondylosis without myelopathy or radiculopathy, cervical region: Secondary | ICD-10-CM | POA: Diagnosis not present

## 2023-01-18 DIAGNOSIS — M542 Cervicalgia: Secondary | ICD-10-CM | POA: Diagnosis not present
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
## 2023-01-22 ENCOUNTER — Ambulatory Visit: Payer: 59 | Admitting: Sports Medicine

## 2023-01-22 VITALS — BP 118/82 | HR 95 | Ht 67.0 in | Wt 121.0 lb

## 2023-01-22 DIAGNOSIS — M9908 Segmental and somatic dysfunction of rib cage: Secondary | ICD-10-CM | POA: Diagnosis not present

## 2023-01-22 DIAGNOSIS — M25551 Pain in right hip: Secondary | ICD-10-CM

## 2023-01-22 DIAGNOSIS — M9903 Segmental and somatic dysfunction of lumbar region: Secondary | ICD-10-CM

## 2023-01-22 DIAGNOSIS — M9905 Segmental and somatic dysfunction of pelvic region: Secondary | ICD-10-CM

## 2023-01-22 DIAGNOSIS — R519 Headache, unspecified: Secondary | ICD-10-CM

## 2023-01-22 DIAGNOSIS — M9901 Segmental and somatic dysfunction of cervical region: Secondary | ICD-10-CM | POA: Diagnosis not present

## 2023-01-22 DIAGNOSIS — M25552 Pain in left hip: Secondary | ICD-10-CM | POA: Diagnosis not present

## 2023-01-22 DIAGNOSIS — M9902 Segmental and somatic dysfunction of thoracic region: Secondary | ICD-10-CM | POA: Diagnosis not present

## 2023-01-22 DIAGNOSIS — M542 Cervicalgia: Secondary | ICD-10-CM

## 2023-01-22 NOTE — Patient Instructions (Signed)
4 week follow up [facility]

## 2023-01-24 DIAGNOSIS — M542 Cervicalgia: Secondary | ICD-10-CM | POA: Diagnosis not present

## 2023-01-28 DIAGNOSIS — Z124 Encounter for screening for malignant neoplasm of cervix: Secondary | ICD-10-CM | POA: Diagnosis not present

## 2023-01-28 DIAGNOSIS — Z1151 Encounter for screening for human papillomavirus (HPV): Secondary | ICD-10-CM | POA: Diagnosis not present

## 2023-01-30 DIAGNOSIS — F4389 Other reactions to severe stress: Secondary | ICD-10-CM | POA: Diagnosis not present

## 2023-02-01 ENCOUNTER — Ambulatory Visit: Payer: 59 | Admitting: Neurology

## 2023-02-01 DIAGNOSIS — G243 Spasmodic torticollis: Secondary | ICD-10-CM

## 2023-02-01 MED ORDER — INCOBOTULINUMTOXINA 100 UNITS IM SOLR
200.0000 [IU] | INTRAMUSCULAR | Status: AC
  Administered 2023-02-01: 200 [IU] via INTRAMUSCULAR

## 2023-02-01 NOTE — Procedures (Signed)
[facility]   Procedure Note Botox  Attending: Dr. Lurena Joiner [place] Warriner  Preoperative Diagnosis(es): Cervical Dystonia  Result History  Onset of effect: n/a  Duration of Benefit: n/a  Adverse Effects: n/a   Consent obtained from: The patient Benefits discussed included, but were not limited to decreased muscle tightness, increased joint range of motion, and decreased pain.  Risk discussed included, but were not limited pain and discomfort, bleeding, bruising, excessive weakness, venous thrombosis, muscle atrophy and dysphagia.  A copy of the patient medication guide was given to the patient which explains the blackbox warning.  Patients identity and treatment sites confirmed Yes.  .  Details of Procedure: Skin was cleaned with alcohol.  A 30 gauge, 25mm  needle was introduced to the target muscle, except for posterior splenius where 27 gauge, 1.5 inch needle used.   Prior to injection, the needle plunger was aspirated to make sure the needle was not within a blood vessel.  There was no blood retrieved on aspiration.    Following is a summary of the muscles injected  And the amount of incobotulinumtoxin A used:   Dilution 0.9% preservative free saline mixed with 100 u incobotulinumtoxin A to make 10 U per 0.1cc  Injections  Location Left  Right Units Number of sites        Sternocleidomastoid 50  50 1  Splenius Capitus, posterior approach  70 70 1  Splenius Capitus, lateral approach  30 30 1   Levator Scapulae      Trapezius 20/10 20 50         TOTAL UNITS:   200    Agent: Incobotulinumtoxin A (Xeomin) 2 vials of Botox were used, each containing 100 units and freshly diluted with 1 mL of sterile, non-preserved saline   Total injected (Units): 200  Total wasted (Units): none wasted   Pt tolerated procedure well without complications.   Reinjection is anticipated in 3 months.

## 2023-02-05 DIAGNOSIS — M542 Cervicalgia: Secondary | ICD-10-CM | POA: Diagnosis not present

## 2023-02-06 ENCOUNTER — Other Ambulatory Visit: Payer: Self-pay | Admitting: Obstetrics and Gynecology

## 2023-02-06 DIAGNOSIS — N63 Unspecified lump in unspecified breast: Secondary | ICD-10-CM

## 2023-02-06 DIAGNOSIS — N6320 Unspecified lump in the left breast, unspecified quadrant: Secondary | ICD-10-CM | POA: Diagnosis not present

## 2023-02-13 DIAGNOSIS — F4389 Other reactions to severe stress: Secondary | ICD-10-CM | POA: Diagnosis not present
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
## 2023-02-19 ENCOUNTER — Ambulatory Visit
Admission: RE | Admit: 2023-02-19 | Discharge: 2023-02-19 | Disposition: A | Discharge status: Home / Self Care | Payer: 59 | Source: Ambulatory Visit | Attending: Obstetrics and Gynecology | Admitting: Obstetrics and Gynecology

## 2023-02-19 ENCOUNTER — Ambulatory Visit: Payer: 59 | Admitting: Sports Medicine

## 2023-02-19 VITALS — BP 122/82 | HR 93 | Ht 67.0 in | Wt 122.0 lb

## 2023-02-19 DIAGNOSIS — M9908 Segmental and somatic dysfunction of rib cage: Secondary | ICD-10-CM

## 2023-02-19 DIAGNOSIS — M542 Cervicalgia: Secondary | ICD-10-CM | POA: Diagnosis not present

## 2023-02-19 DIAGNOSIS — M9901 Segmental and somatic dysfunction of cervical region: Secondary | ICD-10-CM | POA: Diagnosis not present

## 2023-02-19 DIAGNOSIS — N63 Unspecified lump in unspecified breast: Secondary | ICD-10-CM

## 2023-02-19 DIAGNOSIS — R519 Headache, unspecified: Secondary | ICD-10-CM

## 2023-02-19 DIAGNOSIS — M9905 Segmental and somatic dysfunction of pelvic region: Secondary | ICD-10-CM

## 2023-02-19 DIAGNOSIS — N6324 Unspecified lump in the left breast, lower inner quadrant: Secondary | ICD-10-CM | POA: Diagnosis not present

## 2023-02-19 DIAGNOSIS — M9902 Segmental and somatic dysfunction of thoracic region: Secondary | ICD-10-CM

## 2023-02-19 DIAGNOSIS — M9903 Segmental and somatic dysfunction of lumbar region: Secondary | ICD-10-CM | POA: Diagnosis not present

## 2023-02-19 DIAGNOSIS — N644 Mastodynia: Secondary | ICD-10-CM | POA: Diagnosis not present

## 2023-02-20 DIAGNOSIS — F4389 Other reactions to severe stress: Secondary | ICD-10-CM | POA: Diagnosis not present

## 2023-02-27 DIAGNOSIS — F4389 Other reactions to severe stress: Secondary | ICD-10-CM | POA: Diagnosis not present

## 2023-03-06 DIAGNOSIS — M5412 Radiculopathy, cervical region: Secondary | ICD-10-CM | POA: Diagnosis not present
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
## 2023-03-19 ENCOUNTER — Ambulatory Visit: Payer: 59 | Admitting: Sports Medicine

## 2023-03-19 VITALS — BP 110/80 | HR 81 | Ht 67.0 in | Wt 124.0 lb

## 2023-03-19 DIAGNOSIS — M9902 Segmental and somatic dysfunction of thoracic region: Secondary | ICD-10-CM | POA: Diagnosis not present

## 2023-03-19 DIAGNOSIS — M542 Cervicalgia: Secondary | ICD-10-CM

## 2023-03-19 DIAGNOSIS — F4389 Other reactions to severe stress: Secondary | ICD-10-CM | POA: Diagnosis not present

## 2023-03-19 DIAGNOSIS — M5030 Other cervical disc degeneration, unspecified cervical region: Secondary | ICD-10-CM | POA: Diagnosis not present

## 2023-03-19 DIAGNOSIS — M9901 Segmental and somatic dysfunction of cervical region: Secondary | ICD-10-CM | POA: Diagnosis not present

## 2023-03-19 DIAGNOSIS — R519 Headache, unspecified: Secondary | ICD-10-CM

## 2023-03-19 DIAGNOSIS — M9908 Segmental and somatic dysfunction of rib cage: Secondary | ICD-10-CM | POA: Diagnosis not present

## 2023-03-20 DIAGNOSIS — M5412 Radiculopathy, cervical region: Secondary | ICD-10-CM | POA: Diagnosis not present

## 2023-03-20 DIAGNOSIS — M542 Cervicalgia: Secondary | ICD-10-CM | POA: Diagnosis not present

## 2023-03-27 DIAGNOSIS — F4389 Other reactions to severe stress: Secondary | ICD-10-CM | POA: Diagnosis not present

## 2023-04-02 DIAGNOSIS — F4389 Other reactions to severe stress: Secondary | ICD-10-CM | POA: Diagnosis not present

## 2023-04-03 ENCOUNTER — Telehealth: Payer: Self-pay

## 2023-04-03 DIAGNOSIS — M542 Cervicalgia: Secondary | ICD-10-CM | POA: Diagnosis not present

## 2023-04-03 DIAGNOSIS — M47812 Spondylosis without myelopathy or radiculopathy, cervical region: Secondary | ICD-10-CM | POA: Diagnosis not present

## 2023-04-03 NOTE — Telephone Encounter (Signed)
Please advise 

## 2023-04-03 NOTE — Telephone Encounter (Signed)
Copied from CRM [id number]. Topic: Clinical - Medical Advice >> Apr 03, 2023  9:35 AM Tiffany H wrote: Reason for CRM: Patient called to ask if Dr. Yetta Barre practices pain management. Please assist. Patient is looking for additional care and has been unsuccessful in finding pain management thus far. Please assist.

## 2023-04-03 NOTE — Telephone Encounter (Signed)
CRM: Reason for CRM: Patient called to ask if Dr. Yetta Barre practices pain management. Please assist. Patient is looking for additional care and has been unsuccessful in finding pain management thus far. Please assist.

## 2023-04-04 NOTE — Telephone Encounter (Signed)
Please advise 

## 2023-04-05 DIAGNOSIS — M5412 Radiculopathy, cervical region: Secondary | ICD-10-CM | POA: Diagnosis not present

## 2023-04-09 NOTE — Telephone Encounter (Signed)
Unable to reach patient. LMTRC  

## 2023-04-11 NOTE — Telephone Encounter (Signed)
Patient scheduled on 12/31 at 10 am

## 2023-04-16 ENCOUNTER — Encounter: Payer: Self-pay | Admitting: Internal Medicine

## 2023-04-16 ENCOUNTER — Ambulatory Visit (INDEPENDENT_AMBULATORY_CARE_PROVIDER_SITE_OTHER): Payer: 59 | Admitting: Internal Medicine

## 2023-04-16 VITALS — BP 110/80 | HR 77 | Temp 98.0°F | Ht 67.0 in | Wt 121.8 lb

## 2023-04-16 DIAGNOSIS — M47812 Spondylosis without myelopathy or radiculopathy, cervical region: Secondary | ICD-10-CM | POA: Diagnosis not present

## 2023-04-16 DIAGNOSIS — Z79891 Long term (current) use of opiate analgesic: Secondary | ICD-10-CM

## 2023-04-16 MED ORDER — MELOXICAM 15 MG PO TABS: 15.0000 mg | ORAL_TABLET | Freq: Every day | ORAL | 0 refills | Status: DC

## 2023-04-16 MED ORDER — OXYCODONE HCL 5 MG PO TABS
5.0000 mg | ORAL_TABLET | Freq: Every day | ORAL | 0 refills | Status: DC | PRN
Start: 2023-04-16 — End: 2023-05-23

## 2023-04-16 MED ORDER — METHOCARBAMOL 750 MG PO TABS: 750.0000 mg | ORAL_TABLET | Freq: Three times a day (TID) | ORAL | 0 refills | Status: DC | PRN

## 2023-04-16 NOTE — Progress Notes (Signed)
 Subjective:  Patient ID: Dawn Jackson, female    DOB: 1970-09-11  Age: 52 y.o. MRN: [id number]  CC: Pain Management   HPI Dawn Jackson presents for f/up ----  Discussed the use of AI scribe software for clinical note transcription with the patient, who gave verbal consent to proceed.  History of Present Illness   The patient, diagnosed with cervical dystonia, has been receiving Botox  injections since April of the previous year, with a total of three treatments to date. These treatments have significantly improved her condition. However, she experienced an abrupt onset of severe neck pain after the first injection. For the past five to six months, she has been under the care of Beverley Millman for acute management, which included physical therapy and manipulation by Dr. Leonce. An MRI revealed a bulging disc at C5-6 pressing on a nerve on the left and another at C6-7. Despite the findings, she was deemed not a candidate for neurosurgery due to the absence of radiating arm pain.  Her pain management regimen includes daily Tylenol  and meloxicam , with methocarbamol  and oxycodone  (5mg ) as needed. She does not take these medications daily, but usage increases in the two months following Botox  injections. She also uses various topical treatments, including lidocaine , Biofreeze, and topical THC, along with heat therapy. She maintains a regular exercise routine to manage her condition.  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 She is hopeful for an improvement in her chronic pain situation. She has a history of alcoholism and cocaine addiction but has maintained strong sobriety. She is currently on Cymbalta  for depression and anxiety, and Trazodone  for sleep. She has not experienced any struggles with depression or anxiety recently.       Outpatient Medications Prior to Visit  Medication Sig Dispense Refill   acetaminophen  (TYLENOL ) 500 MG tablet Take 1,000 mg by  mouth 3 (three) times daily as needed for mild pain (pain score 1-3) or moderate pain (pain score 4-6).     DULoxetine  (CYMBALTA ) 60 MG capsule Take 120 mg by mouth in the morning.     Estradiol (DIVIGEL) 1 MG/GM GEL Apply 1 mg topically daily.     gabapentin  (NEURONTIN ) 100 MG capsule Take 200 mg by mouth daily.     hydrOXYzine  (VISTARIL ) 50 MG capsule Take 50 mg by mouth as needed.     ibuprofen  (ADVIL ) 800 MG tablet Take 800 mg by mouth daily as needed.     traZODone  (DESYREL ) 100 MG tablet Take 100 mg by mouth at bedtime.     meloxicam  (MOBIC ) 15 MG tablet Take 15 mg by mouth daily.     methocarbamol  (ROBAXIN ) 750 MG tablet Take 750 mg by mouth daily as needed for muscle spasms.     oxyCODONE  (OXY IR/ROXICODONE ) 5 MG immediate release tablet Take 5 mg by mouth 3 (three) times daily as needed.     estradiol (ESTRACE) 1 MG tablet Take 1 mg by mouth daily.     Plecanatide  (TRULANCE ) 3 MG TABS Take 1 tablet (3 mg total) by mouth daily. 90 tablet 2   Facility-Administered Medications Prior to Visit  Medication Dose Route Frequency Provider Last Rate Last Admin   incobotulinumtoxinA  (XEOMIN ) 100 units injection 200 Units  200 Units Intramuscular Q90 days Tat, Asberry RAMAN, DO   200 Units at 02/01/23 1319    ROS Review of Systems  Constitutional: Negative.  Negative for appetite change, chills, diaphoresis and fatigue.  HENT: Negative.  Eyes: Negative.   Respiratory:  Negative for cough, chest tightness, shortness of breath and wheezing.   Cardiovascular:  Negative for chest pain, palpitations and leg swelling.  Gastrointestinal:  Negative for abdominal pain, constipation, diarrhea, nausea and vomiting.  Endocrine: Negative.   Genitourinary: Negative.  Negative for difficulty urinating.  Musculoskeletal:  Positive for neck pain. Negative for arthralgias, gait problem and neck stiffness.  Skin: Negative.   Neurological: Negative.  Negative for dizziness, speech difficulty, weakness,  light-headedness and headaches.  Hematological:  Negative for adenopathy. Does not bruise/bleed easily.  Psychiatric/Behavioral:  Positive for dysphoric mood and sleep disturbance. Negative for behavioral problems, confusion, decreased concentration, hallucinations, self-injury and suicidal ideas. The patient is nervous/anxious. The patient is not hyperactive.     Objective:  BP 110/80 (BP Location: Left Arm, Patient Position: Sitting, Cuff Size: Normal)   Pulse 77   Temp 98 F (36.7 C) (Oral)   Ht 5' 7 (1.702 m)   Wt 121 lb 12.8 oz (55.2 kg)   SpO2 99%   BMI 19.08 kg/m   BP Readings from Last 3 Encounters:  04/16/23 110/80  03/19/23 110/80  02/19/23 122/82    Wt Readings from Last 3 Encounters:  04/16/23 121 lb 12.8 oz (55.2 kg)  03/19/23 124 lb (56.2 kg)  02/19/23 122 lb (55.3 kg)    Physical Exam Vitals reviewed.  Constitutional:      Appearance: Normal appearance.  HENT:     Mouth/Throat:     Mouth: Mucous membranes are moist.  Eyes:     General: No scleral icterus.    Conjunctiva/sclera: Conjunctivae normal.  Cardiovascular:     Rate and Rhythm: Normal rate and regular rhythm.     Heart sounds: No murmur heard.    No friction rub. No gallop.  Pulmonary:     Effort: Pulmonary effort is normal.     Breath sounds: No stridor. No wheezing, rhonchi or rales.  Abdominal:     General: Abdomen is flat.     Palpations: There is no mass.     Tenderness: There is no abdominal tenderness. There is no guarding.     Hernia: No hernia is present.  Musculoskeletal:        General: Normal range of motion.     Cervical back: Neck supple.     Right lower leg: No edema.     Left lower leg: No edema.  Lymphadenopathy:     Cervical: No cervical adenopathy.  Skin:    General: Skin is warm and dry.  Neurological:     General: No focal deficit present.     Mental Status: She is alert. Mental status is at baseline.  Psychiatric:        Attention and Perception: Attention  normal. She is attentive.        Mood and Affect: Mood is anxious. Mood is not depressed. Affect is not flat or tearful.        Behavior: Behavior normal.        Thought Content: Thought content normal.        Cognition and Memory: Cognition normal.     Lab Results  Component Value Date   WBC 5.1 11/07/2021   HGB 13.8 11/07/2021   HCT 40.8 11/07/2021   PLT 251.0 11/07/2021   GLUCOSE 111 (H) 04/18/2022   CHOL 179 01/10/2018   TRIG 134 01/10/2018   HDL 49 01/10/2018   LDLCALC 103 (H) 01/10/2018   ALT 14 11/07/2021   AST 19  11/07/2021   NA 137 04/18/2022   K 4.3 04/18/2022   CL 103 04/18/2022   CREATININE 0.77 04/18/2022   BUN 7 04/18/2022   CO2 26 04/18/2022   TSH 2.41 11/07/2021   HGBA1C 5.0 10/12/2019    MM 3D DIAGNOSTIC MAMMOGRAM UNILATERAL LEFT BREAST Result Date: 02/19/2023 CLINICAL DATA:  Patient presents reporting a palpable lump with associated discomfort, that waxes and wanes. Lump is currently smaller than it was a few weeks ago. Patient was seen for this lump on 02/07/2022 and underwent a subsequent ultrasound-guided core needle biopsy of a mixed echogenicity lesion at 7 o'clock. Pathology revealed florid adenosis in the background of fibrocystic changes including fibrosis and apocrine metaplasia stoma which was concordant. EXAM: DIGITAL DIAGNOSTIC UNILATERAL LEFT MAMMOGRAM WITH TOMOSYNTHESIS AND CAD; ULTRASOUND LEFT BREAST LIMITED TECHNIQUE: Left digital diagnostic mammography and breast tomosynthesis was performed. The images were evaluated with computer-aided detection. ; Targeted ultrasound examination of the left breast was performed. COMPARISON:  Previous exam(s). ACR Breast Density Category c: The breasts are heterogeneously dense, which may obscure small masses. FINDINGS: There are 3 post biopsy marker clips in the medial left breast, 2 clips that lie at 9-10 o'clock, and the more recent ribbon clip that lies in the lower inner breast. These are stable from the post  clip mammograms dated 09/24/2022. There are no defined breast masses, areas architectural distortion or suspicious calcifications. On physical exam, there is a small mobile firm palpable nodule in the left breast near 7 o'clock. Targeted ultrasound is performed, showing a mixed echogenicity, but predominantly hyperechoic, ill-defined mass/lesion in the left breast at 7 o'clock, 2 cm the nipple, measuring approximately 1.4 x 0.6 x 1.3 cm, without significant change from the prior exams. This is the lesion that was previously biopsied. IMPRESSION: 1. No evidence of breast malignancy. 2. Biopsy-proven benign, 1.4 cm mass/lesion in the left breast at 7 o'clock corresponding to palpable lump and area of focal pain. RECOMMENDATION: 1. Annual screening mammography. Last screening study performed on 09/24/2022. I have discussed the findings and recommendations with the patient. If applicable, a reminder letter will be sent to the patient regarding the next appointment. BI-RADS CATEGORY  2: Benign. Electronically Signed   By: Alm Parkins M.D.   On: 02/19/2023 09:41   US  LIMITED ULTRASOUND INCLUDING AXILLA LEFT BREAST  Result Date: 02/19/2023 CLINICAL DATA:  Patient presents reporting a palpable lump with associated discomfort, that waxes and wanes. Lump is currently smaller than it was a few weeks ago. Patient was seen for this lump on 02/07/2022 and underwent a subsequent ultrasound-guided core needle biopsy of a mixed echogenicity lesion at 7 o'clock. Pathology revealed florid adenosis in the background of fibrocystic changes including fibrosis and apocrine metaplasia stoma which was concordant. EXAM: DIGITAL DIAGNOSTIC UNILATERAL LEFT MAMMOGRAM WITH TOMOSYNTHESIS AND CAD; ULTRASOUND LEFT BREAST LIMITED TECHNIQUE: Left digital diagnostic mammography and breast tomosynthesis was performed. The images were evaluated with computer-aided detection. ; Targeted ultrasound examination of the left breast was performed.  COMPARISON:  Previous exam(s). ACR Breast Density Category c: The breasts are heterogeneously dense, which may obscure small masses. FINDINGS: There are 3 post biopsy marker clips in the medial left breast, 2 clips that lie at 9-10 o'clock, and the more recent ribbon clip that lies in the lower inner breast. These are stable from the post clip mammograms dated 09/24/2022. There are no defined breast masses, areas architectural distortion or suspicious calcifications. On physical exam, there is a small mobile firm palpable nodule in  the left breast near 7 o'clock. Targeted ultrasound is performed, showing a mixed echogenicity, but predominantly hyperechoic, ill-defined mass/lesion in the left breast at 7 o'clock, 2 cm the nipple, measuring approximately 1.4 x 0.6 x 1.3 cm, without significant change from the prior exams. This is the lesion that was previously biopsied. IMPRESSION: 1. No evidence of breast malignancy. 2. Biopsy-proven benign, 1.4 cm mass/lesion in the left breast at 7 o'clock corresponding to palpable lump and area of focal pain. RECOMMENDATION: 1. Annual screening mammography. Last screening study performed on 09/24/2022. I have discussed the findings and recommendations with the patient. If applicable, a reminder letter will be sent to the patient regarding the next appointment. BI-RADS CATEGORY  2: Benign. Electronically Signed   By: Alm Parkins M.D.   On: 02/19/2023 09:41    Assessment & Plan:   Spondylosis of cervical region without myelopathy or radiculopathy -     Meloxicam ; Take 1 tablet (15 mg total) by mouth daily.  Dispense: 30 tablet; Refill: 0 -     oxyCODONE  HCl; Take 1 tablet (5 mg total) by mouth daily as needed for breakthrough pain.  Dispense: 30 tablet; Refill: 0 -     Methocarbamol ; Take 1 tablet (750 mg total) by mouth every 8 (eight) hours as needed for muscle spasms.  Dispense: 90 tablet; Refill: 0  Long-term current use of opiate analgesic -     Naloxone  HCl; Place  1 spray into the nose once for 1 dose.  Dispense: 2 each; Refill: 2     Follow-up: Return in about 4 weeks (around 05/14/2023).  Debby Molt, MD

## 2023-04-16 NOTE — Patient Instructions (Signed)
 Degenerative Disk Disease  Degenerative disk disease is a condition caused by changes that occur in the spinal disks as a person ages. Spinal disks are soft and compressible disks located between the bones of your spine (vertebrae). These disks act like shock absorbers. Degenerative disk disease can affect the whole spine. However, the neck and lower back are most often affected. Many changes can occur in the spinal disks with aging, such as: The spinal disks may dry and shrink. Small tears may occur in the tough, outer covering of the disk (annulus). The disk space may become smaller due to loss of water. Abnormal growths in the bone (spurs) may occur. This can put pressure on the nerve roots exiting the spinal canal, causing pain. The spinal canal may become narrowed. What are the causes? This condition may be caused by: Normal degeneration with age. Injuries. Certain activities and sports that cause damage. What increases the risk? The following factors may make you more likely to develop this condition: Being overweight. Having a family history of degenerative disk disease. Smoking and use of products that contain nicotine and tobacco. Sudden injury. Doing work that requires heavy lifting. What are the signs or symptoms? Symptoms of this condition include: Pain that varies in intensity. Some people have no pain, while others have severe pain. The location of the pain depends on the part of your backbone that is affected. You may have: Pain in your neck or arm if a disk in your neck area is affected. Pain in your back, buttocks, or legs if a disk in your lower back is affected. Pain that becomes worse while bending or reaching up, or with twisting movements. Pain that may start gradually and worsen as time passes. It may also start after a major or minor injury. Numbness or tingling in the arms or legs. How is this diagnosed? This condition may be diagnosed based on: Your symptoms  and medical history. A physical exam. Imaging tests, including: X-ray of the spine. CT scan. MRI. How is this treated? This condition may be treated with: Medicines. Injection of steroids into the back. Rehabilitation exercises. These activities aim to strengthen muscles in your back and abdomen to better support your spine. If treatments do not help to relieve your symptoms or you have severe pain, you may need surgery. Follow these instructions at home: Medicines Take over-the-counter and prescription medicines only as told by your health care provider. Ask your health care provider if the medicine prescribed to you: Requires you to avoid driving or using machinery. Can cause constipation. You may need to take these actions to prevent or treat constipation: Drink enough fluid to keep your urine pale yellow. Take over-the-counter or prescription medicines. Eat foods that are high in fiber, such as beans, whole grains, and fresh fruits and vegetables. Limit foods that are high in fat and processed sugars, such as fried or sweet foods. Activity Rest as told by your health care provider. Avoid sitting for a long time without moving. Get up to take short walks every 1-2 hours. This is important to improve blood flow and breathing. Ask for help if you feel weak or unsteady. Return to your normal activities as told by your health care provider. Ask your health care provider what activities are safe for you. Perform relaxation exercises as told by your health care provider. Maintain good posture. Do not lift anything that is heavier than 10 lb (4.5 kg), or the limit that you are told, until your health  care provider says that it is safe. Follow proper lifting and walking techniques as told by your health care provider. Managing pain, stiffness, and swelling     If directed, put ice on the painful area. Icing can help to relieve pain. To do this: Put ice in a plastic bag. Place a towel  between your skin and the bag. Leave the ice on for 20 minutes, 2-3 times a day. Remove the ice if your skin turns bright red. This is very important. If you cannot feel pain, heat, or cold, you have a greater risk of damage to the area. If directed, apply heat to the painful area as often as told by your health care provider. Heat can reduce the stiffness of your muscles. Use the heat source that your health care provider recommends, such as a moist heat pack or a heating pad. Place a towel between your skin and the heat source. Leave the heat on for 20-30 minutes. Remove the heat if your skin turns bright red. This is especially important if you are unable to feel pain, heat, or cold. You may have a greater risk of getting burned. General instructions Change your sitting, standing, and sleeping habits as told by your health care provider. Avoid sitting in the same position for long periods of time. Change positions frequently. Lose weight or maintain a healthy weight as told by your health care provider. Do not use any products that contain nicotine or tobacco, such as cigarettes, e-cigarettes, and chewing tobacco. If you need help quitting, ask your health care provider. Wear supportive footwear. Keep all follow-up visits. This is important. This may include visits for physical therapy. Contact a health care provider if you: Have pain that does not go away within 1-4 weeks. Lose your appetite. Lose weight without trying. Get help right away if you: Have severe pain. Notice weakness in your arms, hands, or legs. Begin to lose control of your bladder or bowel movements. Have fevers or night sweats. Summary Degenerative disk disease is a condition caused by changes that occur in the spinal disks as a person ages. This condition can affect the whole spine. However, the neck and lower back are most often affected. Take over-the-counter and prescription medicines only as told by your health  care provider. This information is not intended to replace advice given to you by your health care provider. Make sure you discuss any questions you have with your health care provider. Document Revised: 07/16/2019 Document Reviewed: 07/16/2019 Elsevier Patient Education  2024 [employer].

## 2023-04-17 DIAGNOSIS — Z79891 Long term (current) use of opiate analgesic: Secondary | ICD-10-CM | POA: Insufficient documentation

## 2023-04-17 MED ORDER — NALOXONE HCL 4 MG/0.1ML NA LIQD: 1.0000 | Freq: Once | NASAL | 2 refills | Status: AC

## 2023-04-18 DIAGNOSIS — F4389 Other reactions to severe stress: Secondary | ICD-10-CM | POA: Diagnosis not present

## 2023-04-22 DIAGNOSIS — F4389 Other reactions to severe stress: Secondary | ICD-10-CM | POA: Diagnosis not present

## 2023-04-22 NOTE — Progress Notes (Signed)
 Assessment/Plan:   Cervical dystonia  -Patient previously with Dr. Lori.  Patient had reported extreme pain with Botox  injections, but they did not use EMG guidance either.  Patients first injections here February 01, 2023.  Still with significant sensitivity, but not like prior.  Patient has seen multiple providers besides neurology, including Beverley Economy for pain management, physical therapy, Dr. Leonce for OMT.  Patients primary care has recently taken over her pain management.  She is on oxycodone , mobic  and robaxin .  -she wants to see if insurance won't allow xeomin  q 3 months.  We will ask about that and see if we can increase the dose to 250 U.  If not, we will return to onobotulinum toxin as its always dosed q 3 months   Subjective:   Dawn Jackson was seen today in follow up for cervical dystonia.  Botox /Xeomin  injections done February 01, 2023. She wanted to change to xeomin  (prior onobotulinumtoxin) given longer lasting medication (q 4months v q 3 months). She reports that she noted the medication worked but its wearing off.  Wonders if she should return to regular onobotulinumtoxin A.  Since last visit, she has also seen Dr. Leonce from sports medicine regarding her neck pain for OMT.  Notes are reviewed.    CURRENT MEDICATIONS:  Outpatient Encounter Medications as of 04/23/2023  Medication Sig   acetaminophen  (TYLENOL ) 500 MG tablet Take 1,000 mg by mouth 3 (three) times daily as needed for mild pain (pain score 1-3) or moderate pain (pain score 4-6).   DULoxetine  (CYMBALTA ) 60 MG capsule Take 120 mg by mouth in the morning.   Estradiol (DIVIGEL) 1 MG/GM GEL Apply 1 mg topically daily.   gabapentin  (NEURONTIN ) 100 MG capsule Take 200 mg by mouth daily.   hydrOXYzine  (VISTARIL ) 50 MG capsule Take 50 mg by mouth as needed.   ibuprofen  (ADVIL ) 800 MG tablet Take 800 mg by mouth daily as needed.   meloxicam  (MOBIC ) 15 MG tablet Take 1 tablet (15 mg total) by mouth daily.    methocarbamol  (ROBAXIN ) 750 MG tablet Take 1 tablet (750 mg total) by mouth every 8 (eight) hours as needed for muscle spasms.   oxyCODONE  (OXY IR/ROXICODONE ) 5 MG immediate release tablet Take 1 tablet (5 mg total) by mouth daily as needed for breakthrough pain.   traZODone  (DESYREL ) 100 MG tablet Take 100 mg by mouth at bedtime.   Facility-Administered Encounter Medications as of 04/23/2023  Medication   incobotulinumtoxinA  (XEOMIN ) 100 units injection 200 Units     Objective:   PHYSICAL EXAMINATION:    VITALS:   Vitals:   04/23/23 1301  BP: 122/76  Pulse: 71  SpO2: 98%  Weight: 122 lb (55.3 kg)  Height: 5' 7 (1.702 m)    GEN:  The patient appears stated age and is in NAD. HEENT:  Normocephalic, atraumatic.   Neck/HEME:  There are no carotid bruits bilaterally.  She has geste antagnostiste with occasionally holding the hand to the chin. Head tremor in the no-no direction.  Head slightly to the right.  Has occasional head jerk to the right.   Neurological examination:  Orientation: The patient is alert and oriented x3. Cranial nerves: There is good facial symmetry.The speech is fluent and clear.  Hearing is intact to conversational tone. Sensation: Sensation is intact to light touch throughout Motor: Strength is at least antigravity x4.  Movement examination: Tone: There is normal tone in the UE/LE Abnormal movements: Head tremor, as above.  No rest tremor  in hands.  No postural tremor. Coordination:  There is no decremation with RAM's. Gait and Station: The patient has no difficulty arising out of a deep-seated chair without the use of the hands. The patient's stride length is good as she ambulates out of the office.      Cc:  Joshua Debby CROME, MD

## 2023-04-23 ENCOUNTER — Ambulatory Visit: Payer: 59 | Admitting: Neurology

## 2023-04-23 ENCOUNTER — Encounter: Payer: Self-pay | Admitting: Neurology

## 2023-04-23 VITALS — BP 122/76 | HR 71 | Ht 67.0 in | Wt 122.0 lb

## 2023-04-23 DIAGNOSIS — G243 Spasmodic torticollis: Secondary | ICD-10-CM | POA: Diagnosis not present

## 2023-04-26 DIAGNOSIS — F332 Major depressive disorder, recurrent severe without psychotic features: Secondary | ICD-10-CM | POA: Diagnosis not present

## 2023-04-26 DIAGNOSIS — F1910 Other psychoactive substance abuse, uncomplicated: Secondary | ICD-10-CM | POA: Diagnosis not present

## 2023-04-26 DIAGNOSIS — F411 Generalized anxiety disorder: Secondary | ICD-10-CM | POA: Diagnosis not present

## 2023-04-29 DIAGNOSIS — F4389 Other reactions to severe stress: Secondary | ICD-10-CM | POA: Diagnosis not present

## 2023-05-06 DIAGNOSIS — F4389 Other reactions to severe stress: Secondary | ICD-10-CM | POA: Diagnosis not present

## 2023-05-07 ENCOUNTER — Telehealth: Payer: Self-pay | Admitting: Pharmacy Technician

## 2023-05-07 ENCOUNTER — Other Ambulatory Visit (HOSPITAL_COMMUNITY): Payer: Self-pay

## 2023-05-07 NOTE — Telephone Encounter (Signed)
Pharmacy Patient Advocate Encounter   Received notification from Pt Calls Messages that prior authorization for XEOMIN 50 is required/requested.   Insurance verification completed.   The patient is insured through [employer] .   Per test claim: PA required; PA submitted to above mentioned insurance via CoverMyMeds Key/confirmation #/EOC [facility] Status is pending

## 2023-05-08 ENCOUNTER — Ambulatory Visit: Payer: 59 | Admitting: Internal Medicine

## 2023-05-09 ENCOUNTER — Ambulatory Visit (INDEPENDENT_AMBULATORY_CARE_PROVIDER_SITE_OTHER): Payer: 59 | Admitting: Internal Medicine

## 2023-05-09 ENCOUNTER — Encounter: Payer: Self-pay | Admitting: Internal Medicine

## 2023-05-09 VITALS — BP 108/80 | HR 76 | Ht 67.0 in | Wt 123.8 lb

## 2023-05-09 DIAGNOSIS — Z0001 Encounter for general adult medical examination with abnormal findings: Secondary | ICD-10-CM | POA: Diagnosis not present

## 2023-05-09 DIAGNOSIS — Z114 Encounter for screening for human immunodeficiency virus [HIV]: Secondary | ICD-10-CM | POA: Insufficient documentation

## 2023-05-09 DIAGNOSIS — I950 Idiopathic hypotension: Secondary | ICD-10-CM | POA: Diagnosis not present

## 2023-05-09 DIAGNOSIS — R001 Bradycardia, unspecified: Secondary | ICD-10-CM | POA: Diagnosis not present

## 2023-05-09 DIAGNOSIS — Z1159 Encounter for screening for other viral diseases: Secondary | ICD-10-CM | POA: Diagnosis not present

## 2023-05-09 DIAGNOSIS — K5904 Chronic idiopathic constipation: Secondary | ICD-10-CM | POA: Diagnosis not present

## 2023-05-09 DIAGNOSIS — Z0000 Encounter for general adult medical examination without abnormal findings: Secondary | ICD-10-CM

## 2023-05-09 LAB — TSH: TSH: 1.09 u[IU]/mL (ref 0.35–5.50)

## 2023-05-09 LAB — URINALYSIS, ROUTINE W REFLEX MICROSCOPIC
Bilirubin Urine: NEGATIVE
Hgb urine dipstick: NEGATIVE
Ketones, ur: NEGATIVE
Leukocytes,Ua: NEGATIVE
Nitrite: NEGATIVE
Specific Gravity, Urine: 1.01 (ref 1.000–1.030)
Total Protein, Urine: NEGATIVE
Urine Glucose: NEGATIVE
Urobilinogen, UA: 0.2 (ref 0.0–1.0)
pH: 7 (ref 5.0–8.0)

## 2023-05-09 LAB — BASIC METABOLIC PANEL
BUN: 5 mg/dL — ABNORMAL LOW (ref 6–23)
CO2: 28 meq/L (ref 19–32)
Calcium: 9 mg/dL (ref 8.4–10.5)
Chloride: 101 meq/L (ref 96–112)
Creatinine, Ser: 0.7 mg/dL (ref 0.40–1.20)
GFR: 98.02 mL/min (ref >60.00)
Glucose, Bld: 92 mg/dL (ref 70–99)
Potassium: 3.8 meq/L (ref 3.5–5.1)
Sodium: 136 meq/L (ref 135–145)

## 2023-05-09 LAB — CBC WITH DIFFERENTIAL/PLATELET
Basophils Absolute: 0 10*3/uL (ref 0.0–0.1)
Basophils Relative: 0.5 % (ref 0.0–3.0)
Eosinophils Absolute: 0.1 10*3/uL (ref 0.0–0.7)
Eosinophils Relative: 1.2 % (ref 0.0–5.0)
HCT: 39.1 % (ref 36.0–46.0)
Hemoglobin: 13.1 g/dL (ref 12.0–15.0)
Lymphocytes Relative: 25.9 % (ref 12.0–46.0)
Lymphs Abs: 1.7 10*3/uL (ref 0.7–4.0)
MCHC: 33.6 g/dL (ref 30.0–36.0)
MCV: 96.1 fL (ref 78.0–100.0)
Monocytes Absolute: 0.7 10*3/uL (ref 0.1–1.0)
Monocytes Relative: 10.3 % (ref 3.0–12.0)
Neutro Abs: 4.2 10*3/uL (ref 1.4–7.7)
Neutrophils Relative %: 62.1 % (ref 43.0–77.0)
Platelets: 293 10*3/uL (ref 150.0–400.0)
RBC: 4.07 Mil/uL (ref 3.87–5.11)
RDW: 12.9 % (ref 11.5–15.5)
WBC: 6.7 10*3/uL (ref 4.0–10.5)

## 2023-05-09 LAB — HEPATIC FUNCTION PANEL
ALT: 11 U/L (ref 0–35)
AST: 17 U/L (ref 0–37)
Albumin: 4.5 g/dL (ref 3.5–5.2)
Alkaline Phosphatase: 36 U/L — ABNORMAL LOW (ref 39–117)
Bilirubin, Direct: 0.1 mg/dL (ref 0.0–0.3)
Total Bilirubin: 0.6 mg/dL (ref 0.2–1.2)
Total Protein: 6.5 g/dL (ref 6.0–8.3)

## 2023-05-09 LAB — CORTISOL: Cortisol, Plasma: 6.2 ug/dL

## 2023-05-09 MED ORDER — FLUDROCORTISONE ACETATE 0.1 MG PO TABS: 0.1000 mg | ORAL_TABLET | Freq: Every day | ORAL | 1 refills | Status: DC

## 2023-05-09 NOTE — Patient Instructions (Signed)
 Hypotension As the heart beats, it forces blood through the body. Hypotension, commonly called low blood pressure, is when the force of blood pumping through the arteries is too weak. Arteries are blood vessels that carry blood from the heart throughout the body. Depending on the cause and severity, hypotension may be harmless (benign) or may cause serious problems (be critical). When your blood pressure is too low, you may not get enough blood to your brain or to the rest of your organs. This can cause weakness, light-headedness, a rapid heartbeat, and fainting. What are the causes? This condition may be caused by: Blood loss. Loss of body fluids (dehydration). Heart problems. Hormone (endocrine) problems. Pregnancy. Severe infection. Lack of certain nutrients. Severe allergic reactions (anaphylaxis). Certain medicines, such as blood pressure medicine or medicines that make the body lose excess fluids (diuretics). Sometimes, hypotension may be caused by not taking medicine as directed, such as taking too much of a certain medicine. What increases the risk? The following factors may make you more likely to develop this condition: Age. Risk increases as you get older. Having a condition that affects the heart or the central nervous system. What are the signs or symptoms? Common symptoms of this condition include: Weakness. Light-headedness. Dizziness. Blurred vision. Tiredness (fatigue). Rapid heartbeat. Fainting, in severe cases. How is this diagnosed? This condition is diagnosed based on: Your medical history. Your symptoms. Your blood pressure measurement. Your health care provider will check your blood pressure when you are: Lying down. Sitting. Standing. A blood pressure reading is recorded as two numbers, such as "120 over 80" (or 120/80). The first ("top") number is called the systolic pressure. It is a measure of the pressure in your arteries as your heart beats. The second  ("bottom") number is called the diastolic pressure. It is a measure of the pressure in your arteries when your heart relaxes between beats. Blood pressure is measured in a unit called mm Hg. Healthy blood pressure for most adults is 120/80. If your blood pressure is below 90/60, you may be diagnosed with hypotension. Other information or tests that may be used to diagnose hypotension include: Your other vital signs, such as your heart rate and temperature. Blood tests. Tilt table test. For this test, you will be safely secured to a table that moves you from a lying position to an upright position. Your heart rhythm and blood pressure will be monitored during the test. How is this treated? Treatment for this condition may include: Changing your diet. This may involve drinking more water or increasing your salt (sodium) intake with high-sodium foods. Taking medicines to raise your blood pressure. Changing the dosage of certain medicines you are taking that might be lowering your blood pressure. Wearing compression stockings. These stockings help to prevent blood clots and reduce swelling in your legs. In some cases, you may need to go to the hospital for: Fluid replacement. This means you will receive fluids through an IV. Blood replacement. This means you will receive donated blood through an IV (transfusion). Treating an infection or heart problems, if this applies. Monitoring. You may need to be monitored while medicines that you are taking wear off. Follow these instructions at home: Eating and drinking  Drink enough fluid to keep your urine pale yellow. Eat a healthy diet, and follow instructions from your health care provider about eating or drinking restrictions. A healthy diet includes: Fresh fruits and vegetables. Whole grains. Lean meats. Low-fat dairy products. Increase your salt intake if told  to do so. Do not add extra salt to your diet unless your health care provider tells you  to do that. Eat frequent, small meals. Avoid standing up suddenly after eating. Medicines Take over-the-counter and prescription medicines only as told by your health care provider. Follow instructions from your health care provider about changing the dosage of your current medicines, if this applies. Do not stop or adjust any of your medicines on your own. General instructions  Wear compression stockings as told by your health care provider. Get up slowly from lying down or sitting positions. This gives your blood pressure a chance to adjust. Avoid hot showers and excessive heat as directed by your health care provider. Return to your normal activities as told by your health care provider. Ask your health care provider what activities are safe for you. Do not use any products that contain nicotine or tobacco. These products include cigarettes, chewing tobacco, and vaping devices, such as e-cigarettes. If you need help quitting, ask your health care provider. Keep all follow-up visits. This is important. Contact a health care provider if: You vomit. You have diarrhea. You have a fever for more than 2-3 days. You feel more thirsty than usual. You feel weak and tired. Get help right away if: You have chest pain. You have a fast or irregular heartbeat. You develop numbness in any part of your body. You cannot move your arms or your legs. You have trouble speaking. You become sweaty or feel light-headed. You faint. You feel short of breath. You have trouble staying awake. You feel confused. These symptoms may be an emergency. Get help right away. Call 911. Do not wait to see if the symptoms will go away. Do not drive yourself to the hospital. Summary Hypotension is when the force of blood pumping through the arteries is too weak. Hypotension may be harmless (benign) or may cause serious problems (be critical). Treatment for this condition may include changing your diet, changing  your medicines, and wearing compression stockings. In some cases, you may need to go to the hospital for fluid or blood replacement. This information is not intended to replace advice given to you by your health care provider. Make sure you discuss any questions you have with your health care provider. Document Revised: 11/21/2020 Document Reviewed: 11/21/2020 Elsevier Patient Education  2024 [employer].

## 2023-05-09 NOTE — Progress Notes (Signed)
Subjective:  Patient ID: Dawn Jackson, female    DOB: [birth year]  Age: 53 y.o. MRN: [id number]  CC: Annual Exam   HPI Dawn Jackson presents for a CPX and f/up ---  Discussed the use of AI scribe software for clinical note transcription with the patient, who gave verbal consent to proceed.  History of Present Illness   The patient, with a history of pain, presents with a recent improvement in her condition. She reports feeling better than at her last visit, indicating a positive response to prior treatments. She has been managing constipation with probiotics and fiber powder, which had been effective for three months but recently ceased to provide relief. She denies significant use of oxycodone, a potential contributor to constipation, in the past several days.  She also reports intermittent neck pain and tremors, for which she is awaiting Botox injections. She has been experiencing occasional pain during urination, managed with a medication that turns her urine blue. She denies recent urinary issues.  The patient is also on hormone replacement therapy with estradiol gel and a Mirena IUD for progesterone. She reports frequent night sweats despite this regimen. She has a history of DVT, which influenced her transition from oral to gel estrogen.  She has been experiencing symptoms of low blood pressure, including dizziness and lightheadedness, which she has attempted to manage by adding more salt to her diet. She reports instances of near syncope when standing up too quickly.       Outpatient Medications Prior to Visit  Medication Sig Dispense Refill   acetaminophen (TYLENOL) 500 MG tablet Take 1,000 mg by mouth 3 (three) times daily as needed for mild pain (pain score 1-3) or moderate pain (pain score 4-6).     DULoxetine (CYMBALTA) 60 MG capsule Take 120 mg by mouth in the morning.     Estradiol (DIVIGEL) 1 MG/GM GEL Apply 1 mg topically daily.     gabapentin (NEURONTIN) 100 MG capsule  Take 200 mg by mouth daily.     hydrOXYzine (VISTARIL) 50 MG capsule Take 50 mg by mouth as needed.     ibuprofen (ADVIL) 800 MG tablet Take 800 mg by mouth daily as needed.     meloxicam (MOBIC) 15 MG tablet Take 1 tablet (15 mg total) by mouth daily. 30 tablet 0   methocarbamol (ROBAXIN) 750 MG tablet Take 1 tablet (750 mg total) by mouth every 8 (eight) hours as needed for muscle spasms. 90 tablet 0   oxyCODONE (OXY IR/ROXICODONE) 5 MG immediate release tablet Take 1 tablet (5 mg total) by mouth daily as needed for breakthrough pain. 30 tablet 0   traZODone (DESYREL) 100 MG tablet Take 100 mg by mouth at bedtime.     Facility-Administered Medications Prior to Visit  Medication Dose Route Frequency Provider Last Rate Last Admin   incobotulinumtoxinA (XEOMIN) 100 units injection 200 Units  200 Units Intramuscular Q90 days Tat, Octaviano Batty, DO   200 Units at 02/01/23 1319    ROS Review of Systems  Constitutional: Negative.  Negative for diaphoresis, fatigue and unexpected weight change.  HENT: Negative.    Eyes: Negative.   Respiratory: Negative.  Negative for cough, chest tightness, shortness of breath and wheezing.   Cardiovascular:  Negative for chest pain, palpitations and leg swelling.  Gastrointestinal:  Negative for abdominal pain, diarrhea, nausea and vomiting.  Genitourinary: Negative.  Negative for difficulty urinating and dysuria.  Musculoskeletal: Negative.  Negative for arthralgias and gait problem.  Skin: Negative.  Neurological:  Positive for dizziness, tremors and light-headedness. Negative for weakness and numbness.  Hematological:  Negative for adenopathy. Does not bruise/bleed easily.  Psychiatric/Behavioral: Negative.      Objective:  BP 108/80 (BP Location: Left Arm, Patient Position: Sitting, Cuff Size: Normal)   Pulse 76   Ht 5\' 7"  (1.702 m)   Wt 123 lb 12.8 oz (56.2 kg)   SpO2 98%   BMI 19.39 kg/m   BP Readings from Last 3 Encounters:  05/09/23 108/80   04/23/23 122/76  04/16/23 110/80    Wt Readings from Last 3 Encounters:  05/09/23 123 lb 12.8 oz (56.2 kg)  04/23/23 122 lb (55.3 kg)  04/16/23 121 lb 12.8 oz (55.2 kg)    Physical Exam Vitals reviewed.  Constitutional:      Appearance: Normal appearance. She is not ill-appearing.  HENT:     Mouth/Throat:     Mouth: Mucous membranes are moist.  Eyes:     General: No scleral icterus.    Conjunctiva/sclera: Conjunctivae normal.  Cardiovascular:     Rate and Rhythm: Regular rhythm. Bradycardia present.     Heart sounds: No murmur heard.    No friction rub. No gallop.     Comments: EKG - SB, 55 bpm Otherwise normal EKG Pulmonary:     Effort: Pulmonary effort is normal.     Breath sounds: No stridor. No wheezing, rhonchi or rales.  Abdominal:     General: Abdomen is flat.     Palpations: There is no mass.     Tenderness: There is no abdominal tenderness. There is no guarding.     Hernia: No hernia is present.  Musculoskeletal:        General: Normal range of motion.     Cervical back: Neck supple.     Right lower leg: No edema.     Left lower leg: No edema.  Skin:    General: Skin is warm and dry.     Coloration: Skin is not pale.  Neurological:     General: No focal deficit present.     Mental Status: She is alert. Mental status is at baseline.  Psychiatric:        Mood and Affect: Mood normal.     Lab Results  Component Value Date   WBC 6.7 05/09/2023   HGB 13.1 05/09/2023   HCT 39.1 05/09/2023   PLT 293.0 05/09/2023   GLUCOSE 92 05/09/2023   CHOL 179 01/10/2018   TRIG 134 01/10/2018   HDL 49 01/10/2018   LDLCALC 103 (H) 01/10/2018   ALT 11 05/09/2023   AST 17 05/09/2023   NA 136 05/09/2023   K 3.8 05/09/2023   CL 101 05/09/2023   CREATININE 0.70 05/09/2023   BUN 5 (L) 05/09/2023   CO2 28 05/09/2023   TSH 1.09 05/09/2023   HGBA1C 5.0 10/12/2019    MM 3D DIAGNOSTIC MAMMOGRAM UNILATERAL LEFT BREAST Result Date: 02/19/2023 CLINICAL DATA:  Patient  presents reporting a palpable lump with associated discomfort, that waxes and wanes. Lump is currently smaller than it was a few weeks ago. Patient was seen for this lump on 02/07/2022 and underwent a subsequent ultrasound-guided core needle biopsy of a mixed echogenicity lesion at 7 o'clock. Pathology revealed florid adenosis in the background of fibrocystic changes including fibrosis and apocrine metaplasia stoma which was concordant. EXAM: DIGITAL DIAGNOSTIC UNILATERAL LEFT MAMMOGRAM WITH TOMOSYNTHESIS AND CAD; ULTRASOUND LEFT BREAST LIMITED TECHNIQUE: Left digital diagnostic mammography and breast tomosynthesis was performed. The images  were evaluated with computer-aided detection. ; Targeted ultrasound examination of the left breast was performed. COMPARISON:  Previous exam(s). ACR Breast Density Category c: The breasts are heterogeneously dense, which may obscure small masses. FINDINGS: There are 3 post biopsy marker clips in the medial left breast, 2 clips that lie at 9-10 o'clock, and the more recent ribbon clip that lies in the lower inner breast. These are stable from the post clip mammograms dated 09/24/2022. There are no defined breast masses, areas architectural distortion or suspicious calcifications. On physical exam, there is a small mobile firm palpable nodule in the left breast near 7 o'clock. Targeted ultrasound is performed, showing a mixed echogenicity, but predominantly hyperechoic, ill-defined mass/lesion in the left breast at 7 o'clock, 2 cm the nipple, measuring approximately 1.4 x 0.6 x 1.3 cm, without significant change from the prior exams. This is the lesion that was previously biopsied. IMPRESSION: 1. No evidence of breast malignancy. 2. Biopsy-proven benign, 1.4 cm mass/lesion in the left breast at 7 o'clock corresponding to palpable lump and area of focal pain. RECOMMENDATION: 1. Annual screening mammography. Last screening study performed on 09/24/2022. I have discussed the findings  and recommendations with the patient. If applicable, a reminder letter will be sent to the patient regarding the next appointment. BI-RADS CATEGORY  2: Benign. Electronically Signed   By: Amie Portland M.D.   On: 02/19/2023 09:41   [place] LIMITED ULTRASOUND INCLUDING AXILLA LEFT BREAST  Result Date: 02/19/2023 CLINICAL DATA:  Patient presents reporting a palpable lump with associated discomfort, that waxes and wanes. Lump is currently smaller than it was a few weeks ago. Patient was seen for this lump on 02/07/2022 and underwent a subsequent ultrasound-guided core needle biopsy of a mixed echogenicity lesion at 7 o'clock. Pathology revealed florid adenosis in the background of fibrocystic changes including fibrosis and apocrine metaplasia stoma which was concordant. EXAM: DIGITAL DIAGNOSTIC UNILATERAL LEFT MAMMOGRAM WITH TOMOSYNTHESIS AND CAD; ULTRASOUND LEFT BREAST LIMITED TECHNIQUE: Left digital diagnostic mammography and breast tomosynthesis was performed. The images were evaluated with computer-aided detection. ; Targeted ultrasound examination of the left breast was performed. COMPARISON:  Previous exam(s). ACR Breast Density Category c: The breasts are heterogeneously dense, which may obscure small masses. FINDINGS: There are 3 post biopsy marker clips in the medial left breast, 2 clips that lie at 9-10 o'clock, and the more recent ribbon clip that lies in the lower inner breast. These are stable from the post clip mammograms dated 09/24/2022. There are no defined breast masses, areas architectural distortion or suspicious calcifications. On physical exam, there is a small mobile firm palpable nodule in the left breast near 7 o'clock. Targeted ultrasound is performed, showing a mixed echogenicity, but predominantly hyperechoic, ill-defined mass/lesion in the left breast at 7 o'clock, 2 cm the nipple, measuring approximately 1.4 x 0.6 x 1.3 cm, without significant change from the prior exams. This is the lesion  that was previously biopsied. IMPRESSION: 1. No evidence of breast malignancy. 2. Biopsy-proven benign, 1.4 cm mass/lesion in the left breast at 7 o'clock corresponding to palpable lump and area of focal pain. RECOMMENDATION: 1. Annual screening mammography. Last screening study performed on 09/24/2022. I have discussed the findings and recommendations with the patient. If applicable, a reminder letter will be sent to the patient regarding the next appointment. BI-RADS CATEGORY  2: Benign. Electronically Signed   By: Amie Portland M.D.   On: 02/19/2023 09:41    Assessment & Plan:  Need for hepatitis C screening test -  Hepatitis C antibody; Future  Encounter for screening for HIV -     HIV Antibody (routine testing w rflx); Future  Encounter for general adult medical examination with abnormal findings - Exam completed, labs reviewed, vaccines reviewed, cancer screenings are UTD, pt ed material was given.  -     Hepatitis C antibody; Future -     HIV Antibody (routine testing w rflx); Future  Chronic idiopathic constipation- Labs are negative for secondary causes. -     TSH; Future -     Hepatic function panel; Future -     Cortisol; Future -     Basic metabolic panel; Future  Idiopathic hypotension- I recommended that she start taking a mineralocorticoid. -     CBC with Differential/Platelet; Future -     TSH; Future -     Urinalysis, Routine w reflex microscopic; Future -     Hepatic function panel; Future -     Basic metabolic panel; Future -     EKG 12-Lead -     Fludrocortisone Acetate; Take 1 tablet (0.1 mg total) by mouth daily.  Dispense: 90 tablet; Refill: 1  Bradycardia- She is asymptomatic with this.     Follow-up: Return in about 6 months (around 11/06/2023).  Sanda Linger, MD

## 2023-05-10 ENCOUNTER — Encounter: Payer: Self-pay | Admitting: Internal Medicine

## 2023-05-10 LAB — HEPATITIS C ANTIBODY: Hepatitis C Ab: NONREACTIVE

## 2023-05-10 LAB — HIV ANTIBODY (ROUTINE TESTING W REFLEX): HIV 1&2 Ab, 4th Generation: NONREACTIVE

## 2023-05-13 ENCOUNTER — Telehealth: Payer: Self-pay | Admitting: Neurology

## 2023-05-13 ENCOUNTER — Other Ambulatory Visit (HOSPITAL_COMMUNITY): Payer: Self-pay

## 2023-05-13 DIAGNOSIS — F4389 Other reactions to severe stress: Secondary | ICD-10-CM | POA: Diagnosis not present

## 2023-05-13 NOTE — Telephone Encounter (Signed)
Reached out ot [place] with the PA team to get the updated PA for total amount of Botox

## 2023-05-13 NOTE — Telephone Encounter (Signed)
Pt called to see if she is able to get her botox sooner than her original appt which is in feb. She states her and tat talked about this

## 2023-05-14 ENCOUNTER — Other Ambulatory Visit (HOSPITAL_COMMUNITY): Payer: Self-pay

## 2023-05-14 DIAGNOSIS — F4389 Other reactions to severe stress: Secondary | ICD-10-CM | POA: Diagnosis not present

## 2023-05-14 NOTE — Telephone Encounter (Signed)
Pharmacy Patient Advocate Encounter  Received notification from [employer] that Prior Authorization for XEOMIN 50 has been CANCELLED due to Previous authorization on file will cover the total amout needed of 250u. It covers the medications at whatever amount is needed until 12/27/23. per Rep Beth N.

## 2023-05-14 NOTE — Telephone Encounter (Signed)
Pt called in wanting to get an update on the PA with her botox.

## 2023-05-14 NOTE — Telephone Encounter (Signed)
Called pateint left detailed message that I am waiting for the PA team

## 2023-05-20 DIAGNOSIS — F4389 Other reactions to severe stress: Secondary | ICD-10-CM | POA: Diagnosis not present

## 2023-05-23 ENCOUNTER — Other Ambulatory Visit: Payer: Self-pay | Admitting: Internal Medicine

## 2023-05-23 DIAGNOSIS — M47812 Spondylosis without myelopathy or radiculopathy, cervical region: Secondary | ICD-10-CM

## 2023-05-23 MED ORDER — MELOXICAM 15 MG PO TABS: 15.0000 mg | ORAL_TABLET | Freq: Every day | ORAL | 0 refills | Status: DC

## 2023-05-23 MED ORDER — OXYCODONE HCL 5 MG PO TABS: 5.0000 mg | ORAL_TABLET | Freq: Every day | ORAL | 0 refills | Status: DC | PRN

## 2023-05-23 NOTE — Telephone Encounter (Signed)
 Copied from CRM [id number]. Topic: Clinical - Medication Refill >> May 23, 2023 11:12 AM Laymon HERO wrote: Most Recent Primary Care Visit:  Provider: JOSHUA DEBBY CROME  Department: [facility]  Visit Type: PHYSICAL  Date: 05/09/2023  Medication: meloxicam , oxyCODONE  (OXY IR/ROXICODONE ) 5 MG immediate release tablet  Has the patient contacted their pharmacy? Yes (Agent: If no, request that the patient contact the pharmacy for the refill. If patient does not wish to contact the pharmacy document the reason why and proceed with request.) (Agent: If yes, when and what did the pharmacy advise?)  Is this the correct pharmacy for this prescription? Yes If no, delete pharmacy and type the correct one.  This is the patient's preferred pharmacy:  [facility] [id number] [place], [place] - [address] [place] [address] Phone: [phone] Fax: [phone]   Has the prescription been filled recently? Yes  Is the patient out of the medication? Yes  Has the patient been seen for an appointment in the last year OR does the patient have an upcoming appointment? Yes  Can we respond through MyChart? Yes  Agent: Please be advised that Rx refills may take up to 3 business days. We ask that you follow-up with your pharmacy.

## 2023-05-24 ENCOUNTER — Ambulatory Visit: Payer: 59 | Admitting: Neurology

## 2023-05-24 DIAGNOSIS — G243 Spasmodic torticollis: Secondary | ICD-10-CM

## 2023-05-24 MED ORDER — INCOBOTULINUMTOXINA 50 UNITS IM SOLR
50.0000 [IU] | INTRAMUSCULAR | Status: AC
  Administered 2023-05-24: 50 [IU] via INTRAMUSCULAR

## 2023-05-24 MED ORDER — INCOBOTULINUMTOXINA 100 UNITS IM SOLR
200.0000 [IU] | INTRAMUSCULAR | Status: AC
  Administered 2023-05-24: 200 [IU] via INTRAMUSCULAR

## 2023-05-24 NOTE — Procedures (Signed)
 [facility]   Procedure Note Botox   Attending: Dr. Asberry Laiza Veenstra  Preoperative Diagnosis(es): Cervical Dystonia  Result History  Improved but wore off so moving to q 3 months and increasing dose today  Consent obtained from: The patient Benefits discussed included, but were not limited to decreased muscle tightness, increased joint range of motion, and decreased pain.  Risk discussed included, but were not limited pain and discomfort, bleeding, bruising, excessive weakness, venous thrombosis, muscle atrophy and dysphagia.  A copy of the patient medication guide was given to the patient which explains the blackbox warning.  Patients identity and treatment sites confirmed Yes.  .  Details of Procedure: Skin was cleaned with alcohol .  A 30 gauge, 25mm  needle was introduced to the target muscle, except for posterior splenius where 27 gauge, 1.5 inch needle used.   Prior to injection, the needle plunger was aspirated to make sure the needle was not within a blood vessel.  There was no blood retrieved on aspiration.    Following is a summary of the muscles injected  And the amount of incobotulinumtoxin A used:   Dilution 0.9% preservative free saline mixed with 100 u incobotulinumtoxin A to make 10 U per 0.1cc  Injections  Location Left  Right Units Number of sites        Sternocleidomastoid 60  60 1  Splenius Capitus, posterior approach  100 100 1  Splenius Capitus, lateral approach  30 30 1   Levator Scapulae      Trapezius 20/10 20/10 60         TOTAL UNITS:   250    Agent: Incobotulinumtoxin A (Xeomin ) 3 vials of incobotulinumtoxinA  (one 50 unit and two 100 unit vials) and were used, each diluted with sterile, preservative-free saline to make 1unit/1cc  Total injected (Units): 250  Total wasted (Units): none wasted   Pt tolerated procedure well without complications.   Reinjection is anticipated in 3 months.

## 2023-05-27 DIAGNOSIS — F4389 Other reactions to severe stress: Secondary | ICD-10-CM | POA: Diagnosis not present

## 2023-06-03 DIAGNOSIS — F4389 Other reactions to severe stress: Secondary | ICD-10-CM | POA: Diagnosis not present

## 2023-06-04 ENCOUNTER — Encounter: Payer: Self-pay | Admitting: Gastroenterology

## 2023-06-07 ENCOUNTER — Ambulatory Visit: Payer: 59 | Admitting: Neurology

## 2023-06-10 DIAGNOSIS — F4389 Other reactions to severe stress: Secondary | ICD-10-CM | POA: Diagnosis not present

## 2023-06-17 DIAGNOSIS — F4389 Other reactions to severe stress: Secondary | ICD-10-CM | POA: Diagnosis not present

## 2023-06-18 ENCOUNTER — Other Ambulatory Visit: Payer: Self-pay | Admitting: Internal Medicine

## 2023-06-18 ENCOUNTER — Telehealth: Payer: Self-pay | Admitting: Neurology

## 2023-06-18 DIAGNOSIS — M47812 Spondylosis without myelopathy or radiculopathy, cervical region: Secondary | ICD-10-CM

## 2023-06-18 MED ORDER — MELOXICAM 15 MG PO TABS: 15.0000 mg | ORAL_TABLET | Freq: Every day | ORAL | 0 refills | Status: DC

## 2023-06-18 MED ORDER — OXYCODONE HCL 5 MG PO TABS: 5.0000 mg | ORAL_TABLET | Freq: Every day | ORAL | 0 refills | Status: DC | PRN

## 2023-06-18 NOTE — Telephone Encounter (Signed)
 Copied from CRM [id number]. Topic: Clinical - Medication Refill >> Jun 18, 2023 10:19 AM Kathryne Eriksson wrote: Most Recent Primary Care Visit:  Provider: Etta Grandchild  Department: [facility]  Visit Type: PHYSICAL  Date: 05/09/2023  Medication: oxyCODONE (OXY IR/ROXICODONE) 5 MG immediate release tablet , meloxicam (MOBIC) 15 MG tablet  Has the patient contacted their pharmacy? Yes (Agent: If no, request that the patient contact the pharmacy for the refill. If patient does not wish to contact the pharmacy document the reason why and proceed with request.) (Agent: If yes, when and what did the pharmacy advise?)  Is this the correct pharmacy for this prescription? Yes If no, delete pharmacy and type the correct one.  This is the patient's preferred pharmacy:  [facility] [id number] [place], [place] - [address] [place] [address] Phone: [phone] Fax: [phone]   Has the prescription been filled recently? No  Is the patient out of the medication? No  Has the patient been seen for an appointment in the last year OR does the patient have an upcoming appointment? Yes  Can we respond through MyChart? Yes  Agent: Please be advised that Rx refills may take up to 3 business days. We ask that you follow-up with your pharmacy.

## 2023-06-18 NOTE — Telephone Encounter (Signed)
 Dawn Jackson Therapeutic cld as paper work is not complete would need some more information to process the pt saving application, needs a call back to go over missing info

## 2023-06-18 NOTE — Telephone Encounter (Signed)
 Requested Prescriptions   Pending Prescriptions Disp Refills   oxyCODONE (OXY IR/ROXICODONE) 5 MG immediate release tablet 30 tablet 0    Sig: Take 1 tablet (5 mg total) by mouth daily as needed for breakthrough pain.   meloxicam (MOBIC) 15 MG tablet 90 tablet 0    Sig: Take 1 tablet (15 mg total) by mouth daily.     Date of patient request: 06/18/2023 Last office visit: 05/09/2023 Upcoming visit: Visit date not found Date of last refill: 05/23/2023 Last refill amount: 30, 90

## 2023-06-18 NOTE — Telephone Encounter (Signed)
 Continued... Rx name is [email or web address]

## 2023-06-21 ENCOUNTER — Telehealth: Payer: Self-pay | Admitting: Neurology

## 2023-06-21 NOTE — Telephone Encounter (Signed)
 Called patient and she stated that It's been 4 weeks and patient states that maybe in the last couple days she sees an improvement in the movement. Still shaking a little but doing better.

## 2023-06-21 NOTE — Telephone Encounter (Signed)
 I increased her botox dosage last visit.  I need to know how she did with that so we know if we are going to continue that.

## 2023-06-21 NOTE — Telephone Encounter (Signed)
 Called patient and informed her of D. Tat's recommendations and advice below. Patient does not want to stop and is wondering if an adjustment can be done to dosage or a change in medicine?

## 2023-06-24 DIAGNOSIS — F4389 Other reactions to severe stress: Secondary | ICD-10-CM | POA: Diagnosis not present

## 2023-06-25 ENCOUNTER — Encounter: Payer: Self-pay | Admitting: Neurology

## 2023-06-25 NOTE — Telephone Encounter (Signed)
 Called pateint and left detailed message

## 2023-07-03 DIAGNOSIS — F4389 Other reactions to severe stress: Secondary | ICD-10-CM | POA: Diagnosis not present

## 2023-07-08 DIAGNOSIS — F4389 Other reactions to severe stress: Secondary | ICD-10-CM | POA: Diagnosis not present

## 2023-07-12 NOTE — Telephone Encounter (Signed)
 Not sure what else is needed or what number to call. Please provide this information if this request is still needed.

## 2023-07-15 DIAGNOSIS — F4389 Other reactions to severe stress: Secondary | ICD-10-CM | POA: Diagnosis not present

## 2023-07-15 NOTE — Telephone Encounter (Signed)
 Called Dawn Jackson back and she is in meeting and will call me back in about 15 min

## 2023-07-16 ENCOUNTER — Other Ambulatory Visit: Payer: Self-pay

## 2023-07-18 DIAGNOSIS — F1910 Other psychoactive substance abuse, uncomplicated: Secondary | ICD-10-CM | POA: Diagnosis not present

## 2023-07-18 DIAGNOSIS — F411 Generalized anxiety disorder: Secondary | ICD-10-CM | POA: Diagnosis not present

## 2023-07-18 DIAGNOSIS — F332 Major depressive disorder, recurrent severe without psychotic features: Secondary | ICD-10-CM | POA: Diagnosis not present

## 2023-07-19 ENCOUNTER — Other Ambulatory Visit (HOSPITAL_COMMUNITY): Payer: Self-pay

## 2023-07-22 ENCOUNTER — Other Ambulatory Visit (HOSPITAL_COMMUNITY): Payer: Self-pay

## 2023-07-22 ENCOUNTER — Other Ambulatory Visit: Payer: Self-pay | Admitting: Internal Medicine

## 2023-07-22 ENCOUNTER — Telehealth: Payer: Self-pay | Admitting: Pharmacy Technician

## 2023-07-22 DIAGNOSIS — M47812 Spondylosis without myelopathy or radiculopathy, cervical region: Secondary | ICD-10-CM

## 2023-07-22 NOTE — Telephone Encounter (Signed)
 Copied from CRM [id number]. Topic: Clinical - Medication Refill >> Jul 22, 2023 10:59 AM Eunice Blase wrote: Most Recent Primary Care Visit:  Provider: Etta Grandchild  Department: [facility] [place]  Visit Type: PHYSICAL  Date: 05/09/2023  Medication: meloxicam (MOBIC) 15 MG tablet  Has the patient contacted their pharmacy? Yes (Agent: If no, request that the patient contact the pharmacy for the refill. If patient does not wish to contact the pharmacy document the reason why and proceed with request.) (Agent: If yes, when and what did the pharmacy advise?)Pharmacy need PCP approval  Is this the correct pharmacy for this prescription? Yes If no, delete pharmacy and type the correct one.  This is the patient's preferred pharmacy:  [facility] [id number] [place], [place] - [address] [place] [address] Phone: [phone] Fax: [phone]   Has the prescription been filled recently? Yes  Is the patient out of the medication? Yes  Has the patient been seen for an appointment in the last year OR does the patient have an upcoming appointment? Yes  Can we respond through MyChart? Yes  Agent: Please be advised that Rx refills may take up to 3 business days. We ask that you follow-up with your pharmacy.

## 2023-07-22 NOTE — Telephone Encounter (Signed)
 Copied from CRM [id number]. Topic: Clinical - Medication Refill >> Jul 22, 2023 10:57 AM Eunice Blase wrote: Most Recent Primary Care Visit:  Provider: Etta Grandchild  Department: [facility] [place]  Visit Type: PHYSICAL  Date: 05/09/2023  Medication: oxyCODONE (OXY IR/ROXICODONE) 5 MG immediate release tablet  Has the patient contacted their pharmacy? Yes (Agent: If no, request that the patient contact the pharmacy for the refill. If patient does not wish to contact the pharmacy document the reason why and proceed with request.) (Agent: If yes, when and what did the pharmacy advise?)Pharmacy need PCP approval  Is this the correct pharmacy for this prescription? Yes If no, delete pharmacy and type the correct one.  This is the patient's preferred pharmacy:  [facility] [id number] [place], [place] - [address] [place] [address] Phone: [phone] Fax: [phone]   Has the prescription been filled recently? Yes  Is the patient out of the medication? Yes  Has the patient been seen for an appointment in the last year OR does the patient have an upcoming appointment? Yes  Can we respond through MyChart? Yes  Agent: Please be advised that Rx refills may take up to 3 business days. We ask that you follow-up with your pharmacy.

## 2023-07-22 NOTE — Telephone Encounter (Signed)
 Pharmacy Patient Advocate Encounter   BotoxOne verification has been Submitted Benefit Verification #:   [id number]   Dx Code: 54098 J-code: J1914 Procedure code: G24.3  Pharmacy Benefit PA has been submitted for Botox via CoverMyMeds.  INSURANCE: [employer] KEY/EOC/FAX: [id number] Status is Pending

## 2023-07-22 NOTE — Telephone Encounter (Signed)
 PA has been submitted, and telephone encounter has been created. Please see telephone encounter dated 4.7.25.

## 2023-07-23 ENCOUNTER — Other Ambulatory Visit (HOSPITAL_COMMUNITY): Payer: Self-pay

## 2023-07-23 ENCOUNTER — Other Ambulatory Visit: Payer: Self-pay

## 2023-07-23 MED ORDER — BOTOX 100 UNITS IJ SOLR: INTRAMUSCULAR | 1 refills | Status: DC

## 2023-07-23 MED ORDER — MELOXICAM 15 MG PO TABS: 15.0000 mg | ORAL_TABLET | Freq: Every day | ORAL | 0 refills | Status: DC

## 2023-07-23 MED ORDER — OXYCODONE HCL 5 MG PO TABS
5.0000 mg | ORAL_TABLET | Freq: Every day | ORAL | 0 refills | Status: DC | PRN
Start: 2023-07-23 — End: 2023-08-21

## 2023-07-23 NOTE — Telephone Encounter (Signed)
 Pharmacy Patient Advocate Encounter- Botox BIV-Pharmacy Benefit:  PA was submitted to [employer] and has been approved through: 4.7.25 TO 4.6.26 Authorization# [id number]  Please send prescription to Specialty Pharmacy: [facility]: [phone] Estimated Copay is: ?  Patient IS eligible for Botox Copay Card, which will make patient's copay as little as zero. Copay card will be provided to pharmacy.

## 2023-07-26 NOTE — Telephone Encounter (Signed)
 Pharmacy Patient Advocate Encounter  Botox One Portal verification has been completed.  Benefit Verification #:  [id number]    Dx Code: G24.3  J-code: Botox- N6295 does require PA Procedure code: 28413 does not require PA  Primary Insurance: [employer] Patient estimated coinsurance: 20% Patient's remaining deductible: $1495.00  *Benefit info changes as patient continues to use their insurance benefits*

## 2023-07-31 ENCOUNTER — Ambulatory Visit: Admitting: Dermatology

## 2023-07-31 ENCOUNTER — Encounter: Payer: Self-pay | Admitting: Dermatology

## 2023-07-31 VITALS — BP 117/76 | HR 64

## 2023-07-31 DIAGNOSIS — Z85828 Personal history of other malignant neoplasm of skin: Secondary | ICD-10-CM

## 2023-07-31 DIAGNOSIS — W908XXA Exposure to other nonionizing radiation, initial encounter: Secondary | ICD-10-CM

## 2023-07-31 DIAGNOSIS — L570 Actinic keratosis: Secondary | ICD-10-CM

## 2023-07-31 NOTE — Patient Instructions (Signed)
 Important Information  Due to recent changes in healthcare laws, you may see results of your pathology and/or laboratory studies on MyChart before the doctors have had a chance to review them. We understand that in some cases there may be results that are confusing or concerning to you. Please understand that not all results are received at the same time and often the doctors may need to interpret multiple results in order to provide you with the best plan of care or course of treatment. Therefore, we ask that you please give [place] 2 business days to thoroughly review all your results before contacting the office for clarification. Should we see a critical lab result, you will be contacted sooner.   If You Need Anything After Your Visit  If you have any questions or concerns for your doctor, please call our main line at [phone] If no one answers, please leave a voicemail as directed and we will return your call as soon as possible. Messages left after 4 pm will be answered the following business day.   You may also send [place] a message via MyChart. We typically respond to MyChart messages within 1-2 business days.  For prescription refills, please ask your pharmacy to contact our office. Our fax number is [phone].  If you have an urgent issue when the clinic is closed that cannot wait until the next business day, you can page your doctor at the number below.    Please note that while we do our best to be available for urgent issues outside of office hours, we are not available 24/7.   If you have an urgent issue and are unable to reach [place], you may choose to seek medical care at your doctor's office, retail clinic, urgent care center, or emergency room.  If you have a medical emergency, please immediately call 911 or go to the emergency department. In the event of inclement weather, please call our main line at [phone] for an update on the status of any delays or  closures.  Dermatology Medication Tips: Please keep the boxes that topical medications come in in order to help keep track of the instructions about where and how to use these. Pharmacies typically print the medication instructions only on the boxes and not directly on the medication tubes.   If your medication is too expensive, please contact our office at [phone] or send [place] a message through MyChart.   We are unable to tell what your co-pay for medications will be in advance as this is different depending on your insurance coverage. However, we may be able to find a substitute medication at lower cost or fill out paperwork to get insurance to cover a needed medication.   If a prior authorization is required to get your medication covered by your insurance company, please allow [place] 1-2 business days to complete this process.  Drug prices often vary depending on where the prescription is filled and some pharmacies may offer cheaper prices.  The website www.goodrx.com contains coupons for medications through different pharmacies. The prices here do not account for what the cost may be with help from insurance (it may be cheaper with your insurance), but the website can give you the price if you did not use any insurance.  - You can print the associated coupon and take it with your prescription to the pharmacy.  - You may also stop by our office during regular business hours and pick up a GoodRx coupon card.  - If  you need your prescription sent electronically to a different pharmacy, notify our office through [facility] or by phone at [phone]    Skin Education :   I counseled the patient regarding the following: Sun screen (SPF 30 or greater) should be applied during peak UV exposure (between 10am and 2pm) and reapplied after exercise or swimming.  The ABCDEs of melanoma were reviewed with the patient, and the importance of monthly self-examination of moles was emphasized.  Should any moles change in shape or color, or itch, bleed or burn, pt will contact our office for evaluation sooner then their interval appointment.  Plan: Sunscreen Recommendations I recommended a broad spectrum sunscreen with a SPF of 30 or higher. I explained that SPF 30 sunscreens block approximately 97 percent of the sun's harmful rays. Sunscreens should be applied at least 15 minutes prior to expected sun exposure and then every 2 hours after that as long as sun exposure continues. If swimming or exercising sunscreen should be reapplied every 45 minutes to an hour after getting wet or sweating. One ounce, or the equivalent of a shot glass full of sunscreen, is adequate to protect the skin not covered by a bathing suit. I also recommended a lip balm with a sunscreen as well. Sun protective clothing can be used in lieu of sunscreen but must be worn the entire time you are exposed to the sun's rays. For areas treated with Liquid Nitrogen:  Keep clean with soap and water.  Apply Vaseline or Aquaphor twice daily.

## 2023-07-31 NOTE — Progress Notes (Unsigned)
   New Patient Visit   Subjective  Dawn Jackson is a 53 y.o. female who presents for the following: Spot on face of concern. Hx of BCC. Family hx of SCC.  The patient has spots, moles and lesions to be evaluated, some may be new or changing and the patient may have concern these could be cancer.   The following portions of the chart were reviewed this encounter and updated as appropriate: medications, allergies, medical history  Review of Systems:  No other skin or systemic complaints except as noted in HPI or Assessment and Plan.  Objective  Well appearing patient in no apparent distress; mood and affect are within normal limits.   A focused examination was performed of the following areas: Face  Relevant exam findings are noted in the Assessment and Plan.    Assessment & Plan   ACTINIC KERATOSIS Exam: Erythematous thin papules/macules with gritty scale  Actinic keratoses are precancerous spots that appear secondary to cumulative UV radiation exposure/sun exposure over time. They are chronic with expected duration over 1 year. A portion of actinic keratoses will progress to squamous cell carcinoma of the skin. It is not possible to reliably predict which spots will progress to skin cancer and so treatment is recommended to prevent development of skin cancer.  Recommend daily broad spectrum sunscreen SPF 30+ to sun-exposed areas, reapply every 2 hours as needed.  Recommend staying in the shade or wearing long sleeves, sun glasses (UVA+UVB protection) and wide brim hats (4-inch brim around the entire circumference of the hat). Call for new or changing lesions.  Treatment Plan:  Prior to procedure, discussed risks of blister formation, small wound, skin dyspigmentation, or rare scar following cryotherapy. Recommend Vaseline ointment to treated areas while healing.  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  - Call if any new or changing lesions are noted between office visits  HISTORY OF BASAL CELL CARCINOMA (BCC) Left Malar Cheek AK (ACTINIC KERATOSIS) Right Nasal Sidewall Destruction of lesion - Right Nasal Sidewall Complexity: simple   Destruction method: cryotherapy   Informed consent: discussed and consent obtained   Timeout:  patient name, date of birth, surgical site, and procedure verified Lesion destroyed using liquid nitrogen: Yes   Region frozen until ice ball extended beyond lesion: Yes   Cryotherapy cycles:  2 Outcome: patient tolerated procedure well with no complications   Post-procedure details: wound care instructions given    Return if symptoms worsen or fail to improve or 6wks and TBSE with Cornelius Dill.  I, Haig Levan, Surg Tech III, am acting as scribe for Deneise Finlay, MD.   Documentation: I have reviewed the above documentation for accuracy and completeness, and I agree with the above.  Deneise Finlay, MD

## 2023-08-16 ENCOUNTER — Other Ambulatory Visit: Payer: Self-pay

## 2023-08-16 MED ORDER — BOTOX 100 UNITS IJ SOLR: INTRAMUSCULAR | 1 refills | Status: DC

## 2023-08-21 ENCOUNTER — Telehealth: Payer: Self-pay | Admitting: Gastroenterology

## 2023-08-21 ENCOUNTER — Other Ambulatory Visit: Payer: Self-pay | Admitting: Internal Medicine

## 2023-08-21 DIAGNOSIS — M47812 Spondylosis without myelopathy or radiculopathy, cervical region: Secondary | ICD-10-CM

## 2023-08-21 NOTE — Telephone Encounter (Signed)
 Last Fill: 07/23/23 30 tabs/0 RF  Last OV: 05/09/23 Next OV: None Scheduled  Routing to provider for review/authorization.

## 2023-08-21 NOTE — Telephone Encounter (Signed)
 I have no issues with consideration of transfer of care. It is up to Dr. Yvone Herd to decide. GM

## 2023-08-21 NOTE — Telephone Encounter (Signed)
 Good afternoon Dr. Brice Campi,   Patient is requesting to transfer her care over to Dr. Yvone Herd for recall colonoscopy. Stated she would like to transfer to a female provider and a provider that has Monday's open for colonoscopy in June. Please review and advise on scheduling.   Thank you.

## 2023-08-21 NOTE — Telephone Encounter (Signed)
 Copied from CRM [id number]. Topic: Clinical - Medication Refill >> Aug 21, 2023  2:52 PM Dawn Jackson wrote: Medication: oxyCODONE  (OXY IR/ROXICODONE ) 5 MG immediate release tablet  Has the patient contacted their pharmacy? Yes- PCP has to call as it a controlled substance  (Agent: If no, request that the patient contact the pharmacy for the refill. If patient does not wish to contact the pharmacy document the reason why and proceed with request.) (Agent: If yes, when and what did the pharmacy advise?)  This is the patient's preferred pharmacy:  [facility] [id number] [place], [place] - [address] [place] [address] Phone: [phone] Fax: [phone]  Is this the correct pharmacy for this prescription? Yes If no, delete pharmacy and type the correct one.   Has the prescription been filled recently? No  Is the patient out of the medication? Yes  Has the patient been seen for an appointment in the last year OR does the patient have an upcoming appointment? Yes  Can we respond through MyChart? Yes  Agent: Please be advised that Rx refills may take up to 3 business days. We ask that you follow-up with your pharmacy.

## 2023-08-26 ENCOUNTER — Encounter: Payer: Self-pay | Admitting: Pediatrics

## 2023-08-26 MED ORDER — OXYCODONE HCL 5 MG PO TABS
5.0000 mg | ORAL_TABLET | Freq: Every day | ORAL | 0 refills | Status: DC | PRN
Start: 2023-08-26 — End: 2023-09-18

## 2023-08-26 NOTE — Telephone Encounter (Signed)
 Copied from CRM [id number]. Topic: Clinical - Medication Refill >> Aug 21, 2023  2:52 PM Dawn Jackson wrote: Medication: oxyCODONE  (OXY IR/ROXICODONE ) 5 MG immediate release tablet  Has the patient contacted their pharmacy? Yes- PCP has to call as it a controlled substance  (Agent: If no, request that the patient contact the pharmacy for the refill. If patient does not wish to contact the pharmacy document the reason why and proceed with request.) (Agent: If yes, when and what did the pharmacy advise?)  This is the patient's preferred pharmacy:  [facility] [id number] [place], [place] - [address] [place] [address] Phone: [phone] Fax: [phone]  Is this the correct pharmacy for this prescription? Yes If no, delete pharmacy and type the correct one.   Has the prescription been filled recently? No  Is the patient out of the medication? Yes  Has the patient been seen for an appointment in the last year OR does the patient have an upcoming appointment? Yes  Can we respond through MyChart? Yes  Agent: Please be advised that Rx refills may take up to 3 business days. We ask that you follow-up with your pharmacy. >> Aug 26, 2023  1:43 PM Dawn Jackson wrote: Pt called stated pharmacy has not received refill for oxyCODONE  (OXY IR/ROXICODONE ) 5 MG immediate release tablet. Please call [phone] pt for status update.

## 2023-08-27 DIAGNOSIS — F4389 Other reactions to severe stress: Secondary | ICD-10-CM | POA: Diagnosis not present

## 2023-09-03 ENCOUNTER — Telehealth: Payer: Self-pay | Admitting: Neurology

## 2023-09-03 NOTE — Telephone Encounter (Signed)
 Pt. [facility] needs diagnosis code for shipment of order

## 2023-09-03 NOTE — Telephone Encounter (Signed)
 done

## 2023-09-04 DIAGNOSIS — G243 Spasmodic torticollis: Secondary | ICD-10-CM | POA: Diagnosis not present

## 2023-09-06 ENCOUNTER — Ambulatory Visit: Payer: 59 | Admitting: Neurology

## 2023-09-06 ENCOUNTER — Ambulatory Visit: Admitting: Neurology

## 2023-09-06 DIAGNOSIS — G243 Spasmodic torticollis: Secondary | ICD-10-CM

## 2023-09-06 MED ORDER — ONABOTULINUMTOXINA 100 UNITS IJ SOLR
300.0000 [IU] | Freq: Once | INTRAMUSCULAR | Status: AC
  Administered 2023-09-06: 250 [IU] via INTRAMUSCULAR

## 2023-09-06 NOTE — Addendum Note (Signed)
 Addended by: Ortencia Blamer C on: 09/06/2023 11:11 AM   Modules accepted: Orders

## 2023-09-06 NOTE — Procedures (Addendum)
 [facility]   Procedure Note Botox   Attending: Dr. Asberry Lanya Bucks  Preoperative Diagnosis(es): Cervical Dystonia  Result History  returned back to onobotulinum toxin A, as she was not sure Xeomin  was helping as much.  She is having more tremor.  She asks about the bilateral nature of the injections (she thought we are doing the same muscles on both sides) as when she went to [facility] they were not doing that.  I told her that while we do muscles on both sides, they are not the same muscles.  Consent obtained from: The patient Benefits discussed included, but were not limited to decreased muscle tightness, increased joint range of motion, and decreased pain.  Risk discussed included, but were not limited pain and discomfort, bleeding, bruising, excessive weakness, venous thrombosis, muscle atrophy and dysphagia.  A copy of the patient medication guide was given to the patient which explains the blackbox warning.  Patients identity and treatment sites confirmed Yes.  .  Details of Procedure: Skin was cleaned with alcohol .  A 30 gauge, 25mm  needle was introduced to the target muscle, except for posterior splenius where 27 gauge, 1.5 inch needle used.   Prior to injection, the needle plunger was aspirated to make sure the needle was not within a blood vessel.  There was no blood retrieved on aspiration.    Following is a summary of the muscles injected  And the amount of onabotulinum toxin A used:   Dilution 0.9% preservative free saline mixed with 100 u onobotulinumtoxin A to make 10 U per 0.1cc  Injections  Location Left  Right Units Number of sites        Sternocleidomastoid 60  60 1  Splenius Capitus, posterior approach  100 100 1  Splenius Capitus, lateral approach  30 30 1   Levator Scapulae      Trapezius 20/10 20/10 60         TOTAL UNITS:   250    Agent: Onabotulinum toxin A  3 vials of onabotulinum toxin A  and were used, each diluted with sterile, preservative-free  saline to make 1unit/1cc  Total injected (Units): 250  Total wasted (Units): 50 units   Pt tolerated procedure well without complications.   Reinjection is anticipated in 3 months.

## 2023-09-10 DIAGNOSIS — F4389 Other reactions to severe stress: Secondary | ICD-10-CM | POA: Diagnosis not present

## 2023-09-11 ENCOUNTER — Telehealth: Payer: Self-pay | Admitting: Internal Medicine

## 2023-09-11 DIAGNOSIS — M47812 Spondylosis without myelopathy or radiculopathy, cervical region: Secondary | ICD-10-CM

## 2023-09-11 NOTE — Telephone Encounter (Signed)
 Copied from CRM [id number]. Topic: Clinical - Medication Refill >> Sep 11, 2023 10:01 AM Alyse July wrote: Medication: oxyCODONE  (OXY IR/ROXICODONE ) 5 MG immediate release tablet   Has the patient contacted their pharmacy? Yes (Agent: If no, request that the patient contact the pharmacy for the refill. If patient does not wish to contact the pharmacy document the reason why and proceed with request.) (Agent: If yes, when and what did the pharmacy advise?)  This is the patient's preferred pharmacy:  [facility] [id number] [place], [place] - [address] [place] [address] Phone: [phone] Fax: [phone]  [facility] - [place], [place] - [address] [facility] [address] [place] [address] Phone: [phone] Fax: [phone]  Is this the correct pharmacy for this prescription? Yes If no, delete pharmacy and type the correct one.   Has the prescription been filled recently? Yes  Is the patient out of the medication? No  Has the patient been seen for an appointment in the last year OR does the patient have an upcoming appointment? Yes  Can we respond through MyChart? Yes  Agent: Please be advised that Rx refills may take up to 3 business days. We ask that you follow-up with your pharmacy.

## 2023-09-17 ENCOUNTER — Telehealth: Payer: Self-pay | Admitting: Internal Medicine

## 2023-09-17 ENCOUNTER — Encounter: Payer: Self-pay | Admitting: Physician Assistant

## 2023-09-17 ENCOUNTER — Ambulatory Visit: Admitting: Physician Assistant

## 2023-09-17 ENCOUNTER — Telehealth: Payer: Self-pay

## 2023-09-17 VITALS — BP 103/67

## 2023-09-17 DIAGNOSIS — L570 Actinic keratosis: Secondary | ICD-10-CM | POA: Diagnosis not present

## 2023-09-17 DIAGNOSIS — M47812 Spondylosis without myelopathy or radiculopathy, cervical region: Secondary | ICD-10-CM

## 2023-09-17 DIAGNOSIS — L578 Other skin changes due to chronic exposure to nonionizing radiation: Secondary | ICD-10-CM | POA: Diagnosis not present

## 2023-09-17 DIAGNOSIS — D1801 Hemangioma of skin and subcutaneous tissue: Secondary | ICD-10-CM

## 2023-09-17 DIAGNOSIS — L818 Other specified disorders of pigmentation: Secondary | ICD-10-CM

## 2023-09-17 DIAGNOSIS — W908XXA Exposure to other nonionizing radiation, initial encounter: Secondary | ICD-10-CM

## 2023-09-17 DIAGNOSIS — L816 Other disorders of diminished melanin formation: Secondary | ICD-10-CM

## 2023-09-17 DIAGNOSIS — D229 Melanocytic nevi, unspecified: Secondary | ICD-10-CM

## 2023-09-17 DIAGNOSIS — L821 Other seborrheic keratosis: Secondary | ICD-10-CM

## 2023-09-17 DIAGNOSIS — Z85828 Personal history of other malignant neoplasm of skin: Secondary | ICD-10-CM

## 2023-09-17 DIAGNOSIS — Z1283 Encounter for screening for malignant neoplasm of skin: Secondary | ICD-10-CM | POA: Diagnosis not present

## 2023-09-17 DIAGNOSIS — L814 Other melanin hyperpigmentation: Secondary | ICD-10-CM

## 2023-09-17 MED ORDER — MELOXICAM 15 MG PO TABS: 15.0000 mg | ORAL_TABLET | Freq: Every day | ORAL | 0 refills | Status: DC

## 2023-09-17 NOTE — Telephone Encounter (Signed)
 Ask her if she has stopped taking meds prescribed by the dentist

## 2023-09-17 NOTE — Telephone Encounter (Signed)
 Copied from CRM [id number]. Topic: Clinical - Medication Refill >> Sep 11, 2023 10:01 AM Alyse July wrote: Medication: oxyCODONE  (OXY IR/ROXICODONE ) 5 MG immediate release tablet   Has the patient contacted their pharmacy? Yes (Agent: If no, request that the patient contact the pharmacy for the refill. If patient does not wish to contact the pharmacy document the reason why and proceed with request.) (Agent: If yes, when and what did the pharmacy advise?)  This is the patient's preferred pharmacy:  [facility] [id number] [place], [place] - [address] [place] [address] Phone: [phone] Fax: [phone]  [facility] - [place], [place] - [address] [facility] [address] [place] [address] Phone: [phone] Fax: [phone]  Is this the correct pharmacy for this prescription? Yes If no, delete pharmacy and type the correct one.   Has the prescription been filled recently? Yes  Is the patient out of the medication? No  Has the patient been seen for an appointment in the last year OR does the patient have an upcoming appointment? Yes  Can we respond through MyChart? Yes  Agent: Please be advised that Rx refills may take up to 3 business days. We ask that you follow-up with your pharmacy. >> Sep 17, 2023  9:31 AM Antwanette L wrote: Patient is calling about a refill on oxyCODONE  (OXY IR/ROXICODONE ) 5 MG immediate release tablet . The refill was sent on 5/28. The patient is requesting a callback a [phone]

## 2023-09-17 NOTE — Telephone Encounter (Signed)
 Copied from CRM [id number]. Topic: Clinical - Medication Refill >> Sep 17, 2023  9:32 AM Antwanette L wrote: Medication: meloxicam  (MOBIC ) 15 MG tablet  Has the patient contacted their pharmacy? No  This is the patient's preferred pharmacy:  [facility] [id number] [place], [place] - [address] [place] [address] Phone: [phone] Fax: [phone]    Is this the correct pharmacy for this prescription? Yes   Has the prescription been filled recently? No. Last refilled on 07/23/23  Is the patient out of the medication? No.  Has the patient been seen for an appointment in the last year OR does the patient have an upcoming appointment? Yes. The patient has an ov on 7/29  Can we respond through MyChart? No. Contact the patient by phone at [phone]  Agent: Please be advised that Rx refills may take up to 3 business days. We ask that you follow-up with your pharmacy.

## 2023-09-17 NOTE — Progress Notes (Signed)
 Follow-Up Visit   Subjective  Dawn Jackson is a 53 y.o. female who presents for the following: Skin Cancer Screening and Full Body Skin Exam - History of BCC of left medial canthus as well as actinic keratoses.   The patient presents for Total-Body Skin Exam (TBSE) for skin cancer screening and mole check. The patient has spots, moles and lesions to be evaluated, some may be new or changing and the patient may have concern these could be cancer.    The following portions of the chart were reviewed this encounter and updated as appropriate: medications, allergies, medical history  Review of Systems:  No other skin or systemic complaints except as noted in HPI or Assessment and Plan.  Objective  Well appearing patient in no apparent distress; mood and affect are within normal limits.  A full examination was performed including scalp, head, eyes, ears, nose, lips, neck, chest, axillae, abdomen, back, buttocks, bilateral upper extremities, bilateral lower extremities, hands, feet, fingers, toes, fingernails, and toenails. All findings within normal limits unless otherwise noted below.   Relevant physical exam findings are noted in the Assessment and Plan.  Right Eyebrow, left nasal tip (2) Erythematous thin papules/macules with gritty scale.   Assessment & Plan   SKIN CANCER SCREENING PERFORMED TODAY.  ACTINIC DAMAGE - Chronic condition, secondary to cumulative UV/sun exposure - diffuse scaly erythematous macules with underlying dyspigmentation - Recommend daily broad spectrum sunscreen SPF 30+ to sun-exposed areas, reapply every 2 hours as needed.  - Staying in the shade or wearing long sleeves, sun glasses (UVA+UVB protection) and wide brim hats (4-inch brim around the entire circumference of the hat) are also recommended for sun protection.  - Call for new or changing lesions.  LENTIGINES, SEBORRHEIC KERATOSES, HEMANGIOMAS - Benign normal skin lesions - Benign-appearing - Call  for any changes  MELANOCYTIC NEVI - Tan-brown and/or pink-flesh-colored symmetric macules and papules - Benign appearing on exam today - Observation - Call clinic for new or changing moles - Recommend daily use of broad spectrum spf 30+ sunscreen to sun-exposed areas.   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  - Call if any new or changing lesions are noted between office visits     AK (ACTINIC KERATOSIS) (2) Right Eyebrow, left nasal tip (2) Destruction of lesion - Right Eyebrow, left nasal tip (2) Complexity: simple   Destruction method: cryotherapy   Informed consent: discussed and consent obtained   Timeout:  patient name, date of birth, surgical site, and procedure verified Lesion destroyed using liquid nitrogen: Yes   Region frozen until ice ball extended beyond lesion: Yes   Outcome: patient tolerated procedure well with no complications   Post-procedure details: wound care instructions given    Idiopathic Guttate Hypomelanosis (IGH)  Exam: scattered small hypopigmented macules  IGH is a benign chronic condition of sun-exposed skin, most commonly affecting the arms and legs. Cause is unknown but it can be a genetic trait. It is not related to vitiligo. There is no treatment. Recommend photoprotection and regular use of spf 30 or higher sunscreen which may help prevent the development of more white spots.  Treatment Plan:  Benign, observe.       Return in about 1 year (around 09/16/2024) for TBSE.  I, Eliot Guernsey, CMA, am acting as scribe  for Areona Homer K, PA-C .   Documentation: I have reviewed the above documentation for accuracy and completeness, and I agree with the above.  Ediel Unangst K, PA-C

## 2023-09-17 NOTE — Telephone Encounter (Signed)
 Copied from CRM [id number]. Topic: Clinical - Medication Refill >> Sep 17, 2023  1:13 PM Caliyah H wrote: Update Patient is requesting for the rx refill to be sent.  [facility] [id number] Jonette Nestle, [place] - [address]    [address]    [place] [address]    Phone: [phone] Fax: [phone]

## 2023-09-17 NOTE — Patient Instructions (Addendum)

## 2023-09-18 ENCOUNTER — Other Ambulatory Visit: Payer: Self-pay

## 2023-09-18 DIAGNOSIS — M47812 Spondylosis without myelopathy or radiculopathy, cervical region: Secondary | ICD-10-CM

## 2023-09-18 MED ORDER — OXYCODONE HCL 5 MG PO TABS
5.0000 mg | ORAL_TABLET | Freq: Every day | ORAL | 0 refills | Status: DC | PRN
Start: 2023-09-18 — End: 2023-09-25

## 2023-09-18 NOTE — Telephone Encounter (Signed)
 Medication refill has been sent to Dr. Yetta Barre

## 2023-09-18 NOTE — Telephone Encounter (Signed)
 Medication has been sent to Dr. Rochelle Chu. Patient gave a verbal understanding.

## 2023-09-18 NOTE — Telephone Encounter (Signed)
 Copied from CRM [id number]. Topic: Clinical - Prescription Issue >> Sep 18, 2023  9:16 AM Alyse July wrote: Reason for CRM: incoming call from patient to check on the status of medication refill.. in response to Dr. Rochelle Chu message Patient is no longer taken medication prescribed by her dentist.

## 2023-09-18 NOTE — Telephone Encounter (Signed)
 Copied from CRM [id number]. Topic: Clinical - Prescription Issue >> Sep 18, 2023  3:56 PM Aisha D wrote: Reason for CRM: Dawn Jackson with [facility] stated that he needs to speak with Dr.Jones regarding a request for the oxyCODONE . Dawn Jackson stated that the medication isn't due for a refill yet and still has 10 days left. Dawn Jackson is wanting to know if they need to refill the medication and why so early on the request. Call back number is [phone], hit [phone] to get directly to the pharmacy.

## 2023-09-19 NOTE — Telephone Encounter (Signed)
 Copied from CRM [id number]. Topic: Clinical - Prescription Issue >> Sep 18, 2023  3:56 PM Aisha D wrote: Reason for CRM: Lovena Rubinstein with [facility] stated that he needs to speak with Dr.Jones regarding a request for the oxyCODONE . Lovena Rubinstein stated that the medication isn't due for a refill yet and still has 10 days left. Lovena Rubinstein is wanting to know if they need to refill the medication and why so early on the request. Call back number is [phone], hit [phone] to get directly to the pharmacy. >> Sep 19, 2023  9:14 AM Allyne Areola wrote: Patient is calling to follow up, she wanted to verify that the pharmacy has contacted the office and she would like to know if there is anyway we can assist so she can pick up her oxyCODONE  (OXY IR/ROXICODONE ) 5 MG immediate release tablet , She will be leaving out of town on Tuesday 09/24/2023 and would like to have it before then.

## 2023-09-20 NOTE — Telephone Encounter (Signed)
 I need a verbal "okay" to fill this medication early and what day ?

## 2023-09-20 NOTE — Telephone Encounter (Signed)
 Copied from CRM [id number]. Topic: Clinical - Prescription Issue >> Sep 20, 2023 11:37 AM Dawn Jackson wrote: Patient is calling requesting an update on her medication, patient is asking if the doctor can send a note that this can be picked up early due to her going out of town on Tuesday and having extreme pain. Please contact patient with an update at [phone]

## 2023-09-20 NOTE — Telephone Encounter (Signed)
 This is a duplicate. I have called the patient left a message to return my call. Other telephone encounter has been sent to Dr Rochelle Chu to give the verbal okay to fill the script early.

## 2023-09-23 NOTE — Telephone Encounter (Signed)
 ok

## 2023-09-23 NOTE — Telephone Encounter (Signed)
 Copied from CRM [id number]. Topic: Clinical - Prescription Issue >> Sep 23, 2023  3:09 PM Turkey A wrote: Patient about refill request of oxyCODONE  (OXY IR/ROXICODONE ) 5 MG and would like fro Dr. Rochelle Chu to ok it for the early refill- Agent called [facility] was informed that still in the process of waiting for Primary to verbally approve refill

## 2023-09-23 NOTE — Telephone Encounter (Signed)
 Copied from CRM [id number]. Topic: Clinical - Prescription Issue >> Sep 23, 2023 12:40 PM Emmet Harm C wrote: Reason for CRM: Patient calling to check oxyCODONE  (OXY IR/ROXICODONE ) 5 MG immediate release tablet she needs it filled today leaving to go out of town today

## 2023-09-24 ENCOUNTER — Ambulatory Visit: Payer: 59 | Admitting: Dermatology

## 2023-09-24 NOTE — Telephone Encounter (Signed)
 Copied from CRM [id number]. Topic: Clinical - Prescription Issue >> Sep 24, 2023 10:32 AM Freya Jesus wrote: Reason for CRM: Patient called regarding prescription status for oxyCODONE  (OXY IR/ROXICODONE ) 5 MG immediate release tablet [[id number]] and said the pharmacy is still waiting for that verbal okay to process the prescription order. Patient is requesting a call back from Dr. Rochelle Chu nurse.  ---  Forwarding to DOD pool since [place] and CMA are out today

## 2023-09-24 NOTE — Telephone Encounter (Signed)
 Copied from CRM [id number]. Topic: Clinical - Prescription Issue >> Sep 24, 2023  1:57 PM Turkey A wrote: Dawn Jackson with [facility] called and said if there can be a new script sent with approval to fill 09/24/23 due to travel, then the prescription can be filled for patient  >> Sep 24, 2023  1:54 PM Allyne Areola wrote: Patient is calling to follow up on this request, She will be going out of town and the pharmacy have not gotten a response to fill the oxyCODONE  (OXY IR/ROXICODONE ) 5 MG prescription.

## 2023-09-25 ENCOUNTER — Other Ambulatory Visit: Payer: Self-pay | Admitting: Internal Medicine

## 2023-09-25 DIAGNOSIS — M47812 Spondylosis without myelopathy or radiculopathy, cervical region: Secondary | ICD-10-CM

## 2023-09-25 MED ORDER — OXYCODONE HCL 5 MG PO TABS
5.0000 mg | ORAL_TABLET | Freq: Every day | ORAL | 0 refills | Status: DC | PRN
Start: 2023-09-25 — End: 2023-10-24

## 2023-09-25 NOTE — Telephone Encounter (Signed)
 It look as if provider has already sent in medication today

## 2023-10-07 ENCOUNTER — Ambulatory Visit (AMBULATORY_SURGERY_CENTER)

## 2023-10-07 ENCOUNTER — Encounter: Payer: Self-pay | Admitting: Pediatrics

## 2023-10-07 VITALS — Ht 67.0 in | Wt 125.0 lb

## 2023-10-07 DIAGNOSIS — Z83719 Family history of colon polyps, unspecified: Secondary | ICD-10-CM

## 2023-10-07 DIAGNOSIS — K581 Irritable bowel syndrome with constipation: Secondary | ICD-10-CM

## 2023-10-07 DIAGNOSIS — K562 Volvulus: Secondary | ICD-10-CM

## 2023-10-07 MED ORDER — NA SULFATE-K SULFATE-MG SULF 17.5-3.13-1.6 GM/177ML PO SOLN: 1.0000 | Freq: Once | ORAL | 0 refills | Status: AC

## 2023-10-07 NOTE — Progress Notes (Signed)
 No egg or soy allergy known to patient  No issues known to pt with past sedation with any surgeries or procedures Patient denies ever being told they had issues or difficulty with intubation  No FH of Malignant Hyperthermia Pt is not on diet pills Pt is not on  home 02  Pt is not on blood thinners  IBS with chronic constipation  No A fib or A flutter Have any cardiac testing pending-- no LOA: independent  Prep: suprep    Patient's chart reviewed by Norleen Schillings CNRA prior to previsit and patient appropriate for the [facility].  Previsit completed and red dot placed by patient's name on their procedure day (on provider's schedule).     PV completed with patient. Prep instructions sent via mychart and home address.

## 2023-10-09 DIAGNOSIS — M6281 Muscle weakness (generalized): Secondary | ICD-10-CM | POA: Diagnosis not present

## 2023-10-09 DIAGNOSIS — F5231 Female orgasmic disorder: Secondary | ICD-10-CM | POA: Diagnosis not present

## 2023-10-09 DIAGNOSIS — R300 Dysuria: Secondary | ICD-10-CM | POA: Diagnosis not present

## 2023-10-09 DIAGNOSIS — M62838 Other muscle spasm: Secondary | ICD-10-CM | POA: Diagnosis not present

## 2023-10-09 DIAGNOSIS — M6289 Other specified disorders of muscle: Secondary | ICD-10-CM | POA: Diagnosis not present

## 2023-10-09 DIAGNOSIS — K5900 Constipation, unspecified: Secondary | ICD-10-CM | POA: Diagnosis not present

## 2023-10-10 DIAGNOSIS — F4389 Other reactions to severe stress: Secondary | ICD-10-CM | POA: Diagnosis not present

## 2023-10-11 ENCOUNTER — Ambulatory Visit: Admitting: Neurology

## 2023-10-15 DIAGNOSIS — F411 Generalized anxiety disorder: Secondary | ICD-10-CM | POA: Diagnosis not present

## 2023-10-15 DIAGNOSIS — F332 Major depressive disorder, recurrent severe without psychotic features: Secondary | ICD-10-CM | POA: Diagnosis not present

## 2023-10-15 DIAGNOSIS — F1910 Other psychoactive substance abuse, uncomplicated: Secondary | ICD-10-CM | POA: Diagnosis not present

## 2023-10-18 ENCOUNTER — Other Ambulatory Visit: Payer: Self-pay | Admitting: Medical Genetics

## 2023-10-19 NOTE — Progress Notes (Unsigned)
  Gastroenterology History and Physical   Primary Care Physician:  Joshua Debby CROME, MD   Reason for Procedure:  Colon cancer screening-family history of colorectal cancer in first-degree relative  Plan:    Screening colonoscopy     HPI: Dawn Jackson is a 53 y.o. female undergoing screening colonoscopy for colorectal cancer screening.  Patient's last colonoscopy was performed in 2020 and was normal.  Chart review indicates a history of colorectal cancer in the patient's mother.  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  Patient denies change in bowel habits or rectal bleeding at the time of this exam.   Past Medical History:  Diagnosis Date   Alcoholism (HCC)    Allergy    Anxiety    Basal cell carcinoma 2022   Left face - Mohs   Black stool 2017   Chronic headaches    Depression    DVT (deep venous thrombosis) (HCC) 2017   LLE, resolved   Nasal septum perforation    Orthostatic hypertension    PTSD (post-traumatic stress disorder)    Substance abuse (HCC)     Past Surgical History:  Procedure Laterality Date   ADENOIDECTOMY  1973   BREAST BIOPSY Left x2   BREAST BIOPSY Left 02/13/2022   US  LT BREAST BX W LOC DEV 1ST LESION IMG BX SPEC US  GUIDE 02/13/2022 [facility]   BREAST EXCISIONAL BIOPSY Left 2005   only one was surgical I think   COLON SURGERY  10/28/2020   COLONOSCOPY  12/2018   Dr Kristie Canon it was good   LAPAROTOMY N/A 10/26/2020   Procedure: EXPLORATORY LAPAROTOMY;  Surgeon: Signe Mitzie LABOR, MD;  Location: [facility];  Service: General;  Laterality: N/A;   PARTIAL COLECTOMY Right 10/26/2020   Procedure: PARTIAL COLECTOMY, ILEOCECECTOMY;  Surgeon: Signe Mitzie LABOR, MD;  Location: [facility];  Service: General;  Laterality: Right;   WISDOM TOOTH EXTRACTION      Prior to Admission medications   Medication Sig Start Date End Date Taking? Authorizing Provider  acetaminophen  (TYLENOL ) 500 MG tablet Take  1,000 mg by mouth 3 (three) times daily as needed for mild pain (pain score 1-3) or moderate pain (pain score 4-6).    [provider]  botulinum toxin Type A  (BOTOX ) 100 units SOLR injection Inject 300 units into patients head and neck every 90 per Dr. Evonnie 08/16/23   Evonnie Asberry RAMAN, DO  DULoxetine  (CYMBALTA ) 60 MG capsule Take 120 mg by mouth in the morning. 11/13/17   [provider]  Estradiol (DIVIGEL) 1 MG/GM GEL Apply 1 mg topically daily. 02/15/23   [provider]  fludrocortisone  (FLORINEF ) 0.1 MG tablet Take 1 tablet (0.1 mg total) by mouth daily. 05/09/23   Joshua Debby CROME, MD  gabapentin  (NEURONTIN ) 100 MG capsule Take 200 mg by mouth daily. Patient not taking: Reported on 10/07/2023    [provider]  hydrOXYzine  (ATARAX ) 50 MG tablet Take 50 mg by mouth 3 (three) times daily as needed. 07/18/23   [provider]  hydrOXYzine  (VISTARIL ) 50 MG capsule Take 50 mg by mouth as needed. 08/31/20   [provider]  ibuprofen  (ADVIL ) 800 MG tablet Take 800 mg by mouth daily as needed. Patient not taking: Reported on 10/07/2023 01/15/21   [provider]  meloxicam  (MOBIC ) 15 MG tablet Take 1 tablet (15 mg total) by mouth daily. 09/17/23   Joshua Debby CROME, MD  methocarbamol  (ROBAXIN ) 750 MG tablet  TAKE ONE TABLET BY MOUTH EVERY 8 HOURS AS NEEDED FOR MUSCLE SPASMS Patient taking differently: Take 750 mg by mouth every 8 (eight) hours as needed for muscle spasms. 06/18/23   Joshua Debby CROME, MD  oxyCODONE  (OXY IR/ROXICODONE ) 5 MG immediate release tablet Take 1 tablet (5 mg total) by mouth daily as needed for breakthrough pain. 09/25/23   Joshua Debby CROME, MD  traZODone  (DESYREL ) 100 MG tablet Take 100 mg by mouth at bedtime. Patient taking differently: Take 150 mg by mouth at bedtime.    [provider]  VALIUM 10 MG tablet 10 mg as needed. 08/13/23   [provider]    Current Outpatient Medications  Medication Sig Dispense  Refill   acetaminophen  (TYLENOL ) 500 MG tablet Take 1,000 mg by mouth 3 (three) times daily as needed for mild pain (pain score 1-3) or moderate pain (pain score 4-6).     botulinum toxin Type A  (BOTOX ) 100 units SOLR injection Inject 300 units into patients head and neck every 90 per Dr. Evonnie 3 each 1   DULoxetine  (CYMBALTA ) 60 MG capsule Take 120 mg by mouth in the morning.     Estradiol (DIVIGEL) 1 MG/GM GEL Apply 1 mg topically daily.     fludrocortisone  (FLORINEF ) 0.1 MG tablet Take 1 tablet (0.1 mg total) by mouth daily. 90 tablet 1   gabapentin  (NEURONTIN ) 100 MG capsule Take 200 mg by mouth daily. (Patient not taking: Reported on 10/07/2023)     hydrOXYzine  (ATARAX ) 50 MG tablet Take 50 mg by mouth 3 (three) times daily as needed.     hydrOXYzine  (VISTARIL ) 50 MG capsule Take 50 mg by mouth as needed.     ibuprofen  (ADVIL ) 800 MG tablet Take 800 mg by mouth daily as needed. (Patient not taking: Reported on 10/07/2023)     meloxicam  (MOBIC ) 15 MG tablet Take 1 tablet (15 mg total) by mouth daily. 90 tablet 0   methocarbamol  (ROBAXIN ) 750 MG tablet TAKE ONE TABLET BY MOUTH EVERY 8 HOURS AS NEEDED FOR MUSCLE SPASMS (Patient taking differently: Take 750 mg by mouth every 8 (eight) hours as needed for muscle spasms.) 90 tablet 0   oxyCODONE  (OXY IR/ROXICODONE ) 5 MG immediate release tablet Take 1 tablet (5 mg total) by mouth daily as needed for breakthrough pain. 30 tablet 0   traZODone  (DESYREL ) 100 MG tablet Take 100 mg by mouth at bedtime. (Patient taking differently: Take 150 mg by mouth at bedtime.)     VALIUM 10 MG tablet 10 mg as needed.     Current Facility-Administered Medications  Medication Dose Route Frequency Provider Last Rate Last Admin   incobotulinumtoxinA  (XEOMIN ) 100 units injection 200 Units  200 Units Intramuscular Q90 days Tat, Asberry RAMAN, DO   200 Units at 02/01/23 1319   incobotulinumtoxinA  (XEOMIN ) 100 units injection 200 Units  200 Units Intramuscular Q90 days Tat, Asberry RAMAN, DO   200 Units at 05/24/23 1016   incobotulinumtoxinA  (XEOMIN ) 50 units injection 50 Units  50 Units Intramuscular Q90 days Tat, Asberry RAMAN, DO   50 Units at 05/24/23 1017    Allergies as of 10/21/2023   (No Known Allergies)    Family History  Problem Relation Age of Onset   Cancer Mother        lung, non small cell carcinoma    Migraines Mother        like once a year when we were little   Colon polyps Mother    Heart disease Father  Cervical cancer Maternal Grandmother    COPD Maternal Grandfather    Cancer Paternal Grandmother    Breast cancer Paternal Grandmother 11   Healthy Child    Tremor Neg Hx    Esophageal cancer Neg Hx    Inflammatory bowel disease Neg Hx    Liver disease Neg Hx    Pancreatic cancer Neg Hx    Rectal cancer Neg Hx    Stomach cancer Neg Hx     Social History   Socioeconomic History   Marital status: Widowed    Spouse name: Not on file   Number of children: 1   Years of education: Not on file   Highest education level: Master's degree (e.g., MA, MS, MEng, MEd, MSW, MBA)  Occupational History   Not on file  Tobacco Use   Smoking status: Former    Current packs/day: 0.00    Average packs/day: 1 pack/day for 6.0 years (6.0 ttl pk-yrs)    Types: Cigarettes    Start date: 2003    Quit date: 2009    Years since quitting: 16.5   Smokeless tobacco: Never  Vaping Use   Vaping status: Never Used  Substance and Sexual Activity   Alcohol  use: Not Currently    Comment: Sobriety date 06/10/2018   Drug use: Not Currently    Types: Cocaine    Comment: quit 10/2014   Sexual activity: Not Currently    Partners: Male    Birth control/protection: I.U.D.  Other Topics Concern   Not on file  Social History Narrative   Husband passed AWAY   Right handed   Caffeine: 2 cups/day   Work from home   Social Drivers of Health   Financial Resource Strain: High Risk (04/12/2023)   Overall Financial Resource Strain (CARDIA)    Difficulty of Paying  Living Expenses: Hard  Food Insecurity: No Food Insecurity (04/12/2023)   Hunger Vital Sign    Worried About Running Out of Food in the Last Year: Never true    Ran Out of Food in the Last Year: Never true  Transportation Needs: No Transportation Needs (04/12/2023)   PRAPARE - Administrator, Civil Service (Medical): No    Lack of Transportation (Non-Medical): No  Physical Activity: Sufficiently Active (04/12/2023)   Exercise Vital Sign    Days of Exercise per Week: 4 days    Minutes of Exercise per Session: 50 min  Stress: No Stress Concern Present (04/12/2023)   [employer] of Occupational Health - Occupational Stress Questionnaire    Feeling of Stress : Only a little  Social Connections: Moderately Integrated (04/12/2023)   Social Connection and Isolation Panel    Frequency of Communication with Friends and Family: More than three times a week    Frequency of Social Gatherings with Friends and Family: Once a week    Attends Religious Services: 1 to 4 times per year    Active Member of [employer] or Organizations: Yes    Attends Banker Meetings: More than 4 times per year    Marital Status: Widowed  Intimate Partner Violence: Not on file    Review of Systems:  All other review of systems negative except as mentioned in the HPI.  Physical Exam: Vital signs There were no vitals taken for this visit.  General:   Alert,  Well-developed, well-nourished, pleasant and cooperative in NAD Airway:  Mallampati  Lungs:  Clear throughout to auscultation.   Heart:  Regular rate and rhythm; no murmurs,  clicks, rubs,  or gallops. Abdomen:  Soft, nontender and nondistended. Normal bowel sounds.   Neuro/Psych:  Normal mood and affect. A and O x 3  Inocente Hausen, MD [facility]

## 2023-10-21 ENCOUNTER — Ambulatory Visit (AMBULATORY_SURGERY_CENTER): Admitting: Pediatrics

## 2023-10-21 ENCOUNTER — Encounter: Payer: Self-pay | Admitting: Pediatrics

## 2023-10-21 VITALS — BP 104/73 | HR 53 | Temp 97.4°F | Resp 15 | Ht 67.0 in | Wt 125.0 lb

## 2023-10-21 DIAGNOSIS — K648 Other hemorrhoids: Secondary | ICD-10-CM | POA: Diagnosis not present

## 2023-10-21 DIAGNOSIS — Z980 Intestinal bypass and anastomosis status: Secondary | ICD-10-CM

## 2023-10-21 DIAGNOSIS — K581 Irritable bowel syndrome with constipation: Secondary | ICD-10-CM

## 2023-10-21 DIAGNOSIS — Z800 Family history of malignant neoplasm of digestive organs: Secondary | ICD-10-CM

## 2023-10-21 DIAGNOSIS — F419 Anxiety disorder, unspecified: Secondary | ICD-10-CM | POA: Diagnosis not present

## 2023-10-21 DIAGNOSIS — Z83719 Family history of colon polyps, unspecified: Secondary | ICD-10-CM

## 2023-10-21 DIAGNOSIS — F32A Depression, unspecified: Secondary | ICD-10-CM | POA: Diagnosis not present

## 2023-10-21 DIAGNOSIS — Z1211 Encounter for screening for malignant neoplasm of colon: Secondary | ICD-10-CM

## 2023-10-21 DIAGNOSIS — Q439 Congenital malformation of intestine, unspecified: Secondary | ICD-10-CM

## 2023-10-21 DIAGNOSIS — F4310 Post-traumatic stress disorder, unspecified: Secondary | ICD-10-CM | POA: Diagnosis not present

## 2023-10-21 MED ORDER — SODIUM CHLORIDE 0.9 % IV SOLN: 500.0000 mL | Freq: Once | INTRAVENOUS | Status: DC

## 2023-10-21 NOTE — Patient Instructions (Signed)

## 2023-10-21 NOTE — Op Note (Addendum)
 [facility] Patient Name: Dawn Jackson Procedure Date: 10/21/2023 8:42 AM MRN: [id number] Endoscopist: Inocente Hausen , MD, [id number] Age: 53 Referring MD:  Date of Birth: December 19, 1970 Gender: Female Account #: [id number] Procedure:                Colonoscopy Indications:              Colon cancer screening in patient at increased                            risk: Family history of 1st-degree relative with                            colon polyps, Last colonoscopy: 2020; patient's                            chart reports that her mother had colorectal                            cancer?"at today's visit, patient clarifies that her                            mother had colon polyps and lung cancer (not colon                            cancer) Medicines:                Monitored Anesthesia Care Procedure:                Pre-Anesthesia Assessment:                           - Prior to the procedure, a History and Physical                            was performed, and patient medications and                            allergies were reviewed. The patient's tolerance of                            previous anesthesia was also reviewed. The risks                            and benefits of the procedure and the sedation                            options and risks were discussed with the patient.                            All questions were answered, and informed consent                            was obtained. Prior Anticoagulants: The patient has  taken no anticoagulant or antiplatelet agents. ASA                            Grade Assessment: II - A patient with mild systemic                            disease. After reviewing the risks and benefits,                            the patient was deemed in satisfactory condition to                            undergo the procedure.                           After obtaining informed consent, the colonoscope                             was passed under direct vision. Throughout the                            procedure, the patient's blood pressure, pulse, and                            oxygen saturations were monitored continuously. The                            Olympus Scope SN: [id number] was introduced through                            the anus and advanced to the ileocolonic                            anastomosis. The colonoscopy was performed without                            difficulty. The patient tolerated the procedure                            well. The quality of the bowel preparation was                            good. The terminal ileum, ileocecal valve,                            appendiceal orifice, and rectum were photographed. Scope In: 8:53:58 AM Scope Out: 9:16:26 AM Scope Withdrawal Time: 0 hours 13 minutes 30 seconds  Total Procedure Duration: 0 hours 22 minutes 28 seconds  Findings:                 The perianal and digital rectal examinations were                            normal. Pertinent negatives include normal  sphincter tone and no palpable rectal lesions.                           The colon (entire examined portion) was moderately                            tortuous.                           There was evidence of a prior ileo-colonic                            anastomosis in the ascending colon. This was patent                            and was characterized by healthy appearing mucosa.                           The neo-terminal ileum appeared normal.                           The colon (entire examined portion) appeared normal.                           Internal hemorrhoids were found during retroflexion. Complications:            No immediate complications. Estimated blood loss:                            None. Estimated Blood Loss:     Estimated blood loss: none. Impression:               - Tortuous colon.                           - Patent  ileo-colonic anastomosis, characterized by                            healthy appearing mucosa.                           - The examined portion of the ileum was normal.                           - The entire examined colon is normal.                           - Internal hemorrhoids.                           - No specimens collected. Recommendation:           - Discharge patient to home (ambulatory).                           - Patient clarified her family history today noting  that her mother had lung cancer and not colorectal                            cancer. States that her mother had precancerous                            polyps . Recommend obtaining additional                            information about family history and mother's                            polyps if possible. If mother's colon polyps were                            advanced adenomas, repeat colonoscopy in 5 years.                            If mother's colon polyps were non-advanced                            adenomas, repeat colonoscopy in 10 years.                           - The findings and recommendations were discussed                            with the patient's family.                           - Return to referring physician.                           - Patient has a contact number available for                            emergencies. The signs and symptoms of potential                            delayed complications were discussed with the                            patient. Return to normal activities tomorrow.                            Written discharge instructions were provided to the                            patient. Inocente Hausen, MD 10/21/2023 9:23:53 AM This report has been signed electronically. Addendum Number: 1   Addendum Date: 10/21/2023 9:24:11 AM      Patient with history of cecal volvulus status post ileocecal resection       in 2022 Inocente Hausen,  MD 10/21/2023 9:24:27 AM This report has been signed electronically.

## 2023-10-21 NOTE — Progress Notes (Signed)
 Sedate, gd SR, tolerated procedure well, VSS, report to RN

## 2023-10-21 NOTE — Progress Notes (Signed)
 Pt's states no medical or surgical changes since previsit or office visit.

## 2023-10-22 ENCOUNTER — Telehealth: Payer: Self-pay

## 2023-10-22 DIAGNOSIS — F4389 Other reactions to severe stress: Secondary | ICD-10-CM | POA: Diagnosis not present

## 2023-10-22 NOTE — Telephone Encounter (Signed)
 Left message

## 2023-10-23 ENCOUNTER — Other Ambulatory Visit: Payer: Self-pay | Admitting: Internal Medicine

## 2023-10-23 DIAGNOSIS — Z1231 Encounter for screening mammogram for malignant neoplasm of breast: Secondary | ICD-10-CM

## 2023-10-24 ENCOUNTER — Other Ambulatory Visit: Payer: Self-pay | Admitting: Internal Medicine

## 2023-10-24 DIAGNOSIS — M47812 Spondylosis without myelopathy or radiculopathy, cervical region: Secondary | ICD-10-CM

## 2023-10-24 NOTE — Telephone Encounter (Signed)
 Copied from CRM [id number]. Topic: Clinical - Medication Refill >> Oct 24, 2023 12:40 PM Franky GRADE wrote: Medication: oxyCODONE  (OXY IR/ROXICODONE ) 5 MG immediate release tablet [[id number]]  Has the patient contacted their pharmacy? No (Agent: If no, request that the patient contact the pharmacy for the refill. If patient does not wish to contact the pharmacy document the reason why and proceed with request.) (Agent: If yes, when and what did the pharmacy advise?)  This is the patient's preferred pharmacy:  [facility] [id number] [place], [place] - [address] [place] [address] Phone: [phone] Fax: [phone]   Is this the correct pharmacy for this prescription? Yes If no, delete pharmacy and type the correct one.   Has the prescription been filled recently? No  Is the patient out of the medication? No  Has the patient been seen for an appointment in the last year OR does the patient have an upcoming appointment? Yes  Can we respond through MyChart? Yes  Agent: Please be advised that Rx refills may take up to 3 business days. We ask that you follow-up with your pharmacy.

## 2023-10-29 ENCOUNTER — Telehealth: Payer: Self-pay | Admitting: Internal Medicine

## 2023-10-29 MED ORDER — OXYCODONE HCL 5 MG PO TABS: 5.0000 mg | ORAL_TABLET | Freq: Every day | ORAL | 0 refills | Status: DC | PRN

## 2023-10-29 NOTE — Telephone Encounter (Signed)
 Copied from CRM [id number]. Topic: Clinical - Medication Refill >> Oct 29, 2023 10:10 AM Chasity T wrote: Medication: oxyCODONE  (OXY IR/ROXICODONE ) 5 MG immediate release tablet    Has the patient contacted their pharmacy? Yes    This is the patient's preferred pharmacy:  [facility] [id number] [place], [place] - [address] [place] [address] Phone: [phone] Fax: [phone]     Is this the correct pharmacy for this prescription? Yes If no, delete pharmacy and type the correct one.    Has the prescription been filled recently? No   Is the patient out of the medication? No   Has the patient been seen for an appointment in the last year OR does the patient have an upcoming appointment? Yes   Can we respond through MyChart? Yes   Agent: Please be advised that Rx refills may take up to 3 business days. We ask that you follow-up with your pharmacy.

## 2023-10-29 NOTE — Telephone Encounter (Signed)
 Refill sent today.

## 2023-10-31 ENCOUNTER — Other Ambulatory Visit

## 2023-10-31 DIAGNOSIS — Z006 Encounter for examination for normal comparison and control in clinical research program: Secondary | ICD-10-CM

## 2023-11-02 ENCOUNTER — Other Ambulatory Visit: Payer: Self-pay | Admitting: Internal Medicine

## 2023-11-02 DIAGNOSIS — I950 Idiopathic hypotension: Secondary | ICD-10-CM

## 2023-11-04 DIAGNOSIS — M62838 Other muscle spasm: Secondary | ICD-10-CM | POA: Diagnosis not present

## 2023-11-04 DIAGNOSIS — K5900 Constipation, unspecified: Secondary | ICD-10-CM | POA: Diagnosis not present

## 2023-11-04 DIAGNOSIS — M6289 Other specified disorders of muscle: Secondary | ICD-10-CM | POA: Diagnosis not present

## 2023-11-04 DIAGNOSIS — F5231 Female orgasmic disorder: Secondary | ICD-10-CM | POA: Diagnosis not present

## 2023-11-04 DIAGNOSIS — R309 Painful micturition, unspecified: Secondary | ICD-10-CM | POA: Diagnosis not present

## 2023-11-04 DIAGNOSIS — M6281 Muscle weakness (generalized): Secondary | ICD-10-CM | POA: Diagnosis not present

## 2023-11-07 DIAGNOSIS — F4389 Other reactions to severe stress: Secondary | ICD-10-CM | POA: Diagnosis not present

## 2023-11-08 ENCOUNTER — Ambulatory Visit
Admission: RE | Admit: 2023-11-08 | Discharge: 2023-11-08 | Disposition: A | Discharge status: Home / Self Care | Source: Ambulatory Visit | Attending: Internal Medicine | Admitting: Internal Medicine

## 2023-11-08 DIAGNOSIS — Z1231 Encounter for screening mammogram for malignant neoplasm of breast: Secondary | ICD-10-CM | POA: Diagnosis not present

## 2023-11-11 LAB — GENECONNECT MOLECULAR SCREEN: Genetic Analysis Overall Interpretation: NEGATIVE

## 2023-11-12 ENCOUNTER — Ambulatory Visit (INDEPENDENT_AMBULATORY_CARE_PROVIDER_SITE_OTHER): Admitting: Internal Medicine

## 2023-11-12 ENCOUNTER — Encounter: Payer: Self-pay | Admitting: Internal Medicine

## 2023-11-12 VITALS — BP 120/82 | HR 84 | Temp 98.3°F | Resp 16 | Ht 67.0 in | Wt 120.4 lb

## 2023-11-12 DIAGNOSIS — I950 Idiopathic hypotension: Secondary | ICD-10-CM | POA: Diagnosis not present

## 2023-11-12 DIAGNOSIS — Z79891 Long term (current) use of opiate analgesic: Secondary | ICD-10-CM

## 2023-11-12 DIAGNOSIS — Z86718 Personal history of other venous thrombosis and embolism: Secondary | ICD-10-CM | POA: Insufficient documentation

## 2023-11-12 DIAGNOSIS — E785 Hyperlipidemia, unspecified: Secondary | ICD-10-CM | POA: Diagnosis not present

## 2023-11-12 DIAGNOSIS — M47812 Spondylosis without myelopathy or radiculopathy, cervical region: Secondary | ICD-10-CM

## 2023-11-12 DIAGNOSIS — Z23 Encounter for immunization: Secondary | ICD-10-CM | POA: Diagnosis not present

## 2023-11-12 DIAGNOSIS — Z83719 Family history of colon polyps, unspecified: Secondary | ICD-10-CM | POA: Insufficient documentation

## 2023-11-12 DIAGNOSIS — G249 Dystonia, unspecified: Secondary | ICD-10-CM | POA: Insufficient documentation

## 2023-11-12 LAB — CBC WITH DIFFERENTIAL/PLATELET
Basophils Absolute: 0.1 K/uL (ref 0.0–0.1)
Basophils Relative: 1 % (ref 0.0–3.0)
Eosinophils Absolute: 0.2 K/uL (ref 0.0–0.7)
Eosinophils Relative: 4.6 % (ref 0.0–5.0)
HCT: 38.4 % (ref 36.0–46.0)
Hemoglobin: 13.3 g/dL (ref 12.0–15.0)
Lymphocytes Relative: 26.7 % (ref 12.0–46.0)
Lymphs Abs: 1.4 K/uL (ref 0.7–4.0)
MCHC: 34.5 g/dL (ref 30.0–36.0)
MCV: 92.3 fl (ref 78.0–100.0)
Monocytes Absolute: 0.6 K/uL (ref 0.1–1.0)
Monocytes Relative: 12.5 % — ABNORMAL HIGH (ref 3.0–12.0)
Neutro Abs: 2.9 K/uL (ref 1.4–7.7)
Neutrophils Relative %: 55.2 % (ref 43.0–77.0)
Platelets: 254 K/uL (ref 150.0–400.0)
RBC: 4.17 Mil/uL (ref 3.87–5.11)
RDW: 13.5 % (ref 11.5–15.5)
WBC: 5.2 K/uL (ref 4.0–10.5)

## 2023-11-12 LAB — BASIC METABOLIC PANEL WITH GFR
BUN: 7 mg/dL (ref 6–23)
CO2: 29 meq/L (ref 19–32)
Calcium: 9.6 mg/dL (ref 8.4–10.5)
Chloride: 105 meq/L (ref 96–112)
Creatinine, Ser: 0.77 mg/dL (ref 0.40–1.20)
GFR: 87.11 mL/min (ref >60.00)
Glucose, Bld: 97 mg/dL (ref 70–99)
Potassium: 4.2 meq/L (ref 3.5–5.1)
Sodium: 143 meq/L (ref 135–145)

## 2023-11-12 LAB — LIPID PANEL
Cholesterol: 190 mg/dL (ref 0–200)
HDL: 61.3 mg/dL (ref >39.00)
LDL Cholesterol: 115 mg/dL — ABNORMAL HIGH (ref 0–99)
NonHDL: 128.54
Total CHOL/HDL Ratio: 3
Triglycerides: 68 mg/dL (ref 0.0–149.0)
VLDL: 13.6 mg/dL (ref 0.0–40.0)

## 2023-11-12 NOTE — Patient Instructions (Addendum)
 You received your first Hepatitis B vaccine today. Please return for the second dose in one month.    Hypotension As your heart beats, it forces blood through your body. This force is called blood pressure. If you have hypotension, you have low blood pressure.  When your blood pressure is too low, you may not get enough blood to your brain or other parts of your body. This may cause you to feel weak, light-headed, have a fast heartbeat, or even faint. Low blood pressure may be harmless, or it may cause serious problems. What are the causes? Blood loss. Not enough water in the body (dehydration). Heart problems. Hormone problems. Pregnancy. A very bad infection. Not having enough of certain nutrients. Very bad allergic reactions. Certain medicines. What increases the risk? Age. The risk increases as you get older. Conditions that affect the heart or the brain and spinal cord (central nervous system). What are the signs or symptoms? Feeling: Weak. Light-headed. Dizzy. Tired (fatigued). Blurred vision. Fast heartbeat. Fainting, in very bad cases. How is this treated? Changing your diet. This may involve drinking more water or including more salt (sodium) in your diet by eating high-salt foods. Taking medicines to raise your blood pressure. Changing how much you take (the dosage) of some of your medicines. Wearing compression stockings. These stockings help to prevent blood clots and reduce swelling in your legs. In some cases, you may need to go to the hospital to: Receive fluids through an IV tube. Receive donated blood through an IV tube (transfusion). Get treated for an infection or heart problems, if this applies. Be monitored while medicines that you are taking wear off. Follow these instructions at home: Eating and drinking  Drink enough fluids to keep your pee (urine) pale yellow. Eat a healthy diet. Follow instructions from your doctor about what you can eat or drink.  A healthy diet includes: Fresh fruits and vegetables. Whole grains. Low-fat (lean) meats. Low-fat dairy products. If told, include more salt in your diet. Do not add extra salt to your diet unless your doctor tells you to. Eat small meals often. Avoid standing up quickly after you eat. Medicines Take over-the-counter and prescription medicines only as told by your doctor. Follow instructions from your doctor about changing how much you take of your medicines, if this applies. Do not stop or change any of your medicines on your own. General instructions  Wear compression stockings as told by your doctor. Get up slowly from lying down or sitting. Avoid hot showers and a lot of heat as told by your doctor. Return to your normal activities when your doctor says that it is safe. Do not smoke or use any products that contain nicotine  or tobacco. If you need help quitting, ask your doctor. Keep all follow-up visits. Contact a doctor if: You vomit. You have watery poop (diarrhea). You have a fever for more than 2-3 days. You feel more thirsty than normal. You feel weak and tired. Get help right away if: You have chest pain. You have a fast or uneven heartbeat. You lose feeling (have numbness) in any part of your body. You cannot move your arms or your legs. You have trouble talking. You get sweaty or feel light-headed. You faint. You have trouble breathing. You have trouble staying awake. You feel mixed up (confused). These symptoms may be an emergency. Get help right away. Call 911. Do not wait to see if the symptoms will go away. Do not drive yourself to the  hospital. Summary Hypotension is also called low blood pressure. It is when the force of blood pumping through your body is too weak. Hypotension may be harmless, or it may cause serious problems. Treatment may include changing your diet and medicines, and wearing compression stockings. In very bad cases, you may need to go  to the hospital. This information is not intended to replace advice given to you by your health care provider. Make sure you discuss any questions you have with your health care provider. Document Revised: 11/21/2020 Document Reviewed: 11/21/2020 Elsevier Patient Education  2024 [employer].

## 2023-11-12 NOTE — Progress Notes (Signed)
 "  Subjective:  Patient ID: Dawn Jackson, female    DOB: July 25, 1970  Age: 53 y.o. MRN: [id number]  CC: Medical Management of Chronic Issues (6 month follow up. )   HPI Dawn Jackson presents for f/up ----   Discussed the use of AI scribe software for clinical note transcription with the patient, who gave verbal consent to proceed.  History of Present Illness Dawn Jackson is a 53 year old female who presents for medication management and follow-up of chronic neck pain and blood pressure issues.  She experiences chronic neck pain that fluctuates in intensity, typically at a manageable level of 4 out of 10. During severe episodes, the pain necessitates medication. There is no radiation to her arms, and she does not experience numbness, weakness, or tingling. Her head continues to shake, and it has been a couple of months since her last Botox  injection.  She is on several medications including fludrocortisone , oxycodone , meloxicam , and occasionally Botox  injections. She also uses Valium vaginally, hydroxyzine  sometimes with trazodone  at night, and methocarbamol  occasionally. She has been consistent with taking fludrocortisone  and meloxicam  daily for the past month.  She denies weakness, dizziness, or lightheadedness. She did feel weak and dizzy recently due to not eating, but improved after eating. No recent episodes of presyncope.  She reports swollen ankles. She has started pelvic floor therapy, which she finds beneficial, and her bowel movements have been satisfactory this week.  She works as a hospital doctor for [employer], primarily in research scientist (medical), and finds the job demands stressful. She works 100% remotely.    Outpatient Medications Prior to Visit  Medication Sig Dispense Refill   acetaminophen  (TYLENOL ) 500 MG tablet Take 1,000 mg by mouth 3 (three) times daily as needed for mild pain (pain score 1-3) or moderate pain (pain score 4-6).     botulinum toxin Type A  (BOTOX ) 100 units  SOLR injection Inject 300 units into patients head and neck every 90 per Dr. Evonnie 3 each 1   DULoxetine  (CYMBALTA ) 60 MG capsule Take 120 mg by mouth in the morning.     Estradiol (DIVIGEL) 1 MG/GM GEL Apply 1 mg topically daily.     fludrocortisone  (FLORINEF ) 0.1 MG tablet TAKE 1 TABLET BY MOUTH DAILY 30 tablet 1   hydrOXYzine  (VISTARIL ) 50 MG capsule Take 50 mg by mouth as needed.     meloxicam  (MOBIC ) 15 MG tablet Take 1 tablet (15 mg total) by mouth daily. 90 tablet 0   methocarbamol  (ROBAXIN ) 750 MG tablet TAKE ONE TABLET BY MOUTH EVERY 8 HOURS AS NEEDED FOR MUSCLE SPASMS (Patient taking differently: Take 750 mg by mouth every 8 (eight) hours as needed for muscle spasms.) 90 tablet 0   oxyCODONE  (OXY IR/ROXICODONE ) 5 MG immediate release tablet Take 1 tablet (5 mg total) by mouth daily as needed for breakthrough pain. 30 tablet 0   testosterone cypionate (DEPO-TESTOSTERONE) 200 MG/ML injection Inject 200 mg into the muscle every 28 (twenty-eight) days.     traZODone  (DESYREL ) 100 MG tablet Take 100 mg by mouth at bedtime. (Patient taking differently: Take 150 mg by mouth at bedtime.)     VALIUM 10 MG tablet 10 mg as needed.     gabapentin  (NEURONTIN ) 100 MG capsule Take 200 mg by mouth daily. (Patient not taking: No sig reported)     hydrOXYzine  (ATARAX ) 50 MG tablet Take 50 mg by mouth 3 (three) times daily as needed.     ibuprofen  (ADVIL ) 800 MG tablet Take  800 mg by mouth daily as needed. (Patient not taking: No sig reported)     Facility-Administered Medications Prior to Visit  Medication Dose Route Frequency Provider Last Rate Last Admin   incobotulinumtoxinA  (XEOMIN ) 100 units injection 200 Units  200 Units Intramuscular Q90 days Tat, Asberry RAMAN, DO   200 Units at 02/01/23 1319   incobotulinumtoxinA  (XEOMIN ) 100 units injection 200 Units  200 Units Intramuscular Q90 days Tat, Asberry RAMAN, DO   200 Units at 05/24/23 1016   incobotulinumtoxinA  (XEOMIN ) 50 units injection 50 Units  50 Units  Intramuscular Q90 days Tat, Asberry RAMAN, DO   50 Units at 05/24/23 1017    ROS Review of Systems  Constitutional:  Negative for appetite change, chills, diaphoresis, fatigue and fever.  HENT: Negative.  Negative for trouble swallowing.   Eyes:  Negative for visual disturbance.  Respiratory:  Negative for cough, chest tightness, shortness of breath and wheezing.   Cardiovascular:  Negative for chest pain, palpitations and leg swelling.  Gastrointestinal:  Negative for abdominal pain, constipation, diarrhea, nausea and vomiting.  Genitourinary: Negative.  Negative for difficulty urinating.  Musculoskeletal:  Positive for neck pain. Negative for arthralgias and myalgias.  Neurological:  Positive for tremors. Negative for dizziness, weakness, light-headedness, numbness and headaches.  Hematological:  Negative for adenopathy. Does not bruise/bleed easily.  Psychiatric/Behavioral: Negative.      Objective:  BP 120/82 (BP Location: Left Arm, Patient Position: Sitting, Cuff Size: Normal)   Pulse 84   Temp 98.3 F (36.8 C) (Oral)   Resp 16   Ht 5' 7 (1.702 m)   Wt 120 lb 6.4 oz (54.6 kg)   SpO2 97%   BMI 18.86 kg/m   BP Readings from Last 3 Encounters:  11/12/23 120/82  10/21/23 104/73  09/17/23 103/67    Wt Readings from Last 3 Encounters:  11/12/23 120 lb 6.4 oz (54.6 kg)  10/21/23 125 lb (56.7 kg)  10/07/23 125 lb (56.7 kg)    Physical Exam Vitals reviewed.  Constitutional:      Appearance: She is not ill-appearing.  HENT:     Mouth/Throat:     Mouth: Mucous membranes are moist.  Eyes:     General: No scleral icterus.    Conjunctiva/sclera: Conjunctivae normal.  Cardiovascular:     Rate and Rhythm: Normal rate and regular rhythm.     Heart sounds: No murmur heard.    No friction rub. No gallop.  Pulmonary:     Effort: Pulmonary effort is normal.     Breath sounds: No stridor. No wheezing, rhonchi or rales.  Abdominal:     General: Abdomen is flat.     Palpations:  There is no mass.     Tenderness: There is no abdominal tenderness. There is no guarding.     Hernia: No hernia is present.  Musculoskeletal:        General: Normal range of motion.     Cervical back: Neck supple.     Right lower leg: No edema.     Left lower leg: No edema.  Lymphadenopathy:     Cervical: No cervical adenopathy.  Skin:    General: Skin is warm and dry.  Neurological:     General: No focal deficit present.     Mental Status: She is alert and oriented to person, place, and time.  Psychiatric:        Mood and Affect: Mood normal.        Behavior: Behavior normal.  Lab Results  Component Value Date   WBC 5.2 11/12/2023   HGB 13.3 11/12/2023   HCT 38.4 11/12/2023   PLT 254.0 11/12/2023   GLUCOSE 97 11/12/2023   CHOL 190 11/12/2023   TRIG 68.0 11/12/2023   HDL 61.30 11/12/2023   LDLCALC 115 (H) 11/12/2023   ALT 11 05/09/2023   AST 17 05/09/2023   NA 143 11/12/2023   K 4.2 11/12/2023   CL 105 11/12/2023   CREATININE 0.77 11/12/2023   BUN 7 11/12/2023   CO2 29 11/12/2023   TSH 1.09 05/09/2023   HGBA1C 5.0 10/12/2019    No results found.  Assessment & Plan:  Long-term current use of opiate analgesic- UDS is consistent. -     DRUG MONITORING, PANEL 7 WITH CONFIRMATION, URINE; Future  Spondylosis of cervical region without myelopathy or radiculopathy  Idiopathic hypotension- BP is normal on the current florinef  dose. -     Basic metabolic panel with GFR; Future -     CBC with Differential/Platelet; Future  Dyslipidemia, goal LDL below 130- Statin is not indicated. -     Lipid panel; Future  Immunization due -     Heplisav-B  (HepB-CPG) Vaccine  Other orders -     DM TEMPLATE     Follow-up: Return in about 4 months (around 03/14/2024).  Debby Molt, MD "

## 2023-11-14 LAB — DRUG MONITORING, PANEL 7 WITH CONFIRMATION, URINE
6 Acetylmorphine: NEGATIVE ng/mL (ref <10)
Alcohol Metabolites: NEGATIVE ng/mL (ref <500)
Amphetamines: NEGATIVE ng/mL (ref <500)
Barbiturates: NEGATIVE ng/mL (ref <300)
Benzodiazepines: NEGATIVE ng/mL (ref <100)
Cocaine Metabolite: NEGATIVE ng/mL (ref <150)
Creatinine: 24.6 mg/dL (ref >=20.0)
Marijuana Metabolite: NEGATIVE ng/mL (ref <20)
Methadone Metabolite: NEGATIVE ng/mL (ref <100)
Noroxycodone: 193 ng/mL — ABNORMAL HIGH (ref <50)
Opiates: NEGATIVE ng/mL (ref <100)
Oxidant: NEGATIVE ug/mL (ref <200)
Oxycodone: NEGATIVE ng/mL (ref <50)
Oxycodone: POSITIVE ng/mL — AB (ref <100)
Oxymorphone: 79 ng/mL — ABNORMAL HIGH (ref <50)
pH: 7.5 (ref 4.5–9.0)

## 2023-11-14 LAB — DM TEMPLATE

## 2023-11-15 ENCOUNTER — Ambulatory Visit: Payer: Self-pay | Admitting: Internal Medicine

## 2023-11-18 ENCOUNTER — Other Ambulatory Visit: Payer: Self-pay | Admitting: Internal Medicine

## 2023-11-18 DIAGNOSIS — M47812 Spondylosis without myelopathy or radiculopathy, cervical region: Secondary | ICD-10-CM

## 2023-11-18 NOTE — Telephone Encounter (Unsigned)
 Copied from CRM [id number]. Topic: Clinical - Medication Refill >> Nov 18, 2023 11:41 AM Aisha D wrote: Medication: oxyCODONE  (OXY IR/ROXICODONE ) 5 MG immediate release tablet  Has the patient contacted their pharmacy? Yes (Agent: If no, request that the patient contact the pharmacy for the refill. If patient does not wish to contact the pharmacy document the reason why and proceed with request.) (Agent: If yes, when and what did the pharmacy advise?)  This is the patient's preferred pharmacy:  [facility] [id number] [place], [place] - [address] [place] [address] Phone: [phone] Fax: [phone]  Is this the correct pharmacy for this prescription? Yes If no, delete pharmacy and type the correct one.   Has the prescription been filled recently? No  Is the patient out of the medication? No  Has the patient been seen for an appointment in the last year OR does the patient have an upcoming appointment? Yes  Can we respond through MyChart? Yes  Agent: Please be advised that Rx refills may take up to 3 business days. We ask that you follow-up with your pharmacy.

## 2023-11-19 DIAGNOSIS — F4389 Other reactions to severe stress: Secondary | ICD-10-CM | POA: Diagnosis not present

## 2023-11-22 MED ORDER — OXYCODONE HCL 5 MG PO TABS: 5.0000 mg | ORAL_TABLET | Freq: Every day | ORAL | 0 refills | Status: DC | PRN

## 2023-11-27 DIAGNOSIS — K5900 Constipation, unspecified: Secondary | ICD-10-CM | POA: Diagnosis not present

## 2023-11-27 DIAGNOSIS — M62838 Other muscle spasm: Secondary | ICD-10-CM | POA: Diagnosis not present

## 2023-11-27 DIAGNOSIS — R309 Painful micturition, unspecified: Secondary | ICD-10-CM | POA: Diagnosis not present

## 2023-11-27 DIAGNOSIS — M6281 Muscle weakness (generalized): Secondary | ICD-10-CM | POA: Diagnosis not present

## 2023-11-27 DIAGNOSIS — M6289 Other specified disorders of muscle: Secondary | ICD-10-CM | POA: Diagnosis not present

## 2023-12-04 ENCOUNTER — Other Ambulatory Visit (HOSPITAL_COMMUNITY): Payer: Self-pay

## 2023-12-06 ENCOUNTER — Ambulatory Visit: Admitting: Neurology

## 2023-12-06 ENCOUNTER — Telehealth: Payer: Self-pay | Admitting: Neurology

## 2023-12-06 ENCOUNTER — Other Ambulatory Visit: Payer: Self-pay

## 2023-12-06 MED ORDER — BOTOX 100 UNITS IJ SOLR: INTRAMUSCULAR | 1 refills | Status: AC

## 2023-12-06 NOTE — Telephone Encounter (Signed)
 Called [facility] and scheduled delivery for Tuesday called patient and left detailed message

## 2023-12-06 NOTE — Telephone Encounter (Signed)
 Pt called to see if we got her medication from [facility]. Her appt is 12/13/23 and she has not heard from anyone. She wants to know does she need to call [facility] or if [facility] is doing it. When you call her back, leave a detailed message if she doesn't answer. She is on vacation

## 2023-12-10 DIAGNOSIS — F4389 Other reactions to severe stress: Secondary | ICD-10-CM | POA: Diagnosis not present

## 2023-12-10 DIAGNOSIS — G243 Spasmodic torticollis: Secondary | ICD-10-CM | POA: Diagnosis not present

## 2023-12-11 DIAGNOSIS — M6281 Muscle weakness (generalized): Secondary | ICD-10-CM | POA: Diagnosis not present

## 2023-12-11 DIAGNOSIS — M62838 Other muscle spasm: Secondary | ICD-10-CM | POA: Diagnosis not present

## 2023-12-11 DIAGNOSIS — F5231 Female orgasmic disorder: Secondary | ICD-10-CM | POA: Diagnosis not present

## 2023-12-11 DIAGNOSIS — K5900 Constipation, unspecified: Secondary | ICD-10-CM | POA: Diagnosis not present

## 2023-12-11 DIAGNOSIS — M6289 Other specified disorders of muscle: Secondary | ICD-10-CM | POA: Diagnosis not present

## 2023-12-13 ENCOUNTER — Ambulatory Visit: Admitting: Neurology

## 2023-12-13 DIAGNOSIS — G243 Spasmodic torticollis: Secondary | ICD-10-CM

## 2023-12-13 MED ORDER — ONABOTULINUMTOXINA 100 UNITS IJ SOLR
300.0000 [IU] | Freq: Once | INTRAMUSCULAR | Status: AC
  Administered 2023-12-13: 250 [IU] via INTRAMUSCULAR

## 2023-12-13 NOTE — Procedures (Signed)
 [facility]   Procedure Note Botox   Attending: Dr. Asberry Khyler Eschmann  Preoperative Diagnosis(es): Cervical Dystonia  Result History  She thought it was more effective this time.  Current toxin: onabotulinumtoxinA   Previous toxin: Incobotulinumtoxin A (she did not find this is effective  Consent obtained from: The patient Benefits discussed included, but were not limited to decreased muscle tightness, increased joint range of motion, and decreased pain.  Risk discussed included, but were not limited pain and discomfort, bleeding, bruising, excessive weakness, venous thrombosis, muscle atrophy and dysphagia.  A copy of the patient medication guide was given to the patient which explains the blackbox warning.  Patients identity and treatment sites confirmed Yes.  .  Details of Procedure: Skin was cleaned with alcohol .  A 30 gauge, 25mm  needle was introduced to the target muscle, except for posterior splenius where 27 gauge, 1.5 inch needle used.   Prior to injection, the needle plunger was aspirated to make sure the needle was not within a blood vessel.  There was no blood retrieved on aspiration.    Following is a summary of the muscles injected  And the amount of onabotulinumtoxinA  used:   Dilution 0.9% preservative free saline mixed with 100 u onobotulinumtoxin A to make 10 U per 0.1cc  Injections  Location Left  Right Units Number of sites        Sternocleidomastoid 60  60 1  Splenius Capitus, posterior approach  100 100 1  Splenius Capitus, lateral approach  30 30 1   Levator Scapulae      Trapezius 20/10 20/10 60         TOTAL UNITS:   250    Agent: Onabotulinum toxin A  3 vials of onabotulinumtoxinA  and were used, each diluted with sterile, preservative-free saline to make 1unit/1cc  Total injected (Units): 250  Total wasted (Units): 50 units   Pt tolerated procedure well without complications.   Reinjection is anticipated in 3 months.

## 2023-12-23 ENCOUNTER — Telehealth: Payer: Self-pay

## 2023-12-23 ENCOUNTER — Other Ambulatory Visit: Payer: Self-pay | Admitting: Family

## 2023-12-23 ENCOUNTER — Other Ambulatory Visit: Payer: Self-pay | Admitting: Internal Medicine

## 2023-12-23 DIAGNOSIS — M47812 Spondylosis without myelopathy or radiculopathy, cervical region: Secondary | ICD-10-CM

## 2023-12-23 MED ORDER — COVID-19 MRNA VAC-TRIS(PFIZER) 30 MCG/0.3ML IM SUSY: 0.3000 mL | PREFILLED_SYRINGE | Freq: Once | INTRAMUSCULAR | 0 refills | Status: AC

## 2023-12-23 MED ORDER — OXYCODONE HCL 5 MG PO TABS: 5.0000 mg | ORAL_TABLET | Freq: Every day | ORAL | 0 refills | Status: DC | PRN

## 2023-12-23 NOTE — Telephone Encounter (Unsigned)
 Copied from CRM [id number]. Topic: Clinical - Medication Refill >> Dec 23, 2023 10:07 AM Carlyon D wrote: Medication: oxyCODONE  (OXY IR/ROXICODONE ) 5 MG immediate release tablet  Has the patient contacted their pharmacy? No (Agent: If no, request that the patient contact the pharmacy for the refill. If patient does not wish to contact the pharmacy document the reason why and proceed with request.) (Agent: If yes, when and what did the pharmacy advise?)  This is the patient's preferred pharmacy:  [facility] [id number] [place], [place] - [address] [place] [address] Phone: [phone] Fax: [phone]    Is this the correct pharmacy for this prescription? Yes If no, delete pharmacy and type the correct one.   Has the prescription been filled recently? No  Is the patient out of the medication? No, but will be by the 15th   Has the patient been seen for an appointment in the last year OR does the patient have an upcoming appointment? Yes  Can we respond through MyChart? Yes  Agent: Please be advised that Rx refills may take up to 3 business days. We ask that you follow-up with your pharmacy.

## 2023-12-23 NOTE — Telephone Encounter (Signed)
**Note De-identified  [place] Obfuscation** Please advise 

## 2023-12-23 NOTE — Telephone Encounter (Signed)
 Copied from CRM [id number]. Topic: Clinical - Request for Lab/Test Order >> Dec 23, 2023 10:09 AM Carlyon D wrote: Reason for CRM: Pt would like a script written for covid vaccine. [facility] preferred pharmacy. Please update pt when order is placed.

## 2023-12-25 ENCOUNTER — Ambulatory Visit: Payer: Self-pay

## 2023-12-25 NOTE — Telephone Encounter (Signed)
 FYI Only or Action Required?: FYI only for provider.  Patient was last seen in primary care on 11/12/2023 by Joshua Debby CROME, MD.  Called Nurse Triage reporting [place].  Symptoms began several days ago.  Interventions attempted: Ice/heat application.  Symptoms are: unchanged.  Triage Disposition: See Physician Within 24 Hours  Patient/caregiver understands and will follow disposition?: Yes   Copied from CRM #[id number]. Topic: Clinical - Red Word Triage >> Dec 25, 2023 10:21 AM Armenia J wrote: [facility] that prompted transfer to Nurse Triage: Patient has a cyst on her buttocks that is an inch from her anus. It hurts to sit and is swollen due to what she assumes is fluid inside. Reason for Disposition  [1] Boil > 1/2 inch across (> 12 mm; larger than a marble) AND [2] center is soft or pus colored  Answer Assessment - Initial Assessment Questions Additional info: 1) noticed lump on buttocks while showering yesterday, she has been applying moist heat and tried to squeeze to head. She is inquiring if this can be lanced, explained she would need assessment before determining treatment plan, verbalized understanding.     1. APPEARANCE of BOIL: What does the boil look like?      Large lump 2. LOCATION: Where is the boil located?      buttocks 3. NUMBER: How many boils are there?      1  4. SIZE: How big is the boil? (e.g., inches, cm; compare to size of a coin or other object)     Few inches 5. ONSET: When did the boil start?     Noticed yesterday 6. PAIN: Is there any pain? If Yes, ask: How bad is the pain?   (Scale 1-10; or mild, moderate, severe)     Painful with pressure 7. FEVER: Do you have a fever? If Yes, ask: What is it, how was it measured, and when did it start?      Denies  8. SOURCE: Have you been around anyone with boils or other Staph infections? Have you ever had boils before?      9. OTHER SYMPTOMS: Do you have any other symptoms? (e.g., shaking  chills, weakness, rash elsewhere on body)     none  Protocols used: Boil (Skin Abscess)-A-AH

## 2023-12-26 ENCOUNTER — Encounter: Payer: Self-pay | Admitting: Family Medicine

## 2023-12-26 ENCOUNTER — Ambulatory Visit (INDEPENDENT_AMBULATORY_CARE_PROVIDER_SITE_OTHER): Admitting: Family Medicine

## 2023-12-26 VITALS — BP 102/78 | HR 76 | Temp 98.2°F | Ht 67.0 in | Wt 125.0 lb

## 2023-12-26 DIAGNOSIS — L0231 Cutaneous abscess of buttock: Secondary | ICD-10-CM | POA: Diagnosis not present

## 2023-12-26 MED ORDER — METRONIDAZOLE 500 MG PO TABS: 500.0000 mg | ORAL_TABLET | Freq: Two times a day (BID) | ORAL | 0 refills | Status: AC

## 2023-12-26 MED ORDER — CEPHALEXIN 500 MG PO CAPS: 500.0000 mg | ORAL_CAPSULE | Freq: Two times a day (BID) | ORAL | 0 refills | Status: AC

## 2023-12-26 NOTE — Progress Notes (Signed)
   Acute Office Visit  Subjective:     Patient ID: Dawn Jackson, female    DOB: 1971/02/14, 53 y.o.   MRN: [id number]  Chief Complaint  Patient presents with   Acute Visit    HPI  Discussed the use of AI scribe software for clinical note transcription with the patient, who gave verbal consent to proceed.  History of Present Illness Dawn Jackson is a 53 year old female who presents with a painful lump near her ankle.  Painful buttock mass - Large, painful lump near to right buttock discovered two days ago - Discomfort is worse when sitting - Attempted to squeeze the lump without success - Has been applying a heating pad to the area - No fever, abd pain, nausea, vomiting, diarrhea, other symptoms     ROS Per HPI      Objective:    BP 102/78 (BP Location: Left Arm, Patient Position: Sitting)   Pulse 76   Temp 98.2 F (36.8 C) (Temporal)   Ht 5' 7 (1.702 m)   Wt 125 lb (56.7 kg)   SpO2 99%   BMI 19.58 kg/m    Physical Exam Vitals and nursing note reviewed. Exam conducted with a chaperone present Epifania Olden, CMA).  Constitutional:      General: She is not in acute distress.    Appearance: Normal appearance. She is normal weight.  HENT:     Head: Normocephalic and atraumatic.  Eyes:     Extraocular Movements: Extraocular movements intact.  Cardiovascular:     Rate and Rhythm: Normal rate.  Pulmonary:     Effort: Pulmonary effort is normal.  Genitourinary:     Comments: Area of cutaneous erythema, swelling, tenderness. No bleeding, no discharge, no area of induration Musculoskeletal:        General: Normal range of motion.     Cervical back: Normal range of motion.     Right lower leg: No edema.     Left lower leg: No edema.  Lymphadenopathy:     Cervical: No cervical adenopathy.  Neurological:     General: No focal deficit present.     Mental Status: She is alert and oriented to person, place, and time.  Psychiatric:        Mood and Affect: Mood  normal.        Thought Content: Thought content normal.     No results found for any visits on 12/26/23.      Assessment & Plan:   Assessment and Plan Assessment & Plan Cutaneous abscess of buttock Abscess not ready for drainage. - Prescribed Keflex  500 mg BID for 5 days. - Advised warm compresses. - Prescribed oral medication for bacterial vaginosis prophylaxis.     No orders of the defined types were placed in this encounter.    Meds ordered this encounter  Medications   cephALEXin  (KEFLEX ) 500 MG capsule    Sig: Take 1 capsule (500 mg total) by mouth 2 (two) times daily for 5 days.    Dispense:  10 capsule    Refill:  0   metroNIDAZOLE  (FLAGYL ) 500 MG tablet    Sig: Take 1 tablet (500 mg total) by mouth 2 (two) times daily for 7 days.    Dispense:  14 tablet    Refill:  0    Return if symptoms worsen or fail to improve.  Dawn LITTIE Ku, FNP

## 2023-12-26 NOTE — Patient Instructions (Signed)
 I have sent in keflex  for you to take twice a day for 5 days. Please eat when you take this medication, it can upset your stomach if you do not. Please be sure to complete the course of antibiotics even if you are feeling better.   If you develop BV, I have sent in metronidazole  for you to take one tablet twice a day for 7 days. If you do not develop BV after taking antibiotics you do not need to take this.   Follow-up with me for new or worsening symptoms.

## 2023-12-30 DIAGNOSIS — F332 Major depressive disorder, recurrent severe without psychotic features: Secondary | ICD-10-CM | POA: Diagnosis not present

## 2023-12-30 DIAGNOSIS — F1910 Other psychoactive substance abuse, uncomplicated: Secondary | ICD-10-CM | POA: Diagnosis not present

## 2023-12-30 DIAGNOSIS — F411 Generalized anxiety disorder: Secondary | ICD-10-CM | POA: Diagnosis not present

## 2024-01-03 ENCOUNTER — Other Ambulatory Visit: Payer: Self-pay | Admitting: Internal Medicine

## 2024-01-03 DIAGNOSIS — I950 Idiopathic hypotension: Secondary | ICD-10-CM

## 2024-01-15 DIAGNOSIS — K5909 Other constipation: Secondary | ICD-10-CM | POA: Diagnosis not present

## 2024-01-23 ENCOUNTER — Ambulatory Visit

## 2024-01-23 DIAGNOSIS — Z23 Encounter for immunization: Secondary | ICD-10-CM | POA: Diagnosis not present

## 2024-01-23 NOTE — Progress Notes (Signed)
 Pt was given Hep B vaccine with no complications.

## 2024-01-27 ENCOUNTER — Other Ambulatory Visit: Payer: Self-pay | Admitting: Internal Medicine

## 2024-01-27 DIAGNOSIS — M47812 Spondylosis without myelopathy or radiculopathy, cervical region: Secondary | ICD-10-CM

## 2024-01-27 NOTE — Telephone Encounter (Unsigned)
 Copied from CRM [id number]. Topic: Clinical - Medication Refill >> Jan 27, 2024  9:12 AM Kevelyn M wrote: Medication: oxyCODONE  (OXY IR/ROXICODONE ) 5 MG immediate release tablet  Has the patient contacted their pharmacy? Yes (Agent: If no, request that the patient contact the pharmacy for the refill. If patient does not wish to contact the pharmacy document the reason why and proceed with request.) (Agent: If yes, when and what did the pharmacy advise?)  This is the patient's preferred pharmacy:  [facility] [id number] [place], [place] - [address] [place] [address] Phone: [phone] Fax: [phone]  Is this the correct pharmacy for this prescription? Yes If no, delete pharmacy and type the correct one.   Has the prescription been filled recently? No  Is the patient out of the medication? Yes  Has the patient been seen for an appointment in the last year OR does the patient have an upcoming appointment? Yes  Can we respond through MyChart? Yes  Agent: Please be advised that Rx refills may take up to 3 business days. We ask that you follow-up with your pharmacy.

## 2024-01-28 DIAGNOSIS — F4389 Other reactions to severe stress: Secondary | ICD-10-CM | POA: Diagnosis not present

## 2024-01-29 DIAGNOSIS — K5909 Other constipation: Secondary | ICD-10-CM | POA: Diagnosis not present

## 2024-01-29 MED ORDER — OXYCODONE HCL 5 MG PO TABS
5.0000 mg | ORAL_TABLET | Freq: Every day | ORAL | 0 refills | Status: DC | PRN
Start: 2024-01-29 — End: 2024-02-24

## 2024-02-06 DIAGNOSIS — K5909 Other constipation: Secondary | ICD-10-CM | POA: Diagnosis not present

## 2024-02-06 DIAGNOSIS — M62838 Other muscle spasm: Secondary | ICD-10-CM | POA: Diagnosis not present

## 2024-02-06 DIAGNOSIS — M6281 Muscle weakness (generalized): Secondary | ICD-10-CM | POA: Diagnosis not present

## 2024-02-12 DIAGNOSIS — Z01419 Encounter for gynecological examination (general) (routine) without abnormal findings: Secondary | ICD-10-CM | POA: Diagnosis not present

## 2024-02-12 DIAGNOSIS — N952 Postmenopausal atrophic vaginitis: Secondary | ICD-10-CM | POA: Diagnosis not present

## 2024-02-12 DIAGNOSIS — Z681 Body mass index (BMI) 19 or less, adult: Secondary | ICD-10-CM | POA: Diagnosis not present

## 2024-02-13 DIAGNOSIS — Z13820 Encounter for screening for osteoporosis: Secondary | ICD-10-CM | POA: Diagnosis not present

## 2024-02-19 DIAGNOSIS — K5909 Other constipation: Secondary | ICD-10-CM | POA: Diagnosis not present

## 2024-02-19 DIAGNOSIS — M6281 Muscle weakness (generalized): Secondary | ICD-10-CM | POA: Diagnosis not present

## 2024-02-19 DIAGNOSIS — M62838 Other muscle spasm: Secondary | ICD-10-CM | POA: Diagnosis not present

## 2024-02-24 ENCOUNTER — Other Ambulatory Visit: Payer: Self-pay | Admitting: Internal Medicine

## 2024-02-24 DIAGNOSIS — M47812 Spondylosis without myelopathy or radiculopathy, cervical region: Secondary | ICD-10-CM

## 2024-02-24 NOTE — Telephone Encounter (Unsigned)
 Copied from CRM [id number]. Topic: Clinical - Medication Refill >> Feb 24, 2024  9:08 AM Lonell PEDLAR wrote: Medication: oxyCODONE  (OXY IR/ROXICODONE ) 5 MG immediate release tablet   Has the patient contacted their pharmacy? Yes, must call pcp  This is the patient's preferred pharmacy:  [facility] [id number] [place], [place] - [address] [place] [address] Phone: [phone] Fax: [phone]  Is this the correct pharmacy for this prescription? Yes If no, delete pharmacy and type the correct one.   Has the prescription been filled recently? Yes  Is the patient out of the medication? Yes  Has the patient been seen for an appointment in the last year OR does the patient have an upcoming appointment? Yes  Can we respond through MyChart? Yes  Agent: Please be advised that Rx refills may take up to 3 business days. We ask that you follow-up with your pharmacy.

## 2024-02-25 DIAGNOSIS — F4389 Other reactions to severe stress: Secondary | ICD-10-CM | POA: Diagnosis not present

## 2024-02-27 MED ORDER — OXYCODONE HCL 5 MG PO TABS: 5.0000 mg | ORAL_TABLET | Freq: Every day | ORAL | 0 refills | Status: DC | PRN

## 2024-03-03 DIAGNOSIS — M62838 Other muscle spasm: Secondary | ICD-10-CM | POA: Diagnosis not present

## 2024-03-03 DIAGNOSIS — K5909 Other constipation: Secondary | ICD-10-CM | POA: Diagnosis not present

## 2024-03-03 DIAGNOSIS — M6281 Muscle weakness (generalized): Secondary | ICD-10-CM | POA: Diagnosis not present

## 2024-03-05 DIAGNOSIS — G243 Spasmodic torticollis: Secondary | ICD-10-CM | POA: Diagnosis not present

## 2024-03-07 ENCOUNTER — Other Ambulatory Visit: Payer: Self-pay | Admitting: Internal Medicine

## 2024-03-07 DIAGNOSIS — I950 Idiopathic hypotension: Secondary | ICD-10-CM

## 2024-03-10 ENCOUNTER — Other Ambulatory Visit: Payer: Self-pay | Admitting: Internal Medicine

## 2024-03-10 DIAGNOSIS — F4389 Other reactions to severe stress: Secondary | ICD-10-CM | POA: Diagnosis not present

## 2024-03-10 DIAGNOSIS — M47812 Spondylosis without myelopathy or radiculopathy, cervical region: Secondary | ICD-10-CM

## 2024-03-16 DIAGNOSIS — F1910 Other psychoactive substance abuse, uncomplicated: Secondary | ICD-10-CM | POA: Diagnosis not present

## 2024-03-16 DIAGNOSIS — F411 Generalized anxiety disorder: Secondary | ICD-10-CM | POA: Diagnosis not present

## 2024-03-16 DIAGNOSIS — F332 Major depressive disorder, recurrent severe without psychotic features: Secondary | ICD-10-CM | POA: Diagnosis not present

## 2024-03-17 DIAGNOSIS — M62838 Other muscle spasm: Secondary | ICD-10-CM | POA: Diagnosis not present

## 2024-03-17 DIAGNOSIS — K5909 Other constipation: Secondary | ICD-10-CM | POA: Diagnosis not present

## 2024-03-17 DIAGNOSIS — M6281 Muscle weakness (generalized): Secondary | ICD-10-CM | POA: Diagnosis not present

## 2024-03-20 ENCOUNTER — Other Ambulatory Visit: Payer: Self-pay | Admitting: Internal Medicine

## 2024-03-20 DIAGNOSIS — M47812 Spondylosis without myelopathy or radiculopathy, cervical region: Secondary | ICD-10-CM

## 2024-03-20 NOTE — Telephone Encounter (Unsigned)
 Copied from CRM [id number]. Topic: Clinical - Medication Refill >> Mar 20, 2024 11:46 AM Mercedes MATSU wrote: Medication: oxyCODONE  (OXY IR/ROXICODONE ) 5 MG immediate release tablet  Has the patient contacted their pharmacy? Yes (Agent: If no, request that the patient contact the pharmacy for the refill. If patient does not wish to contact the pharmacy document the reason why and proceed with request.) (Agent: If yes, when and what did the pharmacy advise?)  This is the patient's preferred pharmacy:  [facility] [id number] [place], [place] - [address] [place] [place] [address] Phone: [phone] Fax: [phone]  Is this the correct pharmacy for this prescription? Yes If no, delete pharmacy and type the correct one.   Has the prescription been filled recently? Yes  Is the patient out of the medication? Yes  Has the patient been seen for an appointment in the last year OR does the patient have an upcoming appointment? Yes  Can we respond through MyChart? Yes  Agent: Please be advised that Rx refills may take up to 3 business days. We ask that you follow-up with your pharmacy.

## 2024-03-23 MED ORDER — OXYCODONE HCL 5 MG PO TABS: 5.0000 mg | ORAL_TABLET | Freq: Every day | ORAL | 0 refills | Status: DC | PRN

## 2024-03-27 ENCOUNTER — Ambulatory Visit: Admitting: Neurology

## 2024-03-31 DIAGNOSIS — M6281 Muscle weakness (generalized): Secondary | ICD-10-CM | POA: Diagnosis not present

## 2024-03-31 DIAGNOSIS — K5909 Other constipation: Secondary | ICD-10-CM | POA: Diagnosis not present

## 2024-03-31 DIAGNOSIS — M62838 Other muscle spasm: Secondary | ICD-10-CM | POA: Diagnosis not present

## 2024-04-03 ENCOUNTER — Ambulatory Visit: Admitting: Neurology

## 2024-04-03 DIAGNOSIS — G243 Spasmodic torticollis: Secondary | ICD-10-CM

## 2024-04-03 MED ORDER — ONABOTULINUMTOXINA 100 UNITS IJ SOLR
300.0000 [IU] | Freq: Once | INTRAMUSCULAR | Status: AC
  Administered 2024-04-03: 250 [IU] via INTRAMUSCULAR

## 2024-04-03 NOTE — Procedures (Deleted)
 [facility]   Procedure Note Botox   Attending: Dr. Asberry Hermina Barnard  Preoperative Diagnosis(es): Cervical Dystonia  Result History  She thought it was more effective this time.  Current toxin: onabotulinumtoxinA   Previous toxin: Incobotulinumtoxin A (she did not find this effective)  Consent obtained from: The patient Benefits discussed included, but were not limited to decreased muscle tightness, increased joint range of motion, and decreased pain.  Risk discussed included, but were not limited pain and discomfort, bleeding, bruising, excessive weakness, venous thrombosis, muscle atrophy and dysphagia.  A copy of the patient medication guide was given to the patient which explains the blackbox warning.  Patients identity and treatment sites confirmed Yes.  .  Details of Procedure: Skin was cleaned with alcohol .  A 30 gauge, 25mm  needle was introduced to the target muscle, except for posterior splenius where 27 gauge, 1.5 inch needle used.   Prior to injection, the needle plunger was aspirated to make sure the needle was not within a blood vessel.  There was no blood retrieved on aspiration.    Following is a summary of the muscles injected  And the amount of onabotulinumtoxinA  used:   Dilution 0.9% preservative free saline mixed with 100 u onobotulinumtoxin A to make 10 U per 0.1cc  Injections  Location Left  Right Units Number of sites        Sternocleidomastoid 60  60 1  Splenius Capitus, posterior approach  100 100 1  Splenius Capitus, lateral approach  30 30 1   Levator Scapulae      Trapezius 20/10 20/10 60         TOTAL UNITS:   250    Agent: Onabotulinum toxin A  3 vials of onabotulinumtoxinA  and were used, each diluted with sterile, preservative-free saline to make 1unit/1cc  Total injected (Units): 250  Total wasted (Units): 50 units   Pt tolerated procedure well without complications.   Reinjection is anticipated in 3 months.

## 2024-04-03 NOTE — Procedures (Signed)
 [facility]   Procedure Note Botox   Attending: Dr. Asberry Monserratt Knezevic  Preoperative Diagnosis(es): Cervical Dystonia  Result History  She thought it was effective this time but requests increasing dose next visit.  Current toxin: onabotulinumtoxinA   Previous toxin: Incobotulinumtoxin A (she did not find this is effective  Consent obtained from: The patient Benefits discussed included, but were not limited to decreased muscle tightness, increased joint range of motion, and decreased pain.  Risk discussed included, but were not limited pain and discomfort, bleeding, bruising, excessive weakness, venous thrombosis, muscle atrophy and dysphagia.  A copy of the patient medication guide was given to the patient which explains the blackbox warning.  Patients identity and treatment sites confirmed Yes.  .  Details of Procedure: Skin was cleaned with alcohol .  A 30 gauge, 25mm  needle was introduced to the target muscle, except for posterior splenius where 27 gauge, 1.5 inch needle used.   Prior to injection, the needle plunger was aspirated to make sure the needle was not within a blood vessel.  There was no blood retrieved on aspiration.    Following is a summary of the muscles injected  And the amount of onabotulinumtoxinA  used:   Dilution 0.9% preservative free saline mixed with 100 u onobotulinumtoxin A to make 10 U per 0.1cc  Injections  Location Left  Right Units Number of sites        Sternocleidomastoid 60  60 1  Splenius Capitus, posterior approach  100 100 1  Splenius Capitus, lateral approach  30 30 1   Levator Scapulae      Trapezius 20/10 20/10 60         TOTAL UNITS:   250    Agent: Onabotulinum toxin A  3 vials of onabotulinumtoxinA  and were used, each diluted with sterile, preservative-free saline to make 1unit/1cc  Total injected (Units): 250  Total wasted (Units): 50 units   Pt tolerated procedure well without complications.   Reinjection is anticipated in 3  months.

## 2024-04-07 DIAGNOSIS — F4389 Other reactions to severe stress: Secondary | ICD-10-CM | POA: Diagnosis not present

## 2024-04-17 ENCOUNTER — Other Ambulatory Visit: Payer: Self-pay | Admitting: Internal Medicine

## 2024-04-17 DIAGNOSIS — M47812 Spondylosis without myelopathy or radiculopathy, cervical region: Secondary | ICD-10-CM

## 2024-04-17 NOTE — Telephone Encounter (Signed)
 Copied from CRM [id number]. Topic: Clinical - Medication Refill >> Apr 17, 2024 12:18 PM Victoria A wrote: Medication: oxyCODONE  (OXY IR/ROXICODONE ) 5 MG immediate release tablet  Has the patient contacted their pharmacy? No (Agent: If no, request that the patient contact the pharmacy for the refill. If patient does not wish to contact the pharmacy document the reason why and proceed with request.) (Agent: If yes, when and what did the pharmacy advise?)  This is the patient's preferred pharmacy:  [facility] [id number] [place], [place] - [address] [place] [place] [address] Phone: [phone] Fax: [phone]  Is this the correct pharmacy for this prescription? Yes If no, delete pharmacy and type the correct one.   Has the prescription been filled recently? No  Is the patient out of the medication? No Has a couple left  Has the patient been seen for an appointment in the last year OR does the patient have an upcoming appointment? Yes  Can we respond through MyChart? Yes  Agent: Please be advised that Rx refills may take up to 3 business days. We ask that you follow-up with your pharmacy.

## 2024-04-23 MED ORDER — OXYCODONE HCL 5 MG PO TABS: 5.0000 mg | ORAL_TABLET | Freq: Every day | ORAL | 0 refills | Status: AC | PRN

## 2024-05-11 ENCOUNTER — Encounter: Payer: Self-pay | Admitting: Internal Medicine

## 2024-06-09 ENCOUNTER — Encounter: Payer: Self-pay | Admitting: Internal Medicine

## 2024-07-03 ENCOUNTER — Ambulatory Visit: Payer: Self-pay | Admitting: Neurology

## 2024-09-21 ENCOUNTER — Ambulatory Visit: Admitting: Physician Assistant
